# Patient Record
Sex: Female | Born: 1935
Health system: Southern US, Community
[De-identification: ages and names within clinical notes are randomized; demographics above are authoritative.]

## PROBLEM LIST (undated history)

## (undated) DIAGNOSIS — I10 Essential (primary) hypertension: Secondary | ICD-10-CM

## (undated) DIAGNOSIS — F419 Anxiety disorder, unspecified: Secondary | ICD-10-CM

## (undated) DIAGNOSIS — R519 Headache, unspecified: Secondary | ICD-10-CM

## (undated) DIAGNOSIS — F329 Major depressive disorder, single episode, unspecified: Secondary | ICD-10-CM

## (undated) DIAGNOSIS — F32A Depression, unspecified: Secondary | ICD-10-CM

## (undated) DIAGNOSIS — D691 Qualitative platelet defects: Secondary | ICD-10-CM

## (undated) DIAGNOSIS — R51 Headache: Secondary | ICD-10-CM

## (undated) DIAGNOSIS — K219 Gastro-esophageal reflux disease without esophagitis: Secondary | ICD-10-CM

## (undated) DIAGNOSIS — R32 Unspecified urinary incontinence: Secondary | ICD-10-CM

## (undated) DIAGNOSIS — M1990 Unspecified osteoarthritis, unspecified site: Secondary | ICD-10-CM

## (undated) DIAGNOSIS — M810 Age-related osteoporosis without current pathological fracture: Secondary | ICD-10-CM

## (undated) HISTORY — PX: APPENDECTOMY: SHX54

## (undated) HISTORY — DX: Age-related osteoporosis without current pathological fracture: M81.0

## (undated) HISTORY — PX: TONSILLECTOMY AND ADENOIDECTOMY: SHX28

## (undated) HISTORY — PX: SHOULDER SURGERY: SHX246

## (undated) HISTORY — DX: Headache: R51

## (undated) HISTORY — DX: Qualitative platelet defects: D69.1

## (undated) HISTORY — DX: Depression, unspecified: F32.A

## (undated) HISTORY — DX: Gastro-esophageal reflux disease without esophagitis: K21.9

## (undated) HISTORY — DX: Essential (primary) hypertension: I10

## (undated) HISTORY — PX: CERVICAL SPINE SURGERY: SHX589

## (undated) HISTORY — DX: Anxiety disorder, unspecified: F41.9

## (undated) HISTORY — DX: Unspecified urinary incontinence: R32

## (undated) HISTORY — DX: Major depressive disorder, single episode, unspecified: F32.9

## (undated) HISTORY — DX: Unspecified osteoarthritis, unspecified site: M19.90

## (undated) HISTORY — DX: Headache, unspecified: R51.9

## (undated) HISTORY — PX: BUNIONECTOMY: SHX129

---

## 2015-11-22 DIAGNOSIS — M5402 Panniculitis affecting regions of neck and back, cervical region: Secondary | ICD-10-CM | POA: Diagnosis not present

## 2015-11-22 DIAGNOSIS — M47812 Spondylosis without myelopathy or radiculopathy, cervical region: Secondary | ICD-10-CM | POA: Diagnosis not present

## 2015-11-22 DIAGNOSIS — M62830 Muscle spasm of back: Secondary | ICD-10-CM | POA: Diagnosis not present

## 2015-11-26 DIAGNOSIS — F329 Major depressive disorder, single episode, unspecified: Secondary | ICD-10-CM | POA: Diagnosis not present

## 2015-11-26 DIAGNOSIS — I10 Essential (primary) hypertension: Secondary | ICD-10-CM | POA: Diagnosis not present

## 2015-12-13 DIAGNOSIS — M5402 Panniculitis affecting regions of neck and back, cervical region: Secondary | ICD-10-CM | POA: Diagnosis not present

## 2015-12-13 DIAGNOSIS — M62830 Muscle spasm of back: Secondary | ICD-10-CM | POA: Diagnosis not present

## 2015-12-16 DIAGNOSIS — G894 Chronic pain syndrome: Secondary | ICD-10-CM | POA: Diagnosis not present

## 2015-12-16 DIAGNOSIS — M47812 Spondylosis without myelopathy or radiculopathy, cervical region: Secondary | ICD-10-CM | POA: Diagnosis not present

## 2015-12-16 DIAGNOSIS — M436 Torticollis: Secondary | ICD-10-CM | POA: Diagnosis not present

## 2015-12-16 DIAGNOSIS — M47892 Other spondylosis, cervical region: Secondary | ICD-10-CM | POA: Diagnosis not present

## 2015-12-16 DIAGNOSIS — M5402 Panniculitis affecting regions of neck and back, cervical region: Secondary | ICD-10-CM | POA: Diagnosis not present

## 2015-12-16 DIAGNOSIS — G248 Other dystonia: Secondary | ICD-10-CM | POA: Diagnosis not present

## 2015-12-16 DIAGNOSIS — M62830 Muscle spasm of back: Secondary | ICD-10-CM | POA: Diagnosis not present

## 2015-12-20 DIAGNOSIS — I10 Essential (primary) hypertension: Secondary | ICD-10-CM | POA: Diagnosis not present

## 2015-12-20 DIAGNOSIS — M542 Cervicalgia: Secondary | ICD-10-CM | POA: Diagnosis not present

## 2015-12-21 DIAGNOSIS — M62830 Muscle spasm of back: Secondary | ICD-10-CM | POA: Diagnosis not present

## 2015-12-21 DIAGNOSIS — M47812 Spondylosis without myelopathy or radiculopathy, cervical region: Secondary | ICD-10-CM | POA: Diagnosis not present

## 2015-12-27 DIAGNOSIS — S92351G Displaced fracture of fifth metatarsal bone, right foot, subsequent encounter for fracture with delayed healing: Secondary | ICD-10-CM | POA: Diagnosis not present

## 2016-01-10 DIAGNOSIS — M62838 Other muscle spasm: Secondary | ICD-10-CM | POA: Diagnosis not present

## 2016-01-10 DIAGNOSIS — M5481 Occipital neuralgia: Secondary | ICD-10-CM | POA: Diagnosis not present

## 2016-01-10 DIAGNOSIS — G243 Spasmodic torticollis: Secondary | ICD-10-CM | POA: Diagnosis not present

## 2016-01-10 DIAGNOSIS — M47892 Other spondylosis, cervical region: Secondary | ICD-10-CM | POA: Diagnosis not present

## 2016-01-10 DIAGNOSIS — M5402 Panniculitis affecting regions of neck and back, cervical region: Secondary | ICD-10-CM | POA: Diagnosis not present

## 2016-01-10 DIAGNOSIS — M47812 Spondylosis without myelopathy or radiculopathy, cervical region: Secondary | ICD-10-CM | POA: Diagnosis not present

## 2016-04-04 DIAGNOSIS — I10 Essential (primary) hypertension: Secondary | ICD-10-CM | POA: Diagnosis not present

## 2016-04-04 DIAGNOSIS — Z1231 Encounter for screening mammogram for malignant neoplasm of breast: Secondary | ICD-10-CM | POA: Diagnosis not present

## 2016-04-05 DIAGNOSIS — Z1231 Encounter for screening mammogram for malignant neoplasm of breast: Secondary | ICD-10-CM | POA: Diagnosis not present

## 2016-05-29 DIAGNOSIS — L814 Other melanin hyperpigmentation: Secondary | ICD-10-CM | POA: Diagnosis not present

## 2016-05-29 DIAGNOSIS — D1801 Hemangioma of skin and subcutaneous tissue: Secondary | ICD-10-CM | POA: Diagnosis not present

## 2016-06-28 DIAGNOSIS — M542 Cervicalgia: Secondary | ICD-10-CM | POA: Diagnosis not present

## 2016-06-28 DIAGNOSIS — F329 Major depressive disorder, single episode, unspecified: Secondary | ICD-10-CM | POA: Diagnosis not present

## 2016-06-28 DIAGNOSIS — I10 Essential (primary) hypertension: Secondary | ICD-10-CM | POA: Diagnosis not present

## 2016-06-28 LAB — CBC AND DIFFERENTIAL
HCT: 41 (ref 36–46)
Hemoglobin: 13.3 (ref 12.0–16.0)
Platelets: 162 (ref 150–399)
WBC: 7

## 2016-06-28 LAB — HEPATIC FUNCTION PANEL
ALT: 13 (ref 7–35)
AST: 21 (ref 13–35)
Alkaline Phosphatase: 47 (ref 25–125)
Bilirubin, Total: 0.7

## 2016-06-28 LAB — BASIC METABOLIC PANEL WITH GFR
BUN: 16 (ref 4–21)
Creatinine: 0.6 (ref 0.5–1.1)
Glucose: 92
Potassium: 4.7 (ref 3.4–5.3)
Sodium: 142 (ref 137–147)

## 2016-06-28 LAB — TSH: TSH: 1.32 (ref 0.41–5.90)

## 2016-07-26 DIAGNOSIS — I10 Essential (primary) hypertension: Secondary | ICD-10-CM | POA: Diagnosis not present

## 2016-07-26 DIAGNOSIS — J069 Acute upper respiratory infection, unspecified: Secondary | ICD-10-CM | POA: Diagnosis not present

## 2016-07-26 DIAGNOSIS — F329 Major depressive disorder, single episode, unspecified: Secondary | ICD-10-CM | POA: Diagnosis not present

## 2016-08-10 DIAGNOSIS — M47816 Spondylosis without myelopathy or radiculopathy, lumbar region: Secondary | ICD-10-CM | POA: Diagnosis not present

## 2016-08-10 DIAGNOSIS — M47812 Spondylosis without myelopathy or radiculopathy, cervical region: Secondary | ICD-10-CM | POA: Diagnosis not present

## 2016-08-16 DIAGNOSIS — Z23 Encounter for immunization: Secondary | ICD-10-CM | POA: Diagnosis not present

## 2016-09-07 DIAGNOSIS — L821 Other seborrheic keratosis: Secondary | ICD-10-CM | POA: Diagnosis not present

## 2016-10-11 DIAGNOSIS — F329 Major depressive disorder, single episode, unspecified: Secondary | ICD-10-CM | POA: Diagnosis not present

## 2016-10-11 DIAGNOSIS — I10 Essential (primary) hypertension: Secondary | ICD-10-CM | POA: Diagnosis not present

## 2016-10-11 DIAGNOSIS — L309 Dermatitis, unspecified: Secondary | ICD-10-CM | POA: Diagnosis not present

## 2016-10-11 DIAGNOSIS — H6121 Impacted cerumen, right ear: Secondary | ICD-10-CM | POA: Diagnosis not present

## 2016-10-11 DIAGNOSIS — M542 Cervicalgia: Secondary | ICD-10-CM | POA: Diagnosis not present

## 2017-02-08 DIAGNOSIS — F329 Major depressive disorder, single episode, unspecified: Secondary | ICD-10-CM | POA: Diagnosis not present

## 2017-02-08 DIAGNOSIS — I10 Essential (primary) hypertension: Secondary | ICD-10-CM | POA: Diagnosis not present

## 2017-02-08 DIAGNOSIS — M130 Polyarthritis, unspecified: Secondary | ICD-10-CM | POA: Diagnosis not present

## 2017-02-13 DIAGNOSIS — M1712 Unilateral primary osteoarthritis, left knee: Secondary | ICD-10-CM | POA: Diagnosis not present

## 2017-02-13 DIAGNOSIS — M25562 Pain in left knee: Secondary | ICD-10-CM | POA: Diagnosis not present

## 2017-03-07 DIAGNOSIS — H40023 Open angle with borderline findings, high risk, bilateral: Secondary | ICD-10-CM | POA: Diagnosis not present

## 2017-03-15 DIAGNOSIS — M1712 Unilateral primary osteoarthritis, left knee: Secondary | ICD-10-CM | POA: Diagnosis not present

## 2017-05-17 DIAGNOSIS — L570 Actinic keratosis: Secondary | ICD-10-CM | POA: Diagnosis not present

## 2017-05-17 DIAGNOSIS — R51 Headache: Secondary | ICD-10-CM | POA: Diagnosis not present

## 2017-05-17 DIAGNOSIS — F329 Major depressive disorder, single episode, unspecified: Secondary | ICD-10-CM | POA: Diagnosis not present

## 2017-05-17 DIAGNOSIS — I10 Essential (primary) hypertension: Secondary | ICD-10-CM | POA: Diagnosis not present

## 2017-05-17 DIAGNOSIS — Z1231 Encounter for screening mammogram for malignant neoplasm of breast: Secondary | ICD-10-CM | POA: Diagnosis not present

## 2017-05-17 DIAGNOSIS — M810 Age-related osteoporosis without current pathological fracture: Secondary | ICD-10-CM | POA: Diagnosis not present

## 2017-06-07 DIAGNOSIS — M25562 Pain in left knee: Secondary | ICD-10-CM | POA: Diagnosis not present

## 2017-06-07 DIAGNOSIS — M1712 Unilateral primary osteoarthritis, left knee: Secondary | ICD-10-CM | POA: Diagnosis not present

## 2017-06-11 DIAGNOSIS — L570 Actinic keratosis: Secondary | ICD-10-CM | POA: Diagnosis not present

## 2017-06-11 DIAGNOSIS — L821 Other seborrheic keratosis: Secondary | ICD-10-CM | POA: Diagnosis not present

## 2017-06-11 DIAGNOSIS — L814 Other melanin hyperpigmentation: Secondary | ICD-10-CM | POA: Diagnosis not present

## 2017-06-12 DIAGNOSIS — M810 Age-related osteoporosis without current pathological fracture: Secondary | ICD-10-CM | POA: Diagnosis not present

## 2017-06-12 DIAGNOSIS — Z1231 Encounter for screening mammogram for malignant neoplasm of breast: Secondary | ICD-10-CM | POA: Diagnosis not present

## 2017-06-12 DIAGNOSIS — M8589 Other specified disorders of bone density and structure, multiple sites: Secondary | ICD-10-CM | POA: Diagnosis not present

## 2017-06-12 LAB — HM MAMMOGRAPHY

## 2017-06-12 LAB — HM DEXA SCAN

## 2017-09-04 ENCOUNTER — Ambulatory Visit (INDEPENDENT_AMBULATORY_CARE_PROVIDER_SITE_OTHER): Payer: Medicare Other | Admitting: Adult Health

## 2017-09-04 ENCOUNTER — Encounter: Payer: Self-pay | Admitting: Adult Health

## 2017-09-04 VITALS — BP 146/64 | Temp 98.6°F | Ht 62.5 in | Wt 126.0 lb

## 2017-09-04 DIAGNOSIS — R51 Headache: Secondary | ICD-10-CM

## 2017-09-04 DIAGNOSIS — Z0000 Encounter for general adult medical examination without abnormal findings: Secondary | ICD-10-CM

## 2017-09-04 DIAGNOSIS — F32A Depression, unspecified: Secondary | ICD-10-CM | POA: Insufficient documentation

## 2017-09-04 DIAGNOSIS — F329 Major depressive disorder, single episode, unspecified: Secondary | ICD-10-CM | POA: Diagnosis not present

## 2017-09-04 DIAGNOSIS — F419 Anxiety disorder, unspecified: Secondary | ICD-10-CM

## 2017-09-04 DIAGNOSIS — Z23 Encounter for immunization: Secondary | ICD-10-CM | POA: Diagnosis not present

## 2017-09-04 DIAGNOSIS — K2100 Gastro-esophageal reflux disease with esophagitis, without bleeding: Secondary | ICD-10-CM

## 2017-09-04 DIAGNOSIS — R519 Headache, unspecified: Secondary | ICD-10-CM | POA: Insufficient documentation

## 2017-09-04 DIAGNOSIS — I10 Essential (primary) hypertension: Secondary | ICD-10-CM | POA: Diagnosis not present

## 2017-09-04 DIAGNOSIS — K219 Gastro-esophageal reflux disease without esophagitis: Secondary | ICD-10-CM | POA: Insufficient documentation

## 2017-09-04 MED ORDER — CALCIUM CARB-CHOLECALCIFEROL 600-800 MG-UNIT PO TABS: 2.0000 | ORAL_TABLET | Freq: Every day | ORAL | 3 refills | Status: DC

## 2017-09-04 MED ORDER — METOPROLOL SUCCINATE ER 50 MG PO TB24: 50.0000 mg | ORAL_TABLET | Freq: Every day | ORAL | 3 refills | Status: DC

## 2017-09-04 MED ORDER — ESCITALOPRAM OXALATE 20 MG PO TABS: 20.0000 mg | ORAL_TABLET | Freq: Every day | ORAL | 1 refills | Status: DC

## 2017-09-04 MED ORDER — MELOXICAM 15 MG PO TABS: 15.0000 mg | ORAL_TABLET | Freq: Every day | ORAL | 3 refills | Status: DC

## 2017-09-04 MED ORDER — BUTALBITAL-APAP-CAFFEINE 50-325-40 MG PO TABS: 1.0000 | ORAL_TABLET | Freq: Four times a day (QID) | ORAL | 0 refills | Status: DC | PRN

## 2017-09-04 MED ORDER — OMEPRAZOLE 20 MG PO CPDR: 20.0000 mg | DELAYED_RELEASE_CAPSULE | Freq: Every day | ORAL | 3 refills | Status: DC

## 2017-09-04 NOTE — Progress Notes (Signed)
Patient presents to clinic today to establish care. She is a pleasant 83 year old female who  has a past medical history of Anxiety and depression; GERD (gastroesophageal reflux disease); and Hypertension.   She recently moved from [place] to be closer to family.   She is unsure of when her last physical was.    Acute Concerns: Establish Care   Chronic Issues:  Anxiety and Depression - takes Lexapo 10 mg daily. She feels that since moving her anxiety and depression has become worse. She would like to go up on her dose   GERD - Prilosec 20 mg daily - stable   Hypertension - Controlled with Norvasc 5 mg and Toprol 50 mg - BP in the office today 146/64   Urinary Incontinence - has to use a pad at night   Health Maintenance: Dental -- Routine  Vision -- Routine  Immunizations -- UTD  Colonoscopy -- No longer needs Mammogram -- 2017  PAP -- No longer needs  Bone Density -- Unknown  Diet: She eats healthy  Exercise: She is active and walks 4 times a day with her dog.   Is followed by   - Dr. Alvan Dame - orthopedics    Past Medical History:  Diagnosis Date  . Anxiety and depression   . GERD (gastroesophageal reflux disease)   . Hypertension     Past Surgical History:  Procedure Laterality Date  . APPENDECTOMY    . BUNIONECTOMY    . CERVICAL SPINE SURGERY    . SHOULDER SURGERY    . TONSILLECTOMY AND ADENOIDECTOMY      No current outpatient prescriptions on file prior to visit.   No current facility-administered medications on file prior to visit.     Allergies  Allergen Reactions  . Penicillins Rash    Family History  Problem Relation Age of Onset  . Alcohol abuse Father     Social History   Social History  . Marital status: Widowed    Spouse name: N/A  . Number of children: N/A  . Years of education: N/A   Occupational History  . Not on file.   Social History Main Topics  . Smoking status: Former Smoker    Years: 10.00  . Smokeless  tobacco: Never Used  . Alcohol use 6.0 oz/week    10 Glasses of wine per week  . Drug use: Unknown  . Sexual activity: Not on file   Other Topics Concern  . Not on file   Social History Narrative  . No narrative on file    Review of Systems  Constitutional: Negative.   HENT: Negative.   Respiratory: Negative.   Cardiovascular: Negative.   Genitourinary: Negative.   Musculoskeletal: Positive for back pain, joint pain and neck pain. Negative for falls and myalgias.  Neurological: Negative.   Psychiatric/Behavioral: Negative.   All other systems reviewed and are negative.   BP (!) 146/64 (BP Location: Right Arm)   Temp 98.6 F (37 C) (Oral)   Ht 5' 2.5" (1.588 m)   Wt 126 lb (57.2 kg)   BMI 22.68 kg/m   Physical Exam  Constitutional: She is oriented to person, place, and time and well-developed, well-nourished, and in no distress. No distress.  Neck: Normal range of motion. Neck supple. No thyromegaly present.  Cardiovascular: Normal rate, regular rhythm, normal heart sounds and intact distal pulses.  Exam reveals no gallop and no friction rub.   No murmur heard. Pulmonary/Chest: Effort normal and breath  sounds normal. No respiratory distress. She has no wheezes. She has no rales. She exhibits no tenderness.  Musculoskeletal: Normal range of motion. She exhibits no edema or deformity.  Lymphadenopathy:    She has no cervical adenopathy.  Neurological: She is alert and oriented to person, place, and time. Gait normal. GCS score is 15.  Skin: Skin is warm and dry. No rash noted. She is not diaphoretic. No erythema. No pallor.  Nursing note and vitals reviewed.    Assessment/Plan: 1. Encounter for medical examination to establish care - Follow up for yearly exam  - Follow up sooner if needed - Continue to stay active and eat healthy   2. Essential hypertension - No change in medication at this tme  - amLODipine (NORVASC) 5 MG tablet; Take 5 mg by mouth daily. -  metoprolol succinate (TOPROL-XL) 50 MG 24 hr tablet; Take 1 tablet (50 mg total) by mouth daily. Take with or immediately following a meal.  Dispense: 90 tablet; Refill: 3  3. Need for prophylactic vaccination and inoculation against influenza  - Flu vaccine HIGH DOSE PF (Fluzone High dose)  4. Gastroesophageal reflux disease with esophagitis  - omeprazole (PRILOSEC) 20 MG capsule; Take 1 capsule (20 mg total) by mouth daily.  Dispense: 90 capsule; Refill: 3  5. Frequent headaches  - butalbital-acetaminophen-caffeine (FIORICET, ESGIC) 50-325-40 MG tablet; Take 1 tablet by mouth every 6 (six) hours as needed for headache. Take 1 tablet four times daily as needed  Dispense: 14 tablet; Refill: 0  6. Anxiety and depression - Increase Lexapro to 20 mg daily  - escitalopram (LEXAPRO) 20 MG tablet; Take 1 tablet (20 mg total) by mouth daily.  Dispense: 90 tablet; Refill: 1  Dorothyann Peng, NP

## 2017-09-17 DIAGNOSIS — H26493 Other secondary cataract, bilateral: Secondary | ICD-10-CM | POA: Diagnosis not present

## 2017-09-17 DIAGNOSIS — Z961 Presence of intraocular lens: Secondary | ICD-10-CM | POA: Diagnosis not present

## 2017-09-17 DIAGNOSIS — H524 Presbyopia: Secondary | ICD-10-CM | POA: Diagnosis not present

## 2017-09-17 DIAGNOSIS — H5213 Myopia, bilateral: Secondary | ICD-10-CM | POA: Diagnosis not present

## 2017-09-26 ENCOUNTER — Encounter: Payer: Self-pay | Admitting: Family Medicine

## 2017-10-03 ENCOUNTER — Ambulatory Visit: Payer: Medicare Other | Admitting: Adult Health

## 2017-10-04 DIAGNOSIS — G8929 Other chronic pain: Secondary | ICD-10-CM | POA: Diagnosis not present

## 2017-10-04 DIAGNOSIS — M1712 Unilateral primary osteoarthritis, left knee: Secondary | ICD-10-CM | POA: Diagnosis not present

## 2017-10-04 DIAGNOSIS — M25562 Pain in left knee: Secondary | ICD-10-CM | POA: Diagnosis not present

## 2017-10-05 ENCOUNTER — Encounter: Payer: Self-pay | Admitting: Adult Health

## 2017-10-05 ENCOUNTER — Other Ambulatory Visit: Payer: Self-pay | Admitting: Adult Health

## 2017-10-05 ENCOUNTER — Ambulatory Visit (INDEPENDENT_AMBULATORY_CARE_PROVIDER_SITE_OTHER): Payer: Medicare Other | Admitting: Adult Health

## 2017-10-05 VITALS — BP 126/60 | Temp 98.2°F | Wt 126.0 lb

## 2017-10-05 DIAGNOSIS — F329 Major depressive disorder, single episode, unspecified: Secondary | ICD-10-CM | POA: Diagnosis not present

## 2017-10-05 DIAGNOSIS — Z760 Encounter for issue of repeat prescription: Secondary | ICD-10-CM

## 2017-10-05 DIAGNOSIS — R51 Headache: Secondary | ICD-10-CM | POA: Diagnosis not present

## 2017-10-05 DIAGNOSIS — E018 Other iodine-deficiency related thyroid disorders and allied conditions: Secondary | ICD-10-CM

## 2017-10-05 DIAGNOSIS — R7989 Other specified abnormal findings of blood chemistry: Secondary | ICD-10-CM

## 2017-10-05 DIAGNOSIS — F419 Anxiety disorder, unspecified: Secondary | ICD-10-CM | POA: Diagnosis not present

## 2017-10-05 DIAGNOSIS — I10 Essential (primary) hypertension: Secondary | ICD-10-CM | POA: Diagnosis not present

## 2017-10-05 DIAGNOSIS — R519 Headache, unspecified: Secondary | ICD-10-CM

## 2017-10-05 DIAGNOSIS — F32A Depression, unspecified: Secondary | ICD-10-CM

## 2017-10-05 LAB — CBC WITH DIFFERENTIAL/PLATELET
BASOS ABS: 0 10*3/uL (ref 0.0–0.1)
Basophils Relative: 0.2 % (ref 0.0–3.0)
Eosinophils Absolute: 0 10*3/uL (ref 0.0–0.7)
Eosinophils Relative: 0 % (ref 0.0–5.0)
HCT: 40.6 % (ref 36.0–46.0)
Hemoglobin: 13.5 g/dL (ref 12.0–15.0)
LYMPHS ABS: 0.8 10*3/uL (ref 0.7–4.0)
Lymphocytes Relative: 12.1 % (ref 12.0–46.0)
MCHC: 33.2 g/dL (ref 30.0–36.0)
MCV: 99.4 fl (ref 78.0–100.0)
MONO ABS: 0.2 10*3/uL (ref 0.1–1.0)
MONOS PCT: 3.1 % (ref 3.0–12.0)
NEUTROS PCT: 84.6 % — AB (ref 43.0–77.0)
Neutro Abs: 5.5 10*3/uL (ref 1.4–7.7)
Platelets: 143 10*3/uL — ABNORMAL LOW (ref 150.0–400.0)
RBC: 4.08 Mil/uL (ref 3.87–5.11)
RDW: 13.1 % (ref 11.5–15.5)
WBC: 6.5 10*3/uL (ref 4.0–10.5)

## 2017-10-05 LAB — LIPID PANEL
CHOL/HDL RATIO: 2
Cholesterol: 266 mg/dL — ABNORMAL HIGH (ref 0–200)
HDL: 120.4 mg/dL (ref >39.00)
LDL Cholesterol: 134 mg/dL — ABNORMAL HIGH (ref 0–99)
NONHDL: 145.51
Triglycerides: 59 mg/dL (ref 0.0–149.0)
VLDL: 11.8 mg/dL (ref 0.0–40.0)

## 2017-10-05 LAB — BASIC METABOLIC PANEL
BUN: 26 mg/dL — ABNORMAL HIGH (ref 6–23)
CHLORIDE: 103 meq/L (ref 96–112)
CO2: 28 meq/L (ref 19–32)
Calcium: 9.6 mg/dL (ref 8.4–10.5)
Creatinine, Ser: 0.75 mg/dL (ref 0.40–1.20)
GFR: 78.76 mL/min (ref >60.00)
Glucose, Bld: 127 mg/dL — ABNORMAL HIGH (ref 70–99)
POTASSIUM: 4.8 meq/L (ref 3.5–5.1)
SODIUM: 141 meq/L (ref 135–145)

## 2017-10-05 LAB — HEPATIC FUNCTION PANEL
ALK PHOS: 47 U/L (ref 39–117)
ALT: 16 U/L (ref 0–35)
AST: 23 U/L (ref 0–37)
Albumin: 4.5 g/dL (ref 3.5–5.2)
BILIRUBIN DIRECT: 0.1 mg/dL (ref 0.0–0.3)
TOTAL PROTEIN: 6.7 g/dL (ref 6.0–8.3)
Total Bilirubin: 0.5 mg/dL (ref 0.2–1.2)

## 2017-10-05 LAB — TSH: TSH: 0.33 u[IU]/mL — ABNORMAL LOW (ref 0.35–4.50)

## 2017-10-05 MED ORDER — AMLODIPINE BESYLATE 5 MG PO TABS: 5.0000 mg | ORAL_TABLET | Freq: Every day | ORAL | 3 refills | Status: DC

## 2017-10-05 MED ORDER — BUTALBITAL-APAP-CAFFEINE 50-325-40 MG PO TABS: 1.0000 | ORAL_TABLET | Freq: Four times a day (QID) | ORAL | 0 refills | Status: DC | PRN

## 2017-10-05 MED ORDER — MIRTAZAPINE 15 MG PO TABS: 15.0000 mg | ORAL_TABLET | Freq: Every day | ORAL | 1 refills | Status: DC

## 2017-10-05 MED ORDER — ESCITALOPRAM OXALATE 20 MG PO TABS: 20.0000 mg | ORAL_TABLET | Freq: Every day | ORAL | 1 refills | Status: DC

## 2017-10-05 NOTE — Progress Notes (Signed)
Subjective:    Patient ID: Sheila Wolf, female    DOB: 07-26-1934, 83 y.o.   MRN: [id number]  HPI  83 year old female who  has a past medical history of Anxiety and depression, Arthritis, Frequent headaches, GERD (gastroesophageal reflux disease), Hypertension, Thrombocytopathia ([place]), and Urinary incontinence. She presents to the office today for her yearly follow up exam    Regarding anxiety and depression. During her last visit she had just moved from [place] to be closer to her family. The move had caused increase stress which increased her anxiety and depression. The decision to was made to increase Lexapro from 10 mg to 20 mg Today in the office she reports that she has been feeling improved, she has not experienced any side effects from increasing this medication  For control of blood pressure she takes Norvasc 5 mg daily and Toporol 25 mg  All immunizations and health maintenance protocols were reviewed with the patient and needed orders were placed.  Appropriate screening laboratory values were ordered for the patient including screening of hyperlipidemia, renal function and hepatic function.  Medication reconciliation,  past medical history, social history, problem list and allergies were reviewed in detail with the patient  Goals were established with regard to weight loss, exercise, and  diet in compliance with medications  End of life planning was discussed. She has an advanced directive and living will.   She has no acute complaints today      Review of Systems  Constitutional: Negative.   Eyes: Negative.   Respiratory: Negative.   Cardiovascular: Negative.   Gastrointestinal: Negative.   Endocrine: Negative.   Genitourinary: Negative.   Musculoskeletal: Positive for arthralgias and back pain.  Skin: Negative.   Allergic/Immunologic: Negative.   Neurological: Negative.   Hematological: Negative.   All other systems reviewed and are  negative.     Past Medical History:  Diagnosis Date  . Anxiety and depression   . Arthritis   . Frequent headaches   . GERD (gastroesophageal reflux disease)   . Hypertension   . Thrombocytopathia ([place])   . Urinary incontinence     Social History   Socioeconomic History  . Marital status: Widowed    Spouse name: Not on file  . Number of children: Not on file  . Years of education: Not on file  . Highest education level: Not on file  Social Needs  . Financial resource strain: Not on file  . Food insecurity - worry: Not on file  . Food insecurity - inability: Not on file  . Transportation needs - medical: Not on file  . Transportation needs - non-medical: Not on file  Occupational History  . Not on file  Tobacco Use  . Smoking status: Former Smoker    Years: 10.00    Types: Cigarettes  . Smokeless tobacco: Never Used  Substance and Sexual Activity  . Alcohol use: Yes    Alcohol/week: 6.0 oz    Types: 10 Glasses of wine per week  . Drug use: No  . Sexual activity: No  Other Topics Concern  . Not on file  Social History Narrative  . Not on file    Past Surgical History:  Procedure Laterality Date  . APPENDECTOMY    . BUNIONECTOMY    . CERVICAL SPINE SURGERY    . SHOULDER SURGERY    . TONSILLECTOMY AND ADENOIDECTOMY      Family History  Problem Relation Age of Onset  . Arthritis Mother   .  Hearing loss Mother   . Heart disease Mother   . Hypertension Mother   . Alcohol abuse Father   . Early death Father   . Heart disease Father   . Stroke Father   . Birth defects Brother   . Heart disease Brother     Allergies  Allergen Reactions  . Penicillins Rash    Current Outpatient Medications on File Prior to Visit  Medication Sig Dispense Refill  . amLODipine (NORVASC) 5 MG tablet Take 5 mg by mouth daily.    . butalbital-acetaminophen-caffeine (FIORICET, ESGIC) 50-325-40 MG tablet Take 1 tablet by mouth every 6 (six) hours as needed for headache.  Take 1 tablet four times daily as needed 14 tablet 0  . Calcium Carb-Cholecalciferol (CALTRATE 600+D) 600-800 MG-UNIT TABS Take 2 tablets by mouth daily. 60 tablet 3  . cetirizine (ZYRTEC) 10 MG tablet Take 10 mg by mouth daily.    Marland Kitchen escitalopram (LEXAPRO) 20 MG tablet Take 1 tablet (20 mg total) by mouth daily. (Patient taking differently: Take 40 mg daily by mouth. ) 90 tablet 1  . Fluticasone Furoate 50 MCG/ACT AEPB Inhale into the lungs.    . meloxicam (MOBIC) 15 MG tablet Take 1 tablet (15 mg total) by mouth daily. 90 tablet 3  . metoprolol succinate (TOPROL-XL) 50 MG 24 hr tablet Take 1 tablet (50 mg total) by mouth daily. Take with or immediately following a meal. 90 tablet 3  . mirtazapine (REMERON) 15 MG tablet Take 15 mg at bedtime by mouth.    Marland Kitchen omeprazole (PRILOSEC) 20 MG capsule Take 1 capsule (20 mg total) by mouth daily. 90 capsule 3   No current facility-administered medications on file prior to visit.     BP 126/60 (BP Location: Left Arm)   Temp 98.2 F (36.8 C) (Oral)   Wt 126 lb (57.2 kg)   BMI 22.68 kg/m       Objective:   Physical Exam  Constitutional: She is oriented to person, place, and time. She appears well-developed and well-nourished. No distress.  HENT:  Head: Normocephalic and atraumatic.  Right Ear: External ear normal.  Left Ear: External ear normal.  Nose: Nose normal.  Mouth/Throat: Oropharynx is clear and moist. No oropharyngeal exudate.  Eyes: Conjunctivae and EOM are normal. Pupils are equal, round, and reactive to light. Right eye exhibits no discharge. Left eye exhibits no discharge.  Neck: Normal range of motion. Neck supple. No JVD present. No tracheal deviation present. No thyromegaly present.  Cardiovascular: Normal rate, regular rhythm, normal heart sounds and intact distal pulses. Exam reveals no gallop and no friction rub.  No murmur heard. Pulmonary/Chest: Effort normal and breath sounds normal. No stridor. No respiratory distress. She  has no wheezes. She has no rales. She exhibits no tenderness.  Abdominal: Soft. Bowel sounds are normal. She exhibits no distension and no mass. There is no tenderness. There is no rebound and no guarding.  Genitourinary:  Genitourinary Comments: Deferred    Musculoskeletal: Normal range of motion. She exhibits no edema, tenderness or deformity.  Lymphadenopathy:    She has no cervical adenopathy.  Neurological: She is alert and oriented to person, place, and time. She has normal reflexes. She displays normal reflexes. No cranial nerve deficit. She exhibits normal muscle tone. Coordination normal.  Skin: Skin is warm and dry. No rash noted. She is not diaphoretic. No erythema. No pallor.  Psychiatric: She has a normal mood and affect. Her behavior is normal. Judgment and thought content  normal.  Nursing note and vitals reviewed.     Assessment & Plan:  1. Anxiety and depression - She is doing well on 20 mg daily. Follow up in 6 months or sooner if needed - escitalopram (LEXAPRO) 20 MG tablet; Take 1 tablet (20 mg total) daily by mouth.  Dispense: 90 tablet; Refill: 1  2. Essential hypertension - Well controlled. No change in medication  - amLODipine (NORVASC) 5 MG tablet; Take 1 tablet (5 mg total) daily by mouth.  Dispense: 90 tablet; Refill: 3 - CBC with Differential/Platelet - Basic metabolic panel - Hepatic function panel - Lipid panel - TSH  3. Frequent headaches  - butalbital-acetaminophen-caffeine (FIORICET, ESGIC) 50-325-40 MG tablet; Take 1 tablet every 6 (six) hours as needed by mouth for headache. Take 1 tablet four times daily as needed  Dispense: 30 tablet; Refill: 0  4. Medication refill  - Fluticasone Furoate 50 MCG/ACT AEPB; Inhale into the lungs. - escitalopram (LEXAPRO) 20 MG tablet; Take 1 tablet (20 mg total) daily by mouth.  Dispense: 90 tablet; Refill: 1 - amLODipine (NORVASC) 5 MG tablet; Take 1 tablet (5 mg total) daily by mouth.  Dispense: 90 tablet;  Refill: 3 - mirtazapine (REMERON) 15 MG tablet; Take 1 tablet (15 mg total) at bedtime by mouth.  Dispense: 90 tablet; Refill: 1  Dorothyann Peng, NP

## 2017-10-05 NOTE — Patient Instructions (Signed)
It was great seeing you this morning.   I will follow up with you regarding your blood work.   Please let me know if you need anything

## 2017-10-08 ENCOUNTER — Encounter: Payer: Self-pay | Admitting: Adult Health

## 2017-11-08 ENCOUNTER — Other Ambulatory Visit: Payer: Self-pay | Admitting: Adult Health

## 2017-11-08 NOTE — Telephone Encounter (Signed)
Medication listed in chart is Veramyst.  Not Flonase. Ok to send?

## 2017-11-08 NOTE — Telephone Encounter (Signed)
Same medication

## 2017-11-08 NOTE — Telephone Encounter (Signed)
Ok to prescribe flonase

## 2017-11-08 NOTE — Telephone Encounter (Signed)
Sheila Wolf has not prescribed for the pt.  Will send for authorization.

## 2017-11-08 NOTE — Telephone Encounter (Signed)
Copied from [place] [id number]. Topic: Quick Communication - See Telephone Encounter >> Nov 08, 2017  9:45 AM Conception Chancy, NT wrote: CRM for notification. See Telephone encounter for:  11/08/17.  Patient is wanting a prescription for Flonase or the generic version. Please send to Pharmacy on file.

## 2017-11-12 MED ORDER — FLUTICASONE PROPIONATE 50 MCG/ACT NA SUSP: 2.0000 | Freq: Every day | NASAL | 6 refills | Status: DC

## 2017-11-12 NOTE — Telephone Encounter (Signed)
Medication filled to pharmacy as requested.   

## 2017-12-04 ENCOUNTER — Encounter: Payer: Self-pay | Admitting: Adult Health

## 2017-12-04 ENCOUNTER — Ambulatory Visit (INDEPENDENT_AMBULATORY_CARE_PROVIDER_SITE_OTHER): Payer: Medicare Other | Admitting: Adult Health

## 2017-12-04 VITALS — BP 110/50 | HR 78 | Temp 98.3°F | Ht 62.5 in | Wt 123.2 lb

## 2017-12-04 DIAGNOSIS — J302 Other seasonal allergic rhinitis: Secondary | ICD-10-CM | POA: Diagnosis not present

## 2017-12-04 DIAGNOSIS — E018 Other iodine-deficiency related thyroid disorders and allied conditions: Secondary | ICD-10-CM

## 2017-12-04 DIAGNOSIS — Z760 Encounter for issue of repeat prescription: Secondary | ICD-10-CM | POA: Diagnosis not present

## 2017-12-04 LAB — TSH: TSH: 1.3 u[IU]/mL (ref 0.35–4.50)

## 2017-12-04 MED ORDER — BUTALBITAL-APAP-CAFFEINE 50-325-40 MG PO TABS: 1.0000 | ORAL_TABLET | Freq: Four times a day (QID) | ORAL | 0 refills | Status: DC | PRN

## 2017-12-04 MED ORDER — FLUTICASONE PROPIONATE 50 MCG/ACT NA SUSP: 2.0000 | Freq: Every day | NASAL | 6 refills | Status: DC

## 2017-12-04 MED ORDER — OMEPRAZOLE 20 MG PO CPDR
20.0000 mg | DELAYED_RELEASE_CAPSULE | Freq: Every day | ORAL | 3 refills | Status: DC
Start: 2017-12-04 — End: 2020-06-08

## 2017-12-04 NOTE — Progress Notes (Signed)
Subjective:    Patient ID: Sheila Wolf, female    DOB: 10/02/1934, 84 y.o.   MRN: [id number]  HPI  84 year old female who  has a past medical history of Anxiety and depression, Arthritis, Frequent headaches, GERD (gastroesophageal reflux disease), Hypertension, Osteoporosis, Thrombocytopathia ([place]), and Urinary incontinence. She presents today for an acute issue of "stuffy nose and clear rhinorrhea. She has had her symptoms for 2-3 weeks. Recently restarted Flonase and is taking Zyrtec.   She denies any fevers, sinus pain/pressure, or feeling ill.    Review of Systems See HPI   Past Medical History:  Diagnosis Date  . Anxiety and depression   . Arthritis   . Frequent headaches   . GERD (gastroesophageal reflux disease)   . Hypertension   . Osteoporosis   . 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 Urinary incontinence     Social History   Socioeconomic History  . Marital status: Widowed    Spouse name: Not on file  . Number of children: Not on file  . Years of education: Not on file  . Highest education level: Not on file  Social Needs  . Financial resource strain: Not on file  . Food insecurity - worry: Not on file  . Food insecurity - inability: Not on file  . Transportation needs - medical: Not on file  . Transportation needs - non-medical: Not on file  Occupational History  . Not on file  Tobacco Use  . Smoking status: Former Smoker    Years: 10.00    Types: Cigarettes  . Smokeless tobacco: Never Used  Substance and Sexual Activity  . Alcohol use: Yes    Alcohol/week: 6.0 oz    Types: 10 Glasses of wine per week  . Drug use: No  . Sexual activity: No  Other Topics Concern  . Not on file  Social History Narrative  . Not on file    Past Surgical History:  Procedure Laterality Date  . APPENDECTOMY    . BUNIONECTOMY    . CERVICAL SPINE SURGERY    . SHOULDER SURGERY    . TONSILLECTOMY AND ADENOIDECTOMY      Family History  Problem Relation Age of  Onset  . Arthritis Mother   . Hearing loss Mother   . Heart disease Mother   . Hypertension Mother   . Alcohol abuse Father   . Early death Father   . Heart disease Father   . Stroke Father   . Birth defects Brother   . Heart disease Brother     Allergies  Allergen Reactions  . Penicillins Rash    Current Outpatient Medications on File Prior to Visit  Medication Sig Dispense Refill  . amLODipine (NORVASC) 5 MG tablet Take 1 tablet (5 mg total) daily by mouth. 90 tablet 3  . butalbital-acetaminophen-caffeine (FIORICET, ESGIC) 50-325-40 MG tablet Take 1 tablet every 6 (six) hours as needed by mouth for headache. Take 1 tablet four times daily as needed 30 tablet 0  . Calcium Carb-Cholecalciferol (CALTRATE 600+D) 600-800 MG-UNIT TABS Take 2 tablets by mouth daily. 60 tablet 3  . cetirizine (ZYRTEC) 10 MG tablet Take 10 mg by mouth daily.    Marland Kitchen escitalopram (LEXAPRO) 20 MG tablet Take 1 tablet (20 mg total) daily by mouth. 90 tablet 1  . fluticasone (FLONASE) 50 MCG/ACT nasal spray Place 2 sprays into both nostrils daily. 16 g 6  . Fluticasone Furoate 50 MCG/ACT AEPB Inhale into the lungs.    Marland Kitchen  meloxicam (MOBIC) 15 MG tablet Take 1 tablet (15 mg total) by mouth daily. 90 tablet 3  . metoprolol succinate (TOPROL-XL) 50 MG 24 hr tablet Take 1 tablet (50 mg total) by mouth daily. Take with or immediately following a meal. 90 tablet 3  . mirtazapine (REMERON) 15 MG tablet Take 1 tablet (15 mg total) at bedtime by mouth. 90 tablet 1  . omeprazole (PRILOSEC) 20 MG capsule Take 1 capsule (20 mg total) by mouth daily. 90 capsule 3   No current facility-administered medications on file prior to visit.     BP (!) 110/50   Pulse 78   Temp 98.3 F (36.8 C) (Oral)   Ht 5' 2.5" (1.588 m)   Wt 123 lb 3.2 oz (55.9 kg)   BMI 22.17 kg/m       Objective:   Physical Exam  Constitutional: She appears well-developed and well-nourished.  HENT:  Head: Normocephalic and atraumatic.  Right Ear:  Hearing, tympanic membrane, external ear and ear canal normal.  Left Ear: Hearing, tympanic membrane, external ear and ear canal normal.  Nose: No mucosal edema or rhinorrhea. Right sinus exhibits no maxillary sinus tenderness and no frontal sinus tenderness. Left sinus exhibits no maxillary sinus tenderness and no frontal sinus tenderness.  Mouth/Throat: Uvula is midline, oropharynx is clear and moist and mucous membranes are normal. No oropharyngeal exudate.  Cardiovascular: Normal rate, regular rhythm, normal heart sounds and intact distal pulses. Exam reveals no friction rub.  No murmur heard. Pulmonary/Chest: Effort normal and breath sounds normal. No respiratory distress. She has no wheezes. She has no rales. She exhibits no tenderness.  Skin: Skin is warm and dry. No rash noted. She is not diaphoretic. No erythema. No pallor.  Psychiatric: She has a normal mood and affect. Her behavior is normal. Thought content normal.  Vitals reviewed.     Assessment & Plan:   1. Seasonal allergies - Continue with Flonase  - Continue with Zyrtec - Follow up if no improvement   2. Medication refill  - fluticasone (FLONASE) 50 MCG/ACT nasal spray; Place 2 sprays into both nostrils daily.  Dispense: 16 g; Refill: 6 - omeprazole (PRILOSEC) 20 MG capsule; Take 1 capsule (20 mg total) by mouth daily.  Dispense: 90 capsule; Refill: 3 - butalbital-acetaminophen-caffeine (FIORICET, ESGIC) 50-325-40 MG tablet; Take 1 tablet by mouth every 6 (six) hours as needed for headache. Take 1 tablet four times daily as needed  Dispense: 30 tablet; Refill: 0  3. Other iodine-deficiency related thyroid disorders and allied conditions  - TSH  Dorothyann Peng, NP

## 2017-12-27 ENCOUNTER — Encounter: Payer: Self-pay | Admitting: Family Medicine

## 2017-12-27 ENCOUNTER — Ambulatory Visit (INDEPENDENT_AMBULATORY_CARE_PROVIDER_SITE_OTHER): Payer: Medicare Other | Admitting: Family Medicine

## 2017-12-27 VITALS — BP 120/76 | HR 81 | Temp 99.7°F | Ht 62.5 in | Wt 122.1 lb

## 2017-12-27 DIAGNOSIS — J101 Influenza due to other identified influenza virus with other respiratory manifestations: Secondary | ICD-10-CM

## 2017-12-27 DIAGNOSIS — R6889 Other general symptoms and signs: Secondary | ICD-10-CM

## 2017-12-27 LAB — POC INFLUENZA A&B (BINAX/QUICKVUE)
INFLUENZA A, POC: POSITIVE — AB
INFLUENZA B, POC: NEGATIVE

## 2017-12-27 MED ORDER — OSELTAMIVIR PHOSPHATE 75 MG PO CAPS: 75.0000 mg | ORAL_CAPSULE | Freq: Two times a day (BID) | ORAL | 0 refills | Status: DC

## 2017-12-27 NOTE — Patient Instructions (Signed)
I sent tamiflu to the pharmacy.    Influenza, Adult Influenza ("the flu") is an infection in the lungs, nose, and throat (respiratory tract). It is caused by a virus. The flu causes many common cold symptoms, as well as a high fever and body aches. It can make you feel very sick. The flu spreads easily from person to person (is contagious). Getting a flu shot (influenza vaccination) every year is the best way to prevent the flu. Follow these instructions at home:  Take over-the-counter and prescription medicines only as told by your doctor.  Use a cool mist humidifier to add moisture (humidity) to the air in your home. This can make it easier to breathe.  Rest as needed.  Drink enough fluid to keep your pee (urine) clear or pale yellow.  Cover your mouth and nose when you cough or sneeze.  Wash your hands with soap and water often, especially after you cough or sneeze. If you cannot use soap and water, use hand sanitizer.  Stay home from work or school as told by your doctor. Unless you are visiting your doctor, try to avoid leaving home until your fever has been gone for 24 hours without the use of medicine.  Keep all follow-up visits as told by your doctor. This is important. How is this prevented?  Getting a yearly (annual) flu shot is the best way to avoid getting the flu. You may get the flu shot in late summer, fall, or winter. Ask your doctor when you should get your flu shot.  Wash your hands often or use hand sanitizer often.  Avoid contact with people who are sick during cold and flu season.  Eat healthy foods.  Drink plenty of fluids.  Get enough sleep.  Exercise regularly. Contact a doctor if:  You get new symptoms.  You have: ? Chest pain. ? Watery poop (diarrhea). ? A fever.  Your cough gets worse.  You start to have more mucus.  You feel sick to your stomach (nauseous).  You throw up (vomit). Get help right away if:  You start to be short of  breath or have trouble breathing.  Your skin or nails turn a bluish color.  You have very bad pain or stiffness in your neck.  You get a sudden headache.  You get sudden pain in your face or ear.  You cannot stop throwing up. This information is not intended to replace advice given to you by your health care provider. Make sure you discuss any questions you have with your health care provider. Document Released: 08/15/2008 Document Revised: 04/13/2016 Document Reviewed: 08/31/2015 Elsevier Interactive Patient Education  2017 [employer].

## 2017-12-27 NOTE — Addendum Note (Signed)
Addended by: Agnes Lawrence on: 12/27/2017 05:13 PM   Modules accepted: Orders

## 2017-12-27 NOTE — Progress Notes (Signed)
HPI:  Acute visit for respiratory illness: -started: 2 days ago, sudden onset -symptoms:nasal congestion, sore throat, cough, fever to 101, body aches -denies:SOB, NVD, tooth pain, inability to tolerate fluids -has tried: nothing -sick contacts/travel/risks: no reported flu, strep or tick exposure  ROS: See pertinent positives and negatives per HPI.  Past Medical History:  Diagnosis Date  . Anxiety and depression   . Arthritis   . Frequent headaches   . GERD (gastroesophageal reflux disease)   . Hypertension   . Osteoporosis   . 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 Urinary incontinence     Past Surgical History:  Procedure Laterality Date  . APPENDECTOMY    . BUNIONECTOMY    . CERVICAL SPINE SURGERY    . SHOULDER SURGERY    . TONSILLECTOMY AND ADENOIDECTOMY      Family History  Problem Relation Age of Onset  . Arthritis Mother   . Hearing loss Mother   . Heart disease Mother   . Hypertension Mother   . Alcohol abuse Father   . Early death Father   . Heart disease Father   . Stroke Father   . Birth defects Brother   . Heart disease Brother     Social History   Socioeconomic History  . Marital status: Widowed    Spouse name: None  . Number of children: None  . Years of education: None  . Highest education level: None  Social Needs  . Financial resource strain: None  . Food insecurity - worry: None  . Food insecurity - inability: None  . Transportation needs - medical: None  . Transportation needs - non-medical: None  Occupational History  . None  Tobacco Use  . Smoking status: Former Smoker    Years: 10.00    Types: Cigarettes  . Smokeless tobacco: Never Used  Substance and Sexual Activity  . Alcohol use: Yes    Alcohol/week: 6.0 oz    Types: 10 Glasses of wine per week  . Drug use: No  . Sexual activity: No  Other Topics Concern  . None  Social History Narrative  . None     Current Outpatient Medications:  .  amLODipine (NORVASC) 5 MG tablet,  Take 1 tablet (5 mg total) daily by mouth., Disp: 90 tablet, Rfl: 3 .  butalbital-acetaminophen-caffeine (FIORICET, ESGIC) 50-325-40 MG tablet, Take 1 tablet by mouth every 6 (six) hours as needed for headache. Take 1 tablet four times daily as needed, Disp: 30 tablet, Rfl: 0 .  Calcium Carb-Cholecalciferol (CALTRATE 600+D) 600-800 MG-UNIT TABS, Take 2 tablets by mouth daily., Disp: 60 tablet, Rfl: 3 .  cetirizine (ZYRTEC) 10 MG tablet, Take 10 mg by mouth daily., Disp: , Rfl:  .  escitalopram (LEXAPRO) 20 MG tablet, Take 1 tablet (20 mg total) daily by mouth., Disp: 90 tablet, Rfl: 1 .  fluticasone (FLONASE) 50 MCG/ACT nasal spray, Place 2 sprays into both nostrils daily., Disp: 16 g, Rfl: 6 .  Fluticasone Furoate 50 MCG/ACT AEPB, Inhale into the lungs., Disp: , Rfl:  .  meloxicam (MOBIC) 15 MG tablet, Take 1 tablet (15 mg total) by mouth daily., Disp: 90 tablet, Rfl: 3 .  metoprolol succinate (TOPROL-XL) 50 MG 24 hr tablet, Take 1 tablet (50 mg total) by mouth daily. Take with or immediately following a meal., Disp: 90 tablet, Rfl: 3 .  mirtazapine (REMERON) 15 MG tablet, Take 1 tablet (15 mg total) at bedtime by mouth., Disp: 90 tablet, Rfl: 1 .  omeprazole (PRILOSEC) 20  MG capsule, Take 1 capsule (20 mg total) by mouth daily., Disp: 90 capsule, Rfl: 3  EXAM:  Vitals:   12/27/17 1645  BP: 120/76  Pulse: 81  Temp: 99.7 F (37.6 C)    Body mass index is 21.98 kg/m.  GENERAL: vitals reviewed and listed above, alert, oriented, appears well hydrated and in no acute distress  HEENT: atraumatic, conjunttiva clear, no obvious abnormalities on inspection of external nose and ears, normal appearance of ear canals and TMs, clear nasal congestion, mild post oropharyngeal erythema with PND, no tonsillar edema or exudate, no sinus TTP  NECK: no obvious masses on inspection  LUNGS: clear to auscultation bilaterally, no wheezes, rales or rhonchi, good air movement  CV: HRRR, no peripheral  edema  MS: moves all extremities without noticeable abnormality  PSYCH: pleasant and cooperative, no obvious depression or anxiety  ASSESSMENT AND PLAN:  Discussed the following assessment and plan:  Influenza A  We discussed potential/likely etiologies, with influenza being likely or possible vs. viral infection or other. We discussed risks/benefits/indications/best timing for tamiflu, symptomatic care, likely course, transmission, potential complications, signs of developing a serious illness and return and emergency precuations. She and daughter wanted tamiflu rx for pt. Rx sent.    Patient Instructions  I sent tamiflu to the pharmacy.    Influenza, Adult Influenza ("the flu") is an infection in the lungs, nose, and throat (respiratory tract). It is caused by a virus. The flu causes many common cold symptoms, as well as a high fever and body aches. It can make you feel very sick. The flu spreads easily from person to person (is contagious). Getting a flu shot (influenza vaccination) every year is the best way to prevent the flu. Follow these instructions at home:  Take over-the-counter and prescription medicines only as told by your doctor.  Use a cool mist humidifier to add moisture (humidity) to the air in your home. This can make it easier to breathe.  Rest as needed.  Drink enough fluid to keep your pee (urine) clear or pale yellow.  Cover your mouth and nose when you cough or sneeze.  Wash your hands with soap and water often, especially after you cough or sneeze. If you cannot use soap and water, use hand sanitizer.  Stay home from work or school as told by your doctor. Unless you are visiting your doctor, try to avoid leaving home until your fever has been gone for 24 hours without the use of medicine.  Keep all follow-up visits as told by your doctor. This is important. How is this prevented?  Getting a yearly (annual) flu shot is the best way to avoid getting the  flu. You may get the flu shot in late summer, fall, or winter. Ask your doctor when you should get your flu shot.  Wash your hands often or use hand sanitizer often.  Avoid contact with people who are sick during cold and flu season.  Eat healthy foods.  Drink plenty of fluids.  Get enough sleep.  Exercise regularly. Contact a doctor if:  You get new symptoms.  You have: ? Chest pain. ? Watery poop (diarrhea). ? A fever.  Your cough gets worse.  You start to have more mucus.  You feel sick to your stomach (nauseous).  You throw up (vomit). Get help right away if:  You start to be short of breath or have trouble breathing.  Your skin or nails turn a bluish color.  You have very bad  pain or stiffness in your neck.  You get a sudden headache.  You get sudden pain in your face or ear.  You cannot stop throwing up. This information is not intended to replace advice given to you by your health care provider. Make sure you discuss any questions you have with your health care provider. Document Released: 08/15/2008 Document Revised: 04/13/2016 Document Reviewed: 08/31/2015 Elsevier Interactive Patient Education  2017 [place], DO

## 2018-01-07 ENCOUNTER — Other Ambulatory Visit: Payer: Medicare Other

## 2018-01-28 ENCOUNTER — Ambulatory Visit (INDEPENDENT_AMBULATORY_CARE_PROVIDER_SITE_OTHER): Payer: Medicare Other | Admitting: Family Medicine

## 2018-01-28 ENCOUNTER — Encounter: Payer: Self-pay | Admitting: Family Medicine

## 2018-01-28 VITALS — BP 118/60 | HR 76 | Temp 98.1°F | Wt 124.0 lb

## 2018-01-28 DIAGNOSIS — H6121 Impacted cerumen, right ear: Secondary | ICD-10-CM | POA: Diagnosis not present

## 2018-01-28 DIAGNOSIS — J302 Other seasonal allergic rhinitis: Secondary | ICD-10-CM | POA: Diagnosis not present

## 2018-01-28 NOTE — Progress Notes (Signed)
Subjective:    Patient ID: Sheila Wolf, female    DOB: 16-Dec-1933, 84 y.o.   MRN: [id number]  Chief Complaint  Patient presents with  . Follow-up    HPI Patient was seen today for acute concern.  Pt with decreased hearing and feeling like her ear was "stopped up".  Pt denies fever, HA, rhinorrhea, sore throat, cough.  Past Medical History:  Diagnosis Date  . Anxiety and depression   . Arthritis   . Frequent headaches   . GERD (gastroesophageal reflux disease)   . Hypertension   . Osteoporosis   . 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 Urinary incontinence     Allergies  Allergen Reactions  . Penicillins Rash    ROS General: Denies fever, chills, night sweats, changes in weight, changes in appetite HEENT: Denies headaches, ear pain, changes in vision, rhinorrhea, sore throat  +decreased hearing, R ear. CV: Denies CP, palpitations, SOB, orthopnea Pulm: Denies SOB, cough, wheezing GI: Denies abdominal pain, nausea, vomiting, diarrhea, constipation GU: Denies dysuria, hematuria, frequency, vaginal discharge Msk: Denies muscle cramps, joint pains Neuro: Denies weakness, numbness, tingling Skin: Denies rashes, bruising Psych: Denies depression, anxiety, hallucinations     Objective:    Blood pressure 118/60, pulse 76, temperature 98.1 F (36.7 C), temperature source Oral, weight 124 lb (56.2 kg), SpO2 99 %.   Gen. Pleasant, well-nourished, in no distress, normal affect   HEENT: [place]/AT, face symmetric, no scleral icterus, PERRLA, nares patent without drainage.  TM normal on the left.  Right TM with cerumen occluding.  Right TM normal after irrigation.  Hearing improved after irrigation Neck: No JVD, no thyromegaly Lungs: no accessory muscle use, normal work of breathing Cardiovascular: RRR, no peripheral edema Neuro:  A&Ox3, CN II-XII intact, normal gait Skin:  Warm, no lesions/ rash   Wt Readings from Last 3 Encounters:  01/28/18 124 lb (56.2 kg)  12/27/17 122 lb  1.6 oz (55.4 kg)  12/04/17 123 lb 3.2 oz (55.9 kg)    Lab Results  Component Value Date   WBC 6.5 10/05/2017   HGB 13.5 10/05/2017   HCT 40.6 10/05/2017   PLT 143.0 (L) 10/05/2017   GLUCOSE 127 (H) 10/05/2017   CHOL 266 (H) 10/05/2017   TRIG 59.0 10/05/2017   HDL 120.40 10/05/2017   LDLCALC 134 (H) 10/05/2017   ALT 16 10/05/2017   AST 23 10/05/2017   NA 141 10/05/2017   K 4.8 10/05/2017   CL 103 10/05/2017   CREATININE 0.75 10/05/2017   BUN 26 (H) 10/05/2017   CO2 28 10/05/2017   TSH 1.30 12/04/2017    Assessment/Plan:  Impacted cerumen of right ear -Informed consent obtained.  Right ear irrigated.  Patient tolerated procedure well.  Hearing improved after irrigation. -Given handout on cerumen impaction. -Can consider De[place]x eardrops for home use.  Seasonal allergies -Given Rx for Zyrtec.  Patient requesting refills on Fioricet and Lexapro.  Will send message to patient's PCP.  Follow-up PRN  Grier Mitts, MD

## 2018-01-28 NOTE — Patient Instructions (Addendum)
You can use Debrox eardrops which are found over-the-counter at your local pharmacy or drugstore  Earwax Buildup, Adult The ears produce a substance called earwax that helps keep bacteria out of the ear and protects the skin in the ear canal. Occasionally, earwax can build up in the ear and cause discomfort or hearing loss. What increases the risk? This condition is more likely to develop in people who:  Are female.  Are elderly.  Naturally produce more earwax.  Clean their ears often with cotton swabs.  Use earplugs often.  Use in-ear headphones often.  Wear hearing aids.  Have narrow ear canals.  Have earwax that is overly thick or sticky.  Have eczema.  Are dehydrated.  Have excess hair in the ear canal.  What are the signs or symptoms? Symptoms of this condition include:  Reduced or muffled hearing.  A feeling of fullness in the ear or feeling that the ear is plugged.  Fluid coming from the ear.  Ear pain.  Ear itch.  Ringing in the ear.  Coughing.  An obvious piece of earwax that can be seen inside the ear canal.  How is this diagnosed? This condition may be diagnosed based on:  Your symptoms.  Your medical history.  An ear exam. During the exam, your health care provider will look into your ear with an instrument called an otoscope.  You may have tests, including a hearing test. How is this treated? This condition may be treated by:  Using ear drops to soften the earwax.  Having the earwax removed by a health care provider. The health care provider may: ? Flush the ear with water. ? Use an instrument that has a loop on the end (curette). ? Use a suction device.  Surgery to remove the wax buildup. This may be done in severe cases.  Follow these instructions at home:  Take over-the-counter and prescription medicines only as told by your health care provider.  Do not put any objects, including cotton swabs, into your ear. You can clean the  opening of your ear canal with a washcloth or facial tissue.  Follow instructions from your health care provider about cleaning your ears. Do not over-clean your ears.  Drink enough fluid to keep your urine clear or pale yellow. This will help to thin the earwax.  Keep all follow-up visits as told by your health care provider. If earwax builds up in your ears often or if you use hearing aids, consider seeing your health care provider for routine, preventive ear cleanings. Ask your health care provider how often you should schedule your cleanings.  If you have hearing aids, clean them according to instructions from the manufacturer and your health care provider. Contact a health care provider if:  You have ear pain.  You develop a fever.  You have blood, pus, or other fluid coming from your ear.  You have hearing loss.  You have ringing in your ears that does not go away.  Your symptoms do not improve with treatment.  You feel like the room is spinning (vertigo). Summary  Earwax can build up in the ear and cause discomfort or hearing loss.  The most common symptoms of this condition include reduced or muffled hearing and a feeling of fullness in the ear or feeling that the ear is plugged.  This condition may be diagnosed based on your symptoms, your medical history, and an ear exam.  This condition may be treated by using ear drops to  soften the earwax or by having the earwax removed by a health care provider.  Do not put any objects, including cotton swabs, into your ear. You can clean the opening of your ear canal with a washcloth or facial tissue. This information is not intended to replace advice given to you by your health care provider. Make sure you discuss any questions you have with your health care provider. Document Released: 12/14/2004 Document Revised: 01/17/2017 Document Reviewed: 01/17/2017 Elsevier Interactive Patient Education  2018 [place] Drops,  Adult You have been diagnosed with a condition that requires you to put drops of medicine into your ears. Ear drops are a medicine that is placed in the ear. This sheet gives you information about how to use ear drops. Your health care provider may also give you more specific instructions. Supplies needed:  Cotton ball.  Medicine. How to put ear drops into your ear 1. Wash your hands thoroughly with soap and water. 2. Make sure your ears are clean and dry. If there is any ear wax or drainage at the outermost portion of the ear canal, wipe it out gently with a cotton-tipped applicator. 3. Warm up the medicine by holding it in the palm of your hand for a few minutes. 4. Shake the medicine if it is a suspension. 5. Use the dropper to draw up the medicine. 6. Hold the dropper above your ear canal and put the drops in the affected ear as instructed. Do not put the dropper into your ear at any time. It may help to pull the outer flap of the ear up and back while you put the drops in. Doing this will straighten out the ear canal so the medicine can get into the canal easier. 7. To make sure your ear soaks up the medicine, do either of these things: ? Lie down with the affected ear facing up for 10 minutes. This will cause the drops to stay in the ear canal and run down and fill the canal. ? Gently put a cotton ball in your ear canal. Leave enough of the cotton ball out so it can be easily removed. Do not push the cotton ball down into your ear with a cotton-tipped swab or other instrument. You can remove the cotton ball once the medicine has been absorbed. 8. If both ears need the drops, repeat the procedure for the other ear. Your health care provider will let you know if you need to put drops in both ears. Follow these instructions at home:  Use the ear drops for as long as directed by your health care provider, even if you begin to feel better.  Always wash your hands before and after handling the  ear drops.  Keep the ear drops at room temperature.  Keep all follow-up visits as told by your health care provider. This is important. Contact a health care provider if:  Your condition gets worse.  Your pain gets worse.  You notice any unusual drainage from your ear, especially if the drainage has a bad smell.  You have trouble hearing.  You have used the ear drops for the amount of time recommended by your health care provider, but your symptoms have not improved. Get help right away if:  You experience a form of dizziness in which you feel as if the room is spinning and you feel nauseated (vertigo).  The outside of your ear becomes red or swollen.  You develop a severe headache with or  without neck stiffness. Summary  Ear drops are a medicine that is placed in the ear.  Put drops in the affected ear as instructed.  Use the ear drops for as long as directed by your health care provider, even if your symptoms begin to get better.  Keep all follow-up visits as told by your health care provider. This is important. This information is not intended to replace advice given to you by your health care provider. Make sure you discuss any questions you have with your health care provider. Document Released: 10/31/2001 Document Revised: 11/09/2016 Document Reviewed: 11/09/2016 Elsevier Interactive Patient Education  2017 [employer].

## 2018-01-30 ENCOUNTER — Telehealth: Payer: Self-pay | Admitting: Adult Health

## 2018-01-30 DIAGNOSIS — F32A Depression, unspecified: Secondary | ICD-10-CM

## 2018-01-30 DIAGNOSIS — F329 Major depressive disorder, single episode, unspecified: Secondary | ICD-10-CM

## 2018-01-30 DIAGNOSIS — Z760 Encounter for issue of repeat prescription: Secondary | ICD-10-CM

## 2018-01-30 DIAGNOSIS — F419 Anxiety disorder, unspecified: Principal | ICD-10-CM

## 2018-01-30 NOTE — Telephone Encounter (Signed)
Copied from [place] [id number]. Topic: Quick Communication - Rx Refill/Question >> Jan 30, 2018  4:40 PM Cleaster Corin, [place] wrote: Medication: cetirizine (ZYRTEC) 10 MG tablet [[id number]]   butalbital-acetaminophen-caffeine (FIORICET, ESGIC) 50-325-40 MG tablet [[id number]]  escitalopram (LEXAPRO) 20 MG tablet [[id number]   Has the patient contacted their pharmacy?yes   (Agent: If no, request that the patient contact the pharmacy for the refill.)   Preferred Pharmacy (with phone number or street name): [address], [place] - [place], [place] - [address] [place] [address] Phone: [phone] Fax: [phone]     Agent: Please be advised that RX refills may take up to 3 business days. We ask that you follow-up with your pharmacy.

## 2018-01-31 NOTE — Telephone Encounter (Signed)
Ok to refill Lexapro for 90 days and Fioricet for 30 days.   She can try Melatonin 5 mg QHS until she is seen.

## 2018-01-31 NOTE — Telephone Encounter (Signed)
Phone call to pt. to discuss medications she is requesting for refill.  Noted at last office visit 01/28/18, with Dr. Volanda Napoleon, that an Rx for Zyrtec had been given.  The pt. stated she did not receive an Rx for Zyrtec. Also, noted Dr. Volanda Napoleon was going to have [facility] address refills of the Lexapro and Fioricet.  The pt. stated she actually has enough Lexapro at this time, just no refills.  Requested something to help her sleep, in addition to refills on Zyrtec and Fioricet.  Will make [facility] aware.  Has f/u appt. on 02/13/18.

## 2018-02-01 MED ORDER — ESCITALOPRAM OXALATE 20 MG PO TABS: 20.0000 mg | ORAL_TABLET | Freq: Every day | ORAL | 0 refills | Status: DC

## 2018-02-01 MED ORDER — BUTALBITAL-APAP-CAFFEINE 50-325-40 MG PO TABS: 1.0000 | ORAL_TABLET | Freq: Four times a day (QID) | ORAL | 0 refills | Status: DC | PRN

## 2018-02-01 NOTE — Telephone Encounter (Signed)
Spoke to the pt and informed her that medication has been sent to the pharmacy. Advised OTC melatonin 5 mg until seen by [name] at upcoming ov.  Pt agreed.  No further action needed.

## 2018-02-13 ENCOUNTER — Ambulatory Visit (INDEPENDENT_AMBULATORY_CARE_PROVIDER_SITE_OTHER): Payer: Medicare Other | Admitting: Adult Health

## 2018-02-13 ENCOUNTER — Encounter: Payer: Self-pay | Admitting: Adult Health

## 2018-02-13 VITALS — BP 118/54 | Temp 98.6°F | Wt 124.0 lb

## 2018-02-13 DIAGNOSIS — H6121 Impacted cerumen, right ear: Secondary | ICD-10-CM | POA: Diagnosis not present

## 2018-02-13 MED ORDER — CETIRIZINE HCL 10 MG PO TABS: 10.0000 mg | ORAL_TABLET | Freq: Every day | ORAL | 2 refills | Status: DC

## 2018-02-13 NOTE — Progress Notes (Signed)
Subjective:    Patient ID: Sheila Wolf, female    DOB: 03-Nov-1934, 84 y.o.   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  She presents to the office today for follow up of cerumen impaction. She was seen by another provider in the office and a partial impaction was removed.   She was advised to follow up after using ear drops   She reports some decreased hearing.   Review of Systems See HPI   Past Medical History:  Diagnosis Date  . Anxiety and depression   . Arthritis   . Frequent headaches   . GERD (gastroesophageal reflux disease)   . Hypertension   . Osteoporosis   . 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 Urinary incontinence     Social History   Socioeconomic History  . Marital status: Widowed    Spouse name: Not on file  . Number of children: Not on file  . Years of education: Not on file  . Highest education level: Not on file  Occupational History  . Not on file  Social Needs  . Financial resource strain: Not on file  . Food insecurity:    Worry: Not on file    Inability: Not on file  . Transportation needs:    Medical: Not on file    Non-medical: Not on file  Tobacco Use  . Smoking status: Former Smoker    Years: 10.00    Types: Cigarettes  . Smokeless tobacco: Never Used  Substance and Sexual Activity  . Alcohol use: Yes    Alcohol/week: 6.0 oz    Types: 10 Glasses of wine per week  . Drug use: No  . Sexual activity: Never  Lifestyle  . Physical activity:    Days per week: Not on file    Minutes per session: Not on file  . Stress: Not on file  Relationships  . Social connections:    Talks on phone: Not on file    Gets together: Not on file    Attends religious service: Not on file    Active member of club or organization: Not on  file    Attends meetings of clubs or organizations: Not on file    Relationship status: Not on file  . Intimate partner violence:    Fear of current or ex partner: Not on file    Emotionally abused: Not on file    Physically abused: Not on file    Forced sexual activity: Not on file  Other Topics Concern  . Not on file  Social History Narrative  . Not on file    Past Surgical History:  Procedure Laterality Date  . APPENDECTOMY    . BUNIONECTOMY    . CERVICAL SPINE SURGERY    . SHOULDER SURGERY    . TONSILLECTOMY AND ADENOIDECTOMY      Family History  Problem Relation Age of Onset  . Arthritis Mother   . Hearing loss Mother   . Heart disease Mother   . Hypertension Mother   . Alcohol abuse Father   . Early death Father   . Heart disease Father   . Stroke Father   . Birth defects Brother   . Heart disease Brother     Allergies  Allergen Reactions  . Penicillins Rash    Current Outpatient Medications on File  Prior to Visit  Medication Sig Dispense Refill  . amLODipine (NORVASC) 5 MG tablet Take 1 tablet (5 mg total) daily by mouth. 90 tablet 3  . butalbital-acetaminophen-caffeine (FIORICET, ESGIC) 50-325-40 MG tablet Take 1 tablet by mouth every 6 (six) hours as needed for headache. Take 1 tablet four times daily as needed 30 tablet 0  . Calcium Carb-Cholecalciferol (CALTRATE 600+D) 600-800 MG-UNIT TABS Take 2 tablets by mouth daily. 60 tablet 3  . escitalopram (LEXAPRO) 20 MG tablet Take 1 tablet (20 mg total) by mouth daily. 90 tablet 0  . fluticasone (FLONASE) 50 MCG/ACT nasal spray Place 2 sprays into both nostrils daily. 16 g 6  . Fluticasone Furoate 50 MCG/ACT AEPB Inhale into the lungs.    . meloxicam (MOBIC) 15 MG tablet Take 1 tablet (15 mg total) by mouth daily. 90 tablet 3  . metoprolol succinate (TOPROL-XL) 50 MG 24 hr tablet Take 1 tablet (50 mg total) by mouth daily. Take with or immediately following a meal. 90 tablet 3  . mirtazapine (REMERON) 15 MG  tablet Take 1 tablet (15 mg total) at bedtime by mouth. 90 tablet 1  . omeprazole (PRILOSEC) 20 MG capsule Take 1 capsule (20 mg total) by mouth daily. 90 capsule 3   No current facility-administered medications on file prior to visit.     BP (!) 118/54 (BP Location: Left Arm)   Temp 98.6 F (37 C) (Oral)   Wt 124 lb (56.2 kg)   BMI 22.32 kg/m       Objective:   Physical Exam  Constitutional: She appears well-developed and well-nourished. No distress.  HENT:  Cerumen impaction in right ear   Cardiovascular: Normal rate.  Skin: She is not diaphoretic.  Nursing note and vitals reviewed.     Assessment & Plan:  1. Impacted cerumen of right ear - Cerumen impaction was easily removed with irrigation. Patient endorsed improvement in hearing   Dorothyann Peng, NP

## 2018-05-01 DIAGNOSIS — M1712 Unilateral primary osteoarthritis, left knee: Secondary | ICD-10-CM | POA: Diagnosis not present

## 2018-05-01 DIAGNOSIS — M25562 Pain in left knee: Secondary | ICD-10-CM | POA: Diagnosis not present

## 2018-06-07 ENCOUNTER — Ambulatory Visit (INDEPENDENT_AMBULATORY_CARE_PROVIDER_SITE_OTHER): Payer: Medicare Other | Admitting: Adult Health

## 2018-06-07 ENCOUNTER — Encounter: Payer: Self-pay | Admitting: Adult Health

## 2018-06-07 VITALS — BP 140/70 | Temp 98.4°F | Wt 128.0 lb

## 2018-06-07 DIAGNOSIS — Z760 Encounter for issue of repeat prescription: Secondary | ICD-10-CM

## 2018-06-07 DIAGNOSIS — S161XXA Strain of muscle, fascia and tendon at neck level, initial encounter: Secondary | ICD-10-CM

## 2018-06-07 DIAGNOSIS — R300 Dysuria: Secondary | ICD-10-CM | POA: Diagnosis not present

## 2018-06-07 LAB — POC URINALSYSI DIPSTICK (AUTOMATED)
Bilirubin, UA: NEGATIVE
Blood, UA: NEGATIVE
Glucose, UA: NEGATIVE
Ketones, UA: POSITIVE
Nitrite, UA: NEGATIVE
Protein, UA: NEGATIVE
Spec Grav, UA: 1.015
Urobilinogen, UA: 0.2 U/dL
pH, UA: 6

## 2018-06-07 MED ORDER — BUTALBITAL-APAP-CAFFEINE 50-325-40 MG PO TABS: 1.0000 | ORAL_TABLET | Freq: Four times a day (QID) | ORAL | 1 refills | Status: DC | PRN

## 2018-06-07 MED ORDER — METHYLPREDNISOLONE 4 MG PO TBPK: ORAL_TABLET | ORAL | 0 refills | Status: DC

## 2018-06-07 NOTE — Progress Notes (Signed)
Subjective:    Patient ID: Sheila Wolf, female    DOB: September 20, 1934, 84 y.o.   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  She presents to the office today for an acute issue of sore neck x 3 days.  Soreness is located  along the shoulder blade and radiates into the neck.  Pain is worse with head movements.  She denies any falls or trauma.  She reports getting a professional massage earlier today, and this helped with her discomfort.  Unfortunately discomfort has started to return.  She is also been using a heating pad at home and reports improvement in her soreness with this modality  Also reports dysuria, frequency, urgency, and some issues with incontinence over the last month or 2.  She denies any back pain or abdominal pain and has not noticed any fevers.  Review of Systems See HPI   Past Medical History:  Diagnosis Date  . Anxiety and depression   . Arthritis   . Frequent headaches   . GERD (gastroesophageal reflux disease)   . Hypertension   . Osteoporosis   . 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 Urinary incontinence     Social History   Socioeconomic History  . Marital status: Widowed    Spouse name: Not on file  . Number of children: Not on file  . Years of education: Not on file  . Highest education level: Not on file  Occupational History  . Not on file  Social Needs  . Financial resource strain: Not on file  . Food insecurity:    Worry: Not on file    Inability: Not on file  . Transportation needs:    Medical: Not on file    Non-medical: Not on file  Tobacco Use  . Smoking status: Former Smoker    Years: 10.00    Types: Cigarettes  . Smokeless tobacco: Never Used  Substance and Sexual Activity  . Alcohol use: Yes    Alcohol/week: 6.0 oz   Types: 10 Glasses of wine per week  . Drug use: No  . Sexual activity: Never  Lifestyle  . Physical activity:    Days per week: Not on file    Minutes per session: Not on file  . Stress: Not on file  Relationships  . Social connections:    Talks on phone: Not on file    Gets together: Not on file    Attends religious service: Not on file    Active member of club or organization: Not on file    Attends meetings of clubs or organizations: Not on file    Relationship status: Not on file  . Intimate partner violence:    Fear of current or ex partner: Not on file    Emotionally abused: Not on file    Physically abused: Not on file    Forced sexual activity: Not on file  Other Topics Concern  . Not on file  Social History Narrative  . Not on file    Past Surgical History:  Procedure Laterality Date  . APPENDECTOMY    . BUNIONECTOMY    . CERVICAL SPINE SURGERY    . SHOULDER SURGERY    . TONSILLECTOMY AND ADENOIDECTOMY      Family History  Problem Relation Age of Onset  . Arthritis Mother   . Hearing  loss Mother   . Heart disease Mother   . Hypertension Mother   . Alcohol abuse Father   . Early death Father   . Heart disease Father   . Stroke Father   . Birth defects Brother   . Heart disease Brother     Allergies  Allergen Reactions  . Penicillins Rash    Current Outpatient Medications on File Prior to Visit  Medication Sig Dispense Refill  . amLODipine (NORVASC) 5 MG tablet Take 1 tablet (5 mg total) daily by mouth. 90 tablet 3  . Calcium Carb-Cholecalciferol (CALTRATE 600+D) 600-800 MG-UNIT TABS Take 2 tablets by mouth daily. 60 tablet 3  . escitalopram (LEXAPRO) 20 MG tablet Take 1 tablet (20 mg total) by mouth daily. 90 tablet 0  . fluticasone (FLONASE) 50 MCG/ACT nasal spray Place 2 sprays into both nostrils daily. 16 g 6  . Fluticasone Furoate 50 MCG/ACT AEPB Inhale into the lungs.    . metoprolol succinate (TOPROL-XL) 50 MG 24 hr tablet Take 1 tablet (50  mg total) by mouth daily. Take with or immediately following a meal. 90 tablet 3  . mirtazapine (REMERON) 15 MG tablet Take 1 tablet (15 mg total) at bedtime by mouth. 90 tablet 1  . omeprazole (PRILOSEC) 20 MG capsule Take 1 capsule (20 mg total) by mouth daily. 90 capsule 3  . cetirizine (ZYRTEC) 10 MG tablet Take 1 tablet (10 mg total) by mouth daily. 90 tablet 2   No current facility-administered medications on file prior to visit.     BP 140/70   Temp 98.4 F (36.9 C) (Oral)   Wt 128 lb (58.1 kg)   BMI 23.04 kg/m       Objective:   Physical Exam  Constitutional: She is oriented to person, place, and time. She appears well-developed and well-nourished. No distress.  Eyes: Pupils are equal, round, and reactive to light. EOM are normal.  Cardiovascular: Normal rate, regular rhythm, normal heart sounds and intact distal pulses.  Pulmonary/Chest: Effort normal and breath sounds normal.  Abdominal: Soft. Bowel sounds are normal. There is no CVA tenderness.  Musculoskeletal: Normal range of motion. She exhibits tenderness (with palpation to left trapezius). She exhibits no edema or deformity.  Normal range of motion with head  Neurological: She is alert and oriented to person, place, and time.  Skin: Skin is warm and dry. She is not diaphoretic.  Psychiatric: She has a normal mood and affect. Her behavior is normal. Judgment and thought content normal.  Nursing note and vitals reviewed.     Assessment & Plan:  1. Strain of neck muscle, initial encounter - methylPREDNISolone (MEDROL DOSEPAK) 4 MG TBPK tablet; Take as directed  Dispense: 21 tablet; Refill: 0 -Continue with warm compress and advised stretching exercises 2. Dysuria  - POCT Urinalysis Dipstick (Automated)- trace leuks and positive ketones - Urine Culture -Treat if needed once urine culture results 3. Medication refill - butalbital-acetaminophen-caffeine (FIORICET, ESGIC) 50-325-40 MG tablet; Take 1 tablet by mouth  every 6 (six) hours as needed for headache. Take 1 tablet four times daily as needed  Dispense: 30 tablet; Refill: 1   Dorothyann Peng, NP

## 2018-06-08 LAB — URINE CULTURE
MICRO NUMBER:: 90857892
Result:: NO GROWTH
SPECIMEN QUALITY:: ADEQUATE

## 2018-06-12 ENCOUNTER — Telehealth: Payer: Self-pay | Admitting: Family Medicine

## 2018-06-12 NOTE — Telephone Encounter (Signed)
Spoke to the pt and she has completed the methylPREDNISolone (MEDROL DOSEPAK) 4 MG TBPK tablet.  She has also tried Tylenol.  Continues to have pain in her neck.  Please advise.

## 2018-06-12 NOTE — Telephone Encounter (Signed)
I did not refer her and I do not necessarily believe that her neck pain is caused by a spinal issue, it appears more muscular. I would be happy to reevaluate her

## 2018-06-12 NOTE — Telephone Encounter (Signed)
Pt is now scheduled for follow up on 06/13/18.

## 2018-06-12 NOTE — Telephone Encounter (Signed)
Copied from [place] [id number]. Topic: Inquiry >> Jun 12, 2018  2:47 PM Conception Chancy, NT wrote: Reason for CRM: patient states she is still having pain in her neck and would like to know can [facility] send her something in for this. She states she sees the neurologist in 2 weeks and states she cant wait that long with this pain. Please advise.

## 2018-06-12 NOTE — Telephone Encounter (Signed)
Spoke to the pt and informed her that Sheila Wolf would like her to be seen back in the office for evaluation.  She is scheduled for appt on 06/13/18 @ 11 AM.  No further action required.

## 2018-06-12 NOTE — Telephone Encounter (Signed)
Please have her follow up for revaluation

## 2018-06-12 NOTE — Telephone Encounter (Signed)
Copied from [place] [id number]. Topic: General - Other >> Jun 12, 2018 10:04 AM Ivar Drape wrote: Reason for CRM:   Patient wants to know if Dorothyann Peng would call [facility], Dr. Jovita Gamma Ph [phone], Fax: [phone] to see if she can get in earlier than August 6th.  This is when she made an appt with them.

## 2018-06-13 ENCOUNTER — Ambulatory Visit (INDEPENDENT_AMBULATORY_CARE_PROVIDER_SITE_OTHER): Payer: Medicare Other | Admitting: Adult Health

## 2018-06-13 ENCOUNTER — Encounter: Payer: Self-pay | Admitting: Adult Health

## 2018-06-13 VITALS — BP 146/68 | Temp 98.1°F | Wt 128.0 lb

## 2018-06-13 DIAGNOSIS — T148XXA Other injury of unspecified body region, initial encounter: Secondary | ICD-10-CM

## 2018-06-13 MED ORDER — CYCLOBENZAPRINE HCL 5 MG PO TABS: 5.0000 mg | ORAL_TABLET | Freq: Every day | ORAL | 0 refills | Status: DC

## 2018-06-13 MED ORDER — PREDNISONE 10 MG PO TABS: 10.0000 mg | ORAL_TABLET | Freq: Every day | ORAL | 0 refills | Status: DC

## 2018-06-13 NOTE — Progress Notes (Signed)
Subjective:    Patient ID: Sheila Wolf, female    DOB: [birth year], 84 y.o.   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  Resents to the office today for follow-up regarding neck pain.  He was originally seen for this 6 days ago which time she had "a sore neck" for 3 days prior.  Soreness was located along the left shoulder blade and radiated into the trapezoid.  The pain was worse with head movements.  She was prescribed a Medrol Dosepak which she has finished. She reports today that she is about 25% improved but continues to have pain in her left shoulder/neck.   Review of Systems See HPI   Past Medical History:  Diagnosis Date  . Anxiety and depression   . Arthritis   . Frequent headaches   . GERD (gastroesophageal reflux disease)   . Hypertension   . Osteoporosis   . 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 Urinary incontinence     Social History   Socioeconomic History  . Marital status: Widowed    Spouse name: Not on file  . Number of children: Not on file  . Years of education: Not on file  . Highest education level: Not on file  Occupational History  . Not on file  Social Needs  . Financial resource strain: Not on file  . Food insecurity:    Worry: Not on file    Inability: Not on file  . Transportation needs:    Medical: Not on file    Non-medical: Not on file  Tobacco Use  . Smoking status: Former Smoker    Years: 10.00    Types: Cigarettes  . Smokeless tobacco: Never Used  Substance and Sexual Activity  . Alcohol use: Yes    Alcohol/week: 6.0 oz    Types: 10 Glasses of wine per week  . Drug use: No  . Sexual activity: Never  Lifestyle  . Physical activity:    Days per week: Not on file    Minutes per session: Not on file  . Stress: Not on  file  Relationships  . Social connections:    Talks on phone: Not on file    Gets together: Not on file    Attends religious service: Not on file    Active member of club or organization: Not on file    Attends meetings of clubs or organizations: Not on file    Relationship status: Not on file  . Intimate partner violence:    Fear of current or ex partner: Not on file    Emotionally abused: Not on file    Physically abused: Not on file    Forced sexual activity: Not on file  Other Topics Concern  . Not on file  Social History Narrative  . Not on file    Past Surgical History:  Procedure Laterality Date  . APPENDECTOMY    . BUNIONECTOMY    . CERVICAL SPINE SURGERY    . SHOULDER SURGERY    . TONSILLECTOMY AND ADENOIDECTOMY      Family History  Problem Relation Age of Onset  . Arthritis Mother   . Hearing loss Mother   . Heart disease Mother   . Hypertension Mother   . Alcohol abuse Father   . Early death Father   . Heart disease Father   .  Stroke Father   . Birth defects Brother   . Heart disease Brother     Allergies  Allergen Reactions  . Penicillins Rash    Current Outpatient Medications on File Prior to Visit  Medication Sig Dispense Refill  . amLODipine (NORVASC) 5 MG tablet Take 1 tablet (5 mg total) daily by mouth. 90 tablet 3  . butalbital-acetaminophen-caffeine (FIORICET, ESGIC) 50-325-40 MG tablet Take 1 tablet by mouth every 6 (six) hours as needed for headache. Take 1 tablet four times daily as needed 30 tablet 1  . Calcium Carb-Cholecalciferol (CALTRATE 600+D) 600-800 MG-UNIT TABS Take 2 tablets by mouth daily. 60 tablet 3  . cetirizine (ZYRTEC) 10 MG tablet Take 1 tablet (10 mg total) by mouth daily. 90 tablet 2  . escitalopram (LEXAPRO) 20 MG tablet Take 1 tablet (20 mg total) by mouth daily. 90 tablet 0  . fluticasone (FLONASE) 50 MCG/ACT nasal spray Place 2 sprays into both nostrils daily. 16 g 6  . Fluticasone Furoate 50 MCG/ACT AEPB Inhale into  the lungs.    . methylPREDNISolone (MEDROL DOSEPAK) 4 MG TBPK tablet Take as directed 21 tablet 0  . metoprolol succinate (TOPROL-XL) 50 MG 24 hr tablet Take 1 tablet (50 mg total) by mouth daily. Take with or immediately following a meal. 90 tablet 3  . mirtazapine (REMERON) 15 MG tablet Take 1 tablet (15 mg total) at bedtime by mouth. 90 tablet 1  . omeprazole (PRILOSEC) 20 MG capsule Take 1 capsule (20 mg total) by mouth daily. 90 capsule 3   No current facility-administered medications on file prior to visit.     There were no vitals taken for this visit.      Objective:   Physical Exam  Constitutional: She is oriented to person, place, and time. She appears well-developed and well-nourished. No distress.  Cardiovascular: Normal rate, regular rhythm, normal heart sounds and intact distal pulses.  Pulmonary/Chest: Effort normal and breath sounds normal.  Musculoskeletal: Normal range of motion. She exhibits tenderness.  Tenderness with palpation along left trapezius.  No decreased ROM in arm ,shoulder, or head No decreased grip strength noted.  No spinal pain   Neurological: She is alert and oriented to person, place, and time.  Skin: Skin is warm and dry. Capillary refill takes less than 2 seconds. She is not diaphoretic.  Psychiatric: She has a normal mood and affect. Her behavior is normal. Judgment and thought content normal.  Nursing note and vitals reviewed.     Assessment & Plan:  1. Muscle strain - This continues to appear as muscle strain. Doubt bursa inflammation. Will trial low dose muscle relaxer. Reviewed side effects of this medication. Will also trail another round of prednisone  - predniSONE (DELTASONE) 10 MG tablet; Take 1 tablet (10 mg total) by mouth daily with breakfast.  Dispense: 7 tablet; Refill: 0 - cyclobenzaprine (FLEXERIL) 5 MG tablet; Take 1 tablet (5 mg total) by mouth at bedtime.  Dispense: 15 tablet; Refill: 0 - Follow up in 4-5 days if no  improvement  - Consider referral to PT   Dorothyann Peng, NP

## 2018-06-25 DIAGNOSIS — M542 Cervicalgia: Secondary | ICD-10-CM | POA: Diagnosis not present

## 2018-06-25 DIAGNOSIS — M5412 Radiculopathy, cervical region: Secondary | ICD-10-CM | POA: Diagnosis not present

## 2018-06-25 DIAGNOSIS — I10 Essential (primary) hypertension: Secondary | ICD-10-CM | POA: Diagnosis not present

## 2018-06-25 DIAGNOSIS — M4722 Other spondylosis with radiculopathy, cervical region: Secondary | ICD-10-CM | POA: Diagnosis not present

## 2018-06-25 DIAGNOSIS — M532X2 Spinal instabilities, cervical region: Secondary | ICD-10-CM | POA: Diagnosis not present

## 2018-06-25 DIAGNOSIS — M5030 Other cervical disc degeneration, unspecified cervical region: Secondary | ICD-10-CM | POA: Diagnosis not present

## 2018-07-19 DIAGNOSIS — M25562 Pain in left knee: Secondary | ICD-10-CM | POA: Diagnosis not present

## 2018-07-19 DIAGNOSIS — M1712 Unilateral primary osteoarthritis, left knee: Secondary | ICD-10-CM | POA: Diagnosis not present

## 2018-07-26 DIAGNOSIS — D0471 Carcinoma in situ of skin of right lower limb, including hip: Secondary | ICD-10-CM | POA: Diagnosis not present

## 2018-07-26 DIAGNOSIS — D225 Melanocytic nevi of trunk: Secondary | ICD-10-CM | POA: Diagnosis not present

## 2018-07-26 DIAGNOSIS — Z1283 Encounter for screening for malignant neoplasm of skin: Secondary | ICD-10-CM | POA: Diagnosis not present

## 2018-08-13 ENCOUNTER — Emergency Department (HOSPITAL_COMMUNITY): Payer: Medicare Other

## 2018-08-13 ENCOUNTER — Ambulatory Visit: Payer: Medicare Other | Admitting: Adult Health

## 2018-08-13 ENCOUNTER — Encounter (HOSPITAL_COMMUNITY): Payer: Self-pay | Admitting: Emergency Medicine

## 2018-08-13 ENCOUNTER — Ambulatory Visit: Payer: Self-pay

## 2018-08-13 ENCOUNTER — Emergency Department (HOSPITAL_COMMUNITY)
Admission: EM | Admit: 2018-08-13 | Discharge: 2018-08-13 | Disposition: A | Discharge status: Home / Self Care | Payer: Medicare Other | Attending: Emergency Medicine | Admitting: Emergency Medicine

## 2018-08-13 ENCOUNTER — Other Ambulatory Visit: Payer: Self-pay

## 2018-08-13 DIAGNOSIS — Z79899 Other long term (current) drug therapy: Secondary | ICD-10-CM | POA: Insufficient documentation

## 2018-08-13 DIAGNOSIS — R079 Chest pain, unspecified: Secondary | ICD-10-CM | POA: Insufficient documentation

## 2018-08-13 DIAGNOSIS — I10 Essential (primary) hypertension: Secondary | ICD-10-CM | POA: Insufficient documentation

## 2018-08-13 DIAGNOSIS — Z87891 Personal history of nicotine dependence: Secondary | ICD-10-CM | POA: Insufficient documentation

## 2018-08-13 DIAGNOSIS — R0789 Other chest pain: Secondary | ICD-10-CM | POA: Diagnosis not present

## 2018-08-13 LAB — CBC
HEMATOCRIT: 40 % (ref 36.0–46.0)
HEMOGLOBIN: 12.5 g/dL (ref 12.0–15.0)
MCH: 31.7 pg (ref 26.0–34.0)
MCHC: 31.3 g/dL (ref 30.0–36.0)
MCV: 101.5 fL — ABNORMAL HIGH (ref 78.0–100.0)
Platelets: 135 10*3/uL — ABNORMAL LOW (ref 150–400)
RBC: 3.94 MIL/uL (ref 3.87–5.11)
RDW: 13.1 % (ref 11.5–15.5)
WBC: 7.4 10*3/uL (ref 4.0–10.5)

## 2018-08-13 LAB — BASIC METABOLIC PANEL
ANION GAP: 10 (ref 5–15)
BUN: 21 mg/dL (ref 8–23)
CHLORIDE: 104 mmol/L (ref 98–111)
CO2: 25 mmol/L (ref 22–32)
Calcium: 9.3 mg/dL (ref 8.9–10.3)
Creatinine, Ser: 0.87 mg/dL (ref 0.44–1.00)
GFR calc non Af Amer: >60 mL/min (ref >60)
Glucose, Bld: 169 mg/dL — ABNORMAL HIGH (ref 70–99)
POTASSIUM: 4.3 mmol/L (ref 3.5–5.1)
Sodium: 139 mmol/L (ref 135–145)

## 2018-08-13 LAB — I-STAT TROPONIN, ED
Troponin i, poc: 0 ng/mL (ref 0.00–0.08)
Troponin i, poc: 0.01 ng/mL (ref 0.00–0.08)

## 2018-08-13 NOTE — Telephone Encounter (Signed)
Incoming call from patient who was driving at the time of call. Patient states that The pain started last night.  Pain is located on the left side, under her breast radiates to her left shoulder.  Patient states it started last night.  Got up to go to the bathroom and she felt it then. Patient states there is a family history of hypertension.  States it does not worsen with exertion. Rate the pain mild to moderate. States she was nauseated last night.  Per protocol  Recommended that she go to ED.  Reviewed, care advise with her. She voiced understanding.  Related to patient that I would cancel her appointment for this after noon. Patient states that she would contact her daughter ,then go to [facility].    Reason for Disposition . [1] Intermittent  chest pain or "angina" AND [2] increasing in severity or frequency  (Exception: pains lasting a few seconds)  Answer Assessment - Initial Assessment Questions 1. LOCATION: "Where does it hurt?"       Left breast 2. RADIATION: "Does the pain go anywhere else?" (e.g., into neck, jaw, arms, back)     Left shoulder 3. ONSET: "When did the chest pain begin?" (Minutes, hours or days)     Last night  Woke up to go  To bathroom 4. PATTERN "Does the pain come and go, or has it been constant since it started?"  "Does it get worse with exertion?" no     kinda comes and goes , bothering me now 5. DURATION: "How long does it last" (e.g., seconds, minutes, hours)   Last night it kept hurting.  6. SEVERITY: "How bad is the pain?"  (e.g., Scale 1-10; mild, moderate, or severe)    - MILD (1-3): doesn't interfere with normal activities     - MODERATE (4-7): interferes with normal activities or awakens from sleep    - SEVERE (8-10): excruciating pain, unable to do any normal activities       Moderate to mild 7. CARDIAC RISK FACTORS: "Do you have any history of heart problems or risk factors for heart disease?" (e.g., prior heart attack, angina; high blood pressure, diabetes,  being overweight, high cholesterol, smoking, or strong family history of heart disease)     High blood pressure, family history 8. PULMONARY RISK FACTORS: "Do you have any history of lung disease?"  (e.g., blood clots in lung, asthma, emphysema, birth control pills)     no 9. CAUSE: "What do you think is causing the chest pain?"   " I dont know, that's why I called yall" 10. OTHER SYMPTOMS: "Do you have any other symptoms?" (e.g., dizziness, nausea, vomiting, sweating, fever, difficulty breathing, cough)       Nauseated last night 11. PREGNANCY: "Is there any chance you are pregnant?" "When was your last menstrual period?"       na  Protocols used: CHEST PAIN-A-AH

## 2018-08-13 NOTE — ED Notes (Signed)
Patient Alert and oriented to baseline. Stable and ambulatory to baseline. Patient verbalized understanding of the discharge instructions.  Patient belongings were taken by the patient.   

## 2018-08-13 NOTE — ED Provider Notes (Signed)
[facility] EMERGENCY DEPARTMENT Provider Note   CSN: [id number] Arrival date & time: 08/13/18  1625     History   Chief Complaint Chief Complaint  Patient presents with  . Chest Pain    HPI Lexey Fletes Kuna is a 84 y.o. female.  Patient presents for evaluation of chest discomfort in left chest that she first noticed last night when she got up to the bathroom. She does not feel it is what woke her. Discomfort is described as "heaviness". She felt some soreness in her left shoulder. Chest heaviness was still present this morning, which she states improved when she stood up from bed. It has been intermittent throughout today, never associated with SOB, or vomiting. No history of heart disease or previous cardiology evaluation. She has a history of HTN. She does not have any pain currently.  The history is provided by the patient and a relative. No language interpreter was used.  Chest Pain   Pertinent negatives include no fever, no nausea, no shortness of breath and no vomiting.    Past Medical History:  Diagnosis Date  . Anxiety and depression   . Arthritis   . Frequent headaches   . GERD (gastroesophageal reflux disease)   . Hypertension   . Osteoporosis   . Thrombocytopathia ([place])   . Urinary incontinence     Patient Active Problem List   Diagnosis Date Noted  . Hypertension   . GERD (gastroesophageal reflux disease)   . Frequent headaches   . Anxiety and depression     Past Surgical History:  Procedure Laterality Date  . APPENDECTOMY    . BUNIONECTOMY    . CERVICAL SPINE SURGERY    . SHOULDER SURGERY    . TONSILLECTOMY AND ADENOIDECTOMY       OB History   None      Home Medications    Prior to Admission medications   Medication Sig Start Date End Date Taking? Authorizing Provider  amLODipine (NORVASC) 5 MG tablet Take 1 tablet (5 mg total) daily by mouth. 10/05/17   Nafziger, [name], NP  butalbital-acetaminophen-caffeine  (FIORICET, ESGIC) 50-325-40 MG tablet Take 1 tablet by mouth every 6 (six) hours as needed for headache. Take 1 tablet four times daily as needed 06/07/18   Dorothyann Peng, NP  Calcium Carb-Cholecalciferol (CALTRATE 600+D) 600-800 MG-UNIT TABS Take 2 tablets by mouth daily. 09/04/17   Nafziger, [name], NP  cetirizine (ZYRTEC) 10 MG tablet Take 1 tablet (10 mg total) by mouth daily. 02/13/18 05/14/18  Nafziger, [name], NP  cyclobenzaprine (FLEXERIL) 5 MG tablet Take 1 tablet (5 mg total) by mouth at bedtime. 06/13/18   Nafziger, [name], NP  escitalopram (LEXAPRO) 20 MG tablet Take 1 tablet (20 mg total) by mouth daily. 02/01/18   Nafziger, [name], NP  fluticasone (FLONASE) 50 MCG/ACT nasal spray Place 2 sprays into both nostrils daily. 12/04/17   Nafziger, [name], NP  Fluticasone Furoate 50 MCG/ACT AEPB Inhale into the lungs.    [provider]  meloxicam (MOBIC) 15 MG tablet Take 15 mg by mouth daily.    [provider]  metoprolol succinate (TOPROL-XL) 50 MG 24 hr tablet Take 1 tablet (50 mg total) by mouth daily. Take with or immediately following a meal. 09/04/17   Nafziger, [name], NP  mirtazapine (REMERON) 15 MG tablet Take 1 tablet (15 mg total) at bedtime by mouth. 10/05/17   Nafziger, [name], NP  omeprazole (PRILOSEC) 20 MG capsule Take 1 capsule (20 mg total) by mouth  daily. 12/04/17   Dorothyann Peng, NP  predniSONE (DELTASONE) 10 MG tablet Take 1 tablet (10 mg total) by mouth daily with breakfast. 06/13/18   Dorothyann Peng, NP    Family History Family History  Problem Relation Age of Onset  . Arthritis Mother   . Hearing loss Mother   . Heart disease Mother   . Hypertension Mother   . Alcohol abuse Father   . Early death Father   . Heart disease Father   . Stroke Father   . Birth defects Brother   . Heart disease Brother     Social History Social History   Tobacco Use  . Smoking status: Former Smoker    Years: 10.00    Types: Cigarettes  . Smokeless tobacco: Never Used    Substance Use Topics  . Alcohol use: Yes    Alcohol/week: 10.0 standard drinks    Types: 10 Glasses of wine per week  . Drug use: No     Allergies   Penicillins   Review of Systems Review of Systems  Constitutional: Negative for chills and fever.  HENT: Negative.   Respiratory: Negative.  Negative for shortness of breath.   Cardiovascular: Positive for chest pain. Negative for leg swelling.  Gastrointestinal: Negative.  Negative for nausea and vomiting.  Musculoskeletal: Negative.        Left shoulder discomfort.  Skin: Negative.   Neurological: Negative.      Physical Exam Updated Vital Signs BP (!) 182/69   Pulse (!) 55   Temp 97.7 F (36.5 C) (Oral)   Resp 18   SpO2 99%   Physical Exam  Constitutional: She is oriented to person, place, and time. She appears well-developed and well-nourished.  HENT:  Head: Normocephalic.  Neck: Normal range of motion. Neck supple.  No carotid bruit.  Cardiovascular: Normal rate and regular rhythm.  No murmur heard. Pulmonary/Chest: Effort normal and breath sounds normal. She has no wheezes. She has no rhonchi. She has no rales. She exhibits no tenderness.  Abdominal: Soft. Bowel sounds are normal. There is no tenderness. There is no rebound and no guarding.  Musculoskeletal: Normal range of motion.       Right lower leg: Normal. She exhibits no edema.       Left lower leg: Normal. She exhibits no edema.  Neurological: She is alert and oriented to person, place, and time.  Skin: Skin is warm and dry. No rash noted.  Psychiatric: She has a normal mood and affect.     ED Treatments / Results  Labs (all labs ordered are listed, but only abnormal results are displayed) Labs Reviewed  BASIC METABOLIC PANEL - Abnormal; Notable for the following components:      Result Value   Glucose, Bld 169 (*)    All other components within normal limits  CBC - Abnormal; Notable for the following components:   MCV 101.5 (*)    Platelets  135 (*)    All other components within normal limits  I-STAT TROPONIN, ED  I-STAT TROPONIN, ED   Results for orders placed or performed during the hospital encounter of 09/32/35  Basic metabolic panel  Result Value Ref Range   Sodium 139 135 - 145 mmol/L   Potassium 4.3 3.5 - 5.1 mmol/L   Chloride 104 98 - 111 mmol/L   CO2 25 22 - 32 mmol/L   Glucose, Bld 169 (H) 70 - 99 mg/dL   BUN 21 8 - 23 mg/dL   Creatinine, Ser  0.87 0.44 - 1.00 mg/dL   Calcium 9.3 8.9 - 10.3 mg/dL   GFR calc non Af Amer >60 >60 mL/min   GFR calc Af Amer >60 >60 mL/min   Anion gap 10 5 - 15  CBC  Result Value Ref Range   WBC 7.4 4.0 - 10.5 K/uL   RBC 3.94 3.87 - 5.11 MIL/uL   Hemoglobin 12.5 12.0 - 15.0 g/dL   HCT 40.0 36.0 - 46.0 %   MCV 101.5 (H) 78.0 - 100.0 fL   MCH 31.7 26.0 - 34.0 pg   MCHC 31.3 30.0 - 36.0 g/dL   RDW 13.1 11.5 - 15.5 %   Platelets 135 (L) 150 - 400 K/uL  I-stat troponin, ED  Result Value Ref Range   Troponin i, poc 0.00 0.00 - 0.08 ng/mL   Comment 3          I-Stat Troponin, ED (not at [facility])  Result Value Ref Range   Troponin i, poc 0.01 0.00 - 0.08 ng/mL   Comment 3             EKG EKG Interpretation  Date/Time:  Tuesday August 13 2018 16:30:46 EDT Ventricular Rate:  65 PR Interval:  156 QRS Duration: 68 QT Interval:  434 QTC Calculation: 451 R Axis:   -7 Text Interpretation:  Normal sinus rhythm Normal ECG No significant change since last tracing Confirmed by Addison Lank [phone]) on 08/13/2018 11:04:23 PM   Radiology Dg Chest 2 View  Result Date: 08/13/2018 CLINICAL DATA:  84 year old female with history of chest heaviness since yesterday evening. EXAM: CHEST - 2 VIEW COMPARISON:  No priors. FINDINGS: Lung volumes are normal. No consolidative airspace disease. No pleural effusions. No pneumothorax. No pulmonary nodule or mass noted. Pulmonary vasculature and the cardiomediastinal silhouette are within normal limits. Atherosclerosis in the thoracic aorta. Status  post left shoulder arthroplasty. IMPRESSION: 1.  No radiographic evidence of acute cardiopulmonary disease. 2. Aortic atherosclerosis. Electronically Signed   By: Vinnie Langton M.D.   On: 08/13/2018 18:42    Procedures Procedures (including critical care time)  Medications Ordered in ED Medications - No data to display   Initial Impression / Assessment and Plan / ED Course  I have reviewed the triage vital signs and the nursing notes.  Pertinent labs & imaging results that were available during my care of the patient were reviewed by me and considered in my medical decision making (see chart for details).     Patient presents with chest discomfort started last night, on and off through today. No SOB, nausea. No history of heart disease. No pain currently.  Labs are unremarkable, including troponin x 2. EKG show no ischemia.  CXR clear of infiltrates or edema. The patient is asymptomatic. She is seen and evaluated by Dr. Leonette Monarch and is felt appropriate for discharge home. Will refer to cardiology for further outpatient evaluation.  Final Clinical Impressions(s) / ED Diagnoses   Final diagnoses:  None   1. Nonspecific chest pain  ED Discharge Orders    None       Charlann Lange, Hershal Coria 08/13/18 [place], [place], MD 08/14/18 [id number]

## 2018-08-13 NOTE — ED Triage Notes (Signed)
Pt c/o chest heaviness that started last night. Heaviness radiated to her left shoulder and back. Reports heaviness has been intermittent during the day.

## 2018-08-14 ENCOUNTER — Other Ambulatory Visit: Payer: Self-pay | Admitting: *Deleted

## 2018-08-14 DIAGNOSIS — I10 Essential (primary) hypertension: Secondary | ICD-10-CM

## 2018-08-14 DIAGNOSIS — Z760 Encounter for issue of repeat prescription: Secondary | ICD-10-CM

## 2018-08-14 MED ORDER — AMLODIPINE BESYLATE 5 MG PO TABS: 5.0000 mg | ORAL_TABLET | Freq: Every day | ORAL | 1 refills | Status: DC

## 2018-08-16 ENCOUNTER — Other Ambulatory Visit: Payer: Self-pay | Admitting: *Deleted

## 2018-08-16 MED ORDER — CETIRIZINE HCL 10 MG PO TABS: 10.0000 mg | ORAL_TABLET | Freq: Every day | ORAL | 2 refills | Status: DC

## 2018-08-22 ENCOUNTER — Encounter: Payer: Self-pay | Admitting: Adult Health

## 2018-08-22 ENCOUNTER — Ambulatory Visit (INDEPENDENT_AMBULATORY_CARE_PROVIDER_SITE_OTHER): Payer: Medicare Other | Admitting: Adult Health

## 2018-08-22 VITALS — BP 128/68 | HR 62 | Temp 98.8°F | Wt 129.4 lb

## 2018-08-22 DIAGNOSIS — K2100 Gastro-esophageal reflux disease with esophagitis, without bleeding: Secondary | ICD-10-CM

## 2018-08-22 DIAGNOSIS — Z760 Encounter for issue of repeat prescription: Secondary | ICD-10-CM | POA: Diagnosis not present

## 2018-08-22 MED ORDER — BUTALBITAL-APAP-CAFFEINE 50-325-40 MG PO TABS: 1.0000 | ORAL_TABLET | Freq: Four times a day (QID) | ORAL | 1 refills | Status: DC | PRN

## 2018-08-22 NOTE — Progress Notes (Signed)
Subjective:    Patient ID: Sheila Wolf, female    DOB: 13-Aug-1934, 84 y.o.   MRN: [id number]  HPI 84 year old female who  has a past medical history of Anxiety and depression, Arthritis, Frequent headaches, GERD (gastroesophageal reflux disease), Hypertension, Osteoporosis, Thrombocytopathia ([place]), and Urinary incontinence. She presents to the office today for follow-up after ER visit.  She was evaluated on 08/13/2018 for chest discomfort and left chest that she felt the night before.  Discomfort was described as "heaviness".  She felt some soreness in her left shoulder and the heaviness was still present in the morning but had improved.  Chest pain was not associated with shortness of breath or vomiting.  EKG was without acute ischemic changes or evidence of pericarditis.  Her troponin was negative x2, chest x-ray without evidence of pneumonia pneumothorax.  Was thought that her chest pain was more consistent with likely GI source.  She reports that since being discharged she has not had any chest pain but has had some abdominal pain that is consistent with GERD. She is taking her Prilosec as directed. She denies SOB, nausea, vomiting, diarrhea.     Review of Systems See HPI   Past Medical History:  Diagnosis Date  . Anxiety and depression   . Arthritis   . Frequent headaches   . GERD (gastroesophageal reflux disease)   . Hypertension   . Osteoporosis   . Thrombocytopathia ([place])   . Urinary incontinence     Social History   Socioeconomic History  . Marital status: Widowed    Spouse name: Not on file  . Number of children: Not on file  . Years of education: Not on file  . Highest education level: Not on file  Occupational History  . Not on file  Social Needs  . Financial resource strain: Not on file  . Food insecurity:    Worry: Not on file    Inability: Not on file  . Transportation needs:    Medical: Not on file    Non-medical: Not on file  Tobacco Use    . Smoking status: Former Smoker    Years: 10.00    Types: Cigarettes  . Smokeless tobacco: Never Used  Substance and Sexual Activity  . Alcohol use: Yes    Alcohol/week: 10.0 standard drinks    Types: 10 Glasses of wine per week  . Drug use: No  . Sexual activity: Never  Lifestyle  . Physical activity:    Days per week: Not on file    Minutes per session: Not on file  . Stress: Not on file  Relationships  . Social connections:    Talks on phone: Not on file    Gets together: Not on file    Attends religious service: Not on file    Active member of club or organization: Not on file    Attends meetings of clubs or organizations: Not on file    Relationship status: Not on file  . Intimate partner violence:    Fear of current or ex partner: Not on file    Emotionally abused: Not on file    Physically abused: Not on file    Forced sexual activity: Not on file  Other Topics Concern  . Not on file  Social History Narrative  . Not on file    Past Surgical History:  Procedure Laterality Date  . APPENDECTOMY    . BUNIONECTOMY    . CERVICAL SPINE SURGERY    .  SHOULDER SURGERY    . TONSILLECTOMY AND ADENOIDECTOMY      Family History  Problem Relation Age of Onset  . Arthritis Mother   . Hearing loss Mother   . Heart disease Mother   . Hypertension Mother   . Alcohol abuse Father   . Early death Father   . Heart disease Father   . Stroke Father   . Birth defects Brother   . Heart disease Brother     Allergies  Allergen Reactions  . Penicillins Rash    Current Outpatient Medications on File Prior to Visit  Medication Sig Dispense Refill  . amLODipine (NORVASC) 5 MG tablet Take 1 tablet (5 mg total) by mouth daily. 90 tablet 1  . butalbital-acetaminophen-caffeine (FIORICET, ESGIC) 50-325-40 MG tablet Take 1 tablet by mouth every 6 (six) hours as needed for headache. Take 1 tablet four times daily as needed 30 tablet 1  . Calcium Carb-Cholecalciferol (CALTRATE  600+D) 600-800 MG-UNIT TABS Take 2 tablets by mouth daily. 60 tablet 3  . cetirizine (ZYRTEC) 10 MG tablet Take 1 tablet (10 mg total) by mouth daily. 90 tablet 2  . cyclobenzaprine (FLEXERIL) 5 MG tablet Take 1 tablet (5 mg total) by mouth at bedtime. 15 tablet 0  . escitalopram (LEXAPRO) 20 MG tablet Take 1 tablet (20 mg total) by mouth daily. 90 tablet 0  . fluticasone (FLONASE) 50 MCG/ACT nasal spray Place 2 sprays into both nostrils daily. 16 g 6  . Fluticasone Furoate 50 MCG/ACT AEPB Inhale into the lungs.    . meloxicam (MOBIC) 15 MG tablet Take 15 mg by mouth daily.    . metoprolol succinate (TOPROL-XL) 50 MG 24 hr tablet Take 1 tablet (50 mg total) by mouth daily. Take with or immediately following a meal. 90 tablet 3  . mirtazapine (REMERON) 15 MG tablet Take 1 tablet (15 mg total) at bedtime by mouth. 90 tablet 1  . omeprazole (PRILOSEC) 20 MG capsule Take 1 capsule (20 mg total) by mouth daily. 90 capsule 3   No current facility-administered medications on file prior to visit.     BP 128/68 (BP Location: Right Arm, Patient Position: Sitting, Cuff Size: Normal)   Pulse 62   Temp 98.8 F (37.1 C) (Oral)   Wt 129 lb 6.4 oz (58.7 kg)   SpO2 97%   BMI 23.29 kg/m       Objective:   Physical Exam  Constitutional: She is oriented to person, place, and time. She appears well-developed and well-nourished. No distress.  Cardiovascular: Normal rate, regular rhythm, normal heart sounds and intact distal pulses.  Pulmonary/Chest: Effort normal and breath sounds normal.  Abdominal: Soft. Bowel sounds are normal. She exhibits no distension and no mass. There is tenderness in the epigastric area. There is no rebound and no guarding. No hernia.  Neurological: She is alert and oriented to person, place, and time.  Skin: Skin is warm and dry. She is not diaphoretic.  Psychiatric: She has a normal mood and affect. Her behavior is normal. Judgment and thought content normal.  Nursing note and  vitals reviewed.     Assessment & Plan:  1. Gastroesophageal reflux disease with esophagitis - Will have her take Prilosec 40 mg x 1 week and then return to taking 20 mg daily.  - Follow up if pain does not resolve   2. Medication refill - butalbital-acetaminophen-caffeine (FIORICET, ESGIC) 50-325-40 MG tablet; Take 1 tablet by mouth every 6 (six) hours as needed for headache. Take  1 tablet four times daily as needed  Dispense: 30 tablet; Refill: 1  Dorothyann Peng, NP

## 2018-08-27 DIAGNOSIS — Z08 Encounter for follow-up examination after completed treatment for malignant neoplasm: Secondary | ICD-10-CM | POA: Diagnosis not present

## 2018-08-27 DIAGNOSIS — Z85828 Personal history of other malignant neoplasm of skin: Secondary | ICD-10-CM | POA: Diagnosis not present

## 2018-08-28 ENCOUNTER — Other Ambulatory Visit: Payer: Self-pay | Admitting: Adult Health

## 2018-08-28 DIAGNOSIS — I10 Essential (primary) hypertension: Secondary | ICD-10-CM

## 2018-08-28 MED ORDER — MELOXICAM 15 MG PO TABS: 15.0000 mg | ORAL_TABLET | Freq: Every day | ORAL | 1 refills | Status: DC

## 2018-08-28 MED ORDER — METOPROLOL SUCCINATE ER 50 MG PO TB24: 50.0000 mg | ORAL_TABLET | Freq: Every day | ORAL | 3 refills | Status: DC

## 2018-08-28 NOTE — Telephone Encounter (Signed)
Copied from [place] [phone]. Topic: Quick Communication - Rx Refill/Question >> Aug 28, 2018 10:02 AM Marin Olp L wrote: Medication: metoprolol succinate (TOPROL-XL) 50 MG 24 hr tablet, meloxicam (MOBIC) 15 MG tablet (out of town tomorrow)  Has the patient contacted their pharmacy? Yes.   (Agent: If no, request that the patient contact the pharmacy for the refill.) (Agent: If yes, when and what did the pharmacy advise?)  Preferred Pharmacy (with phone number or street name): [address], [place] - [place], [place] - [address] [place] [address] Phone: [phone] Fax: [phone]  Agent: Please be advised that RX refills may take up to 3 business days. We ask that you follow-up with your pharmacy.

## 2018-09-10 ENCOUNTER — Other Ambulatory Visit: Payer: Self-pay | Admitting: Family Medicine

## 2018-09-10 DIAGNOSIS — F419 Anxiety disorder, unspecified: Principal | ICD-10-CM

## 2018-09-10 DIAGNOSIS — F32A Depression, unspecified: Secondary | ICD-10-CM

## 2018-09-10 DIAGNOSIS — F329 Major depressive disorder, single episode, unspecified: Secondary | ICD-10-CM

## 2018-09-10 DIAGNOSIS — Z760 Encounter for issue of repeat prescription: Secondary | ICD-10-CM

## 2018-09-10 NOTE — Telephone Encounter (Signed)
Ok to send in for 90 days. She has an upcoming CPE

## 2018-09-10 NOTE — Telephone Encounter (Signed)
I do not see that the pt has had a physical since establishing with you. Please advise.

## 2018-09-11 MED ORDER — ESCITALOPRAM OXALATE 20 MG PO TABS: 20.0000 mg | ORAL_TABLET | Freq: Every day | ORAL | 0 refills | Status: DC

## 2018-09-11 NOTE — Telephone Encounter (Signed)
Sent to the pharmacy by e-scribe. 

## 2018-09-19 DIAGNOSIS — H26493 Other secondary cataract, bilateral: Secondary | ICD-10-CM | POA: Diagnosis not present

## 2018-09-19 DIAGNOSIS — H5213 Myopia, bilateral: Secondary | ICD-10-CM | POA: Diagnosis not present

## 2018-09-25 ENCOUNTER — Encounter: Payer: Medicare Other | Admitting: Adult Health

## 2018-09-25 NOTE — Progress Notes (Deleted)
   Subjective:    Patient ID: Sheila Wolf, female    DOB: [birth year], 84 y.o.   MRN: [id number]  HPI  Patient presents for yearly preventative medicine examination. She is a pleasant 84 year old female who  has a past medical history of Anxiety and depression, Arthritis, Frequent headaches, GERD (gastroesophageal reflux disease), Hypertension, Osteoporosis, Thrombocytopathia ([place]), and Urinary incontinence.    All immunizations and health maintenance protocols were reviewed with the patient and needed orders were placed.  Appropriate screening laboratory values were ordered for the patient including screening of hyperlipidemia, renal function and hepatic function. If indicated by BPH, a PSA was ordered.  Medication reconciliation,  past medical history, social history, problem list and allergies were reviewed in detail with the patient  Goals were established with regard to weight loss, exercise, and  diet in compliance with medications  End of life planning was discussed.    Review of Systems     Objective:   Physical Exam        Assessment & Plan:

## 2018-10-01 ENCOUNTER — Other Ambulatory Visit: Payer: Self-pay | Admitting: Family Medicine

## 2018-10-01 DIAGNOSIS — Z760 Encounter for issue of repeat prescription: Secondary | ICD-10-CM

## 2018-10-01 MED ORDER — BUTALBITAL-APAP-CAFFEINE 50-325-40 MG PO TABS: 1.0000 | ORAL_TABLET | Freq: Four times a day (QID) | ORAL | 1 refills | Status: DC | PRN

## 2018-10-08 DIAGNOSIS — H26491 Other secondary cataract, right eye: Secondary | ICD-10-CM | POA: Diagnosis not present

## 2018-10-16 ENCOUNTER — Telehealth: Payer: Self-pay | Admitting: Adult Health

## 2018-10-16 ENCOUNTER — Encounter: Payer: Self-pay | Admitting: Adult Health

## 2018-10-16 ENCOUNTER — Ambulatory Visit (INDEPENDENT_AMBULATORY_CARE_PROVIDER_SITE_OTHER): Payer: Medicare Other | Admitting: Adult Health

## 2018-10-16 VITALS — BP 148/60 | Temp 98.1°F | Ht 62.5 in | Wt 129.0 lb

## 2018-10-16 DIAGNOSIS — I10 Essential (primary) hypertension: Secondary | ICD-10-CM | POA: Diagnosis not present

## 2018-10-16 DIAGNOSIS — E782 Mixed hyperlipidemia: Secondary | ICD-10-CM

## 2018-10-16 DIAGNOSIS — F419 Anxiety disorder, unspecified: Secondary | ICD-10-CM | POA: Diagnosis not present

## 2018-10-16 DIAGNOSIS — F329 Major depressive disorder, single episode, unspecified: Secondary | ICD-10-CM | POA: Diagnosis not present

## 2018-10-16 DIAGNOSIS — Z760 Encounter for issue of repeat prescription: Secondary | ICD-10-CM | POA: Diagnosis not present

## 2018-10-16 DIAGNOSIS — F32A Depression, unspecified: Secondary | ICD-10-CM

## 2018-10-16 DIAGNOSIS — E559 Vitamin D deficiency, unspecified: Secondary | ICD-10-CM

## 2018-10-16 DIAGNOSIS — K2100 Gastro-esophageal reflux disease with esophagitis, without bleeding: Secondary | ICD-10-CM

## 2018-10-16 LAB — BASIC METABOLIC PANEL
BUN: 24 mg/dL — ABNORMAL HIGH (ref 6–23)
CALCIUM: 9.3 mg/dL (ref 8.4–10.5)
CHLORIDE: 104 meq/L (ref 96–112)
CO2: 31 meq/L (ref 19–32)
Creatinine, Ser: 0.69 mg/dL (ref 0.40–1.20)
GFR: 86.49 mL/min (ref >60.00)
Glucose, Bld: 97 mg/dL (ref 70–99)
Potassium: 5 mEq/L (ref 3.5–5.1)
SODIUM: 141 meq/L (ref 135–145)

## 2018-10-16 LAB — CBC WITH DIFFERENTIAL/PLATELET
BASOS ABS: 0 10*3/uL (ref 0.0–0.1)
Basophils Relative: 0.9 % (ref 0.0–3.0)
Eosinophils Absolute: 0.1 10*3/uL (ref 0.0–0.7)
Eosinophils Relative: 2.6 % (ref 0.0–5.0)
HCT: 39.7 % (ref 36.0–46.0)
Hemoglobin: 13.2 g/dL (ref 12.0–15.0)
Lymphocytes Relative: 34.2 % (ref 12.0–46.0)
Lymphs Abs: 1.7 10*3/uL (ref 0.7–4.0)
MCHC: 33.3 g/dL (ref 30.0–36.0)
MCV: 98 fl (ref 78.0–100.0)
Monocytes Absolute: 0.6 10*3/uL (ref 0.1–1.0)
Monocytes Relative: 12.4 % — ABNORMAL HIGH (ref 3.0–12.0)
NEUTROS ABS: 2.4 10*3/uL (ref 1.4–7.7)
Neutrophils Relative %: 49.9 % (ref 43.0–77.0)
PLATELETS: 137 10*3/uL — AB (ref 150.0–400.0)
RBC: 4.05 Mil/uL (ref 3.87–5.11)
RDW: 14.3 % (ref 11.5–15.5)
WBC: 4.9 10*3/uL (ref 4.0–10.5)

## 2018-10-16 LAB — VITAMIN D 25 HYDROXY (VIT D DEFICIENCY, FRACTURES): VITD: 36 ng/mL (ref 30.00–100.00)

## 2018-10-16 LAB — HEPATIC FUNCTION PANEL
ALBUMIN: 4.3 g/dL (ref 3.5–5.2)
ALK PHOS: 51 U/L (ref 39–117)
ALT: 10 U/L (ref 0–35)
AST: 18 U/L (ref 0–37)
Bilirubin, Direct: 0.1 mg/dL (ref 0.0–0.3)
Total Bilirubin: 0.6 mg/dL (ref 0.2–1.2)
Total Protein: 6.6 g/dL (ref 6.0–8.3)

## 2018-10-16 LAB — LIPID PANEL
CHOL/HDL RATIO: 2
Cholesterol: 242 mg/dL — ABNORMAL HIGH (ref 0–200)
HDL: 103.6 mg/dL (ref >39.00)
LDL Cholesterol: 123 mg/dL — ABNORMAL HIGH (ref 0–99)
NONHDL: 138.12
TRIGLYCERIDES: 78 mg/dL (ref 0.0–149.0)
VLDL: 15.6 mg/dL (ref 0.0–40.0)

## 2018-10-16 LAB — TSH: TSH: 1.01 u[IU]/mL (ref 0.35–4.50)

## 2018-10-16 MED ORDER — MIRTAZAPINE 15 MG PO TABS: 15.0000 mg | ORAL_TABLET | Freq: Every day | ORAL | 1 refills | Status: DC

## 2018-10-16 NOTE — Telephone Encounter (Signed)
Pt notified that she should have refills of all on file.

## 2018-10-16 NOTE — Telephone Encounter (Signed)
Please advise 

## 2018-10-16 NOTE — Progress Notes (Signed)
Subjective:    Patient ID: Sheila Wolf, female    DOB: 05/14/1934, 84 y.o.   MRN: [id number]  HPI Patient presents for yearly preventative medicine examination. 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   Anxiety/Depression - Takes Lexapro 20mg  and Remeron 15 mg. - stable   GERD - takes Prilosec 20 mg daily - stable   Essential Hypertension - Controlled with Norvasc 5 mg and Toptol 50 mg. Has not taken her medication today  BP Readings from Last 3 Encounters:  10/16/18 (!) 148/60  08/22/18 128/68  08/13/18 (!) 172/76   Hyperlipidemia - Not currently prescribed medication  Lab Results  Component Value Date   CHOL 266 (H) 10/05/2017   HDL 120.40 10/05/2017   LDLCALC 134 (H) 10/05/2017   TRIG 59.0 10/05/2017   CHOLHDL 2 10/05/2017   Vitamin D deficiency - Takes OTC supplement   All immunizations and health maintenance protocols were reviewed with the patient and needed orders were placed. She believes that she is UTD on vaccinations. She will check with her pharmacy   Appropriate screening laboratory values were ordered for the patient including screening of hyperlipidemia, renal function and hepatic function.  Medication reconciliation,  past medical history, social history, problem list and allergies were reviewed in detail with the patient  Goals were established with regard to weight loss, exercise, and  diet in compliance with medications. She continues to stay active and walks her dog about 4 times a day. She eats a heart healthy diet   End of life planning was discussed. She has an advanced directive and living will  Review of Systems  Constitutional: Negative.   HENT: Negative.   Eyes: Negative.   Respiratory: Negative.   Cardiovascular: Negative.     Gastrointestinal: Negative.   Endocrine: Negative.   Genitourinary: Negative.   Musculoskeletal: Positive for arthralgias (left knee).  Skin: Negative.   Allergic/Immunologic: Negative.   Neurological: Negative.   Hematological: Negative.   Psychiatric/Behavioral: Negative.    Past Medical History:  Diagnosis Date  . Anxiety and depression   . Arthritis   . Frequent headaches   . GERD (gastroesophageal reflux disease)   . Hypertension   . Osteoporosis   . 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 Urinary incontinence     Social History   Socioeconomic History  . Marital status: Widowed    Spouse name: Not on file  . Number of children: Not on file  . Years of education: Not on file  . Highest education level: Not on file  Occupational History  . Not on file  Social Needs  . Financial resource strain: Not on file  . Food insecurity:    Worry: Not on file    Inability: Not on file  . Transportation needs:    Medical: Not on file    Non-medical: Not on file  Tobacco Use  . Smoking status: Former Smoker    Years: 10.00    Types: Cigarettes  . Smokeless tobacco: Never Used  Substance and Sexual Activity  . Alcohol use: Yes    Alcohol/week: 10.0 standard drinks    Types: 10 Glasses of wine per week  . Drug use: No  . Sexual activity: Never  Lifestyle  . Physical activity:    Days per week: Not on file    Minutes per session: Not  on file  . Stress: Not on file  Relationships  . Social connections:    Talks on phone: Not on file    Gets together: Not on file    Attends religious service: Not on file    Active member of club or organization: Not on file    Attends meetings of clubs or organizations: Not on file    Relationship status: Not on file  . Intimate partner violence:    Fear of current or ex partner: Not on file    Emotionally abused: Not on file    Physically abused: Not on file    Forced sexual activity: Not on file  Other Topics Concern  . Not on file   Social History Narrative  . Not on file    Past Surgical History:  Procedure Laterality Date  . APPENDECTOMY    . BUNIONECTOMY    . CERVICAL SPINE SURGERY    . SHOULDER SURGERY    . TONSILLECTOMY AND ADENOIDECTOMY      Family History  Problem Relation Age of Onset  . Arthritis Mother   . Hearing loss Mother   . Heart disease Mother   . Hypertension Mother   . Alcohol abuse Father   . Early death Father   . Heart disease Father   . Stroke Father   . Birth defects Brother   . Heart disease Brother     Allergies  Allergen Reactions  . Penicillins Rash    Current Outpatient Medications on File Prior to Visit  Medication Sig Dispense Refill  . amLODipine (NORVASC) 5 MG tablet Take 1 tablet (5 mg total) by mouth daily. 90 tablet 1  . butalbital-acetaminophen-caffeine (FIORICET, ESGIC) 50-325-40 MG tablet Take 1 tablet by mouth every 6 (six) hours as needed for headache. Take 1 tablet four times daily as needed 30 tablet 1  . Calcium Carb-Cholecalciferol (CALTRATE 600+D) 600-800 MG-UNIT TABS Take 2 tablets by mouth daily. 60 tablet 3  . cetirizine (ZYRTEC) 10 MG tablet Take 1 tablet (10 mg total) by mouth daily. 90 tablet 2  . cyclobenzaprine (FLEXERIL) 5 MG tablet Take 1 tablet (5 mg total) by mouth at bedtime. 15 tablet 0  . escitalopram (LEXAPRO) 20 MG tablet Take 1 tablet (20 mg total) by mouth daily. 90 tablet 0  . fluticasone (FLONASE) 50 MCG/ACT nasal spray Place 2 sprays into both nostrils daily. 16 g 6  . Fluticasone Furoate 50 MCG/ACT AEPB Inhale into the lungs.    . meloxicam (MOBIC) 15 MG tablet Take 1 tablet (15 mg total) by mouth daily. 90 tablet 1  . metoprolol succinate (TOPROL-XL) 50 MG 24 hr tablet Take 1 tablet (50 mg total) by mouth daily. Take with or immediately following a meal. 90 tablet 3  . omeprazole (PRILOSEC) 20 MG capsule Take 1 capsule (20 mg total) by mouth daily. 90 capsule 3   No current facility-administered medications on file prior to  visit.     BP (!) 148/60   Temp 98.1 F (36.7 C)   Ht 5' 2.5" (1.588 m)   Wt 129 lb (58.5 kg)   BMI 23.22 kg/m       Objective:   Physical Exam  Constitutional: She is oriented to person, place, and time. She appears well-developed and well-nourished. No distress.  HENT:  Head: Normocephalic and atraumatic.  Right Ear: External ear normal.  Left Ear: External ear normal.  Nose: Nose normal.  Mouth/Throat: Oropharynx is clear and moist. No oropharyngeal exudate.  Eyes: Pupils are equal, round, and reactive to light. Conjunctivae and EOM are normal. Right eye exhibits no discharge. Left eye exhibits no discharge. No scleral icterus.  Neck: Normal range of motion. Neck supple. No JVD present. No tracheal deviation present. No thyromegaly present.  Cardiovascular: Normal rate, regular rhythm, normal heart sounds and intact distal pulses. Exam reveals no gallop and no friction rub.  No murmur heard. Pulmonary/Chest: Effort normal and breath sounds normal. No stridor. No respiratory distress. She has no wheezes. She has no rales. She exhibits no tenderness.  Abdominal: Soft. Bowel sounds are normal. She exhibits no distension and no mass. There is no tenderness. There is no rebound and no guarding. No hernia.  Musculoskeletal: Normal range of motion. She exhibits no edema, tenderness or deformity.  Lymphadenopathy:    She has no cervical adenopathy.  Neurological: She is alert and oriented to person, place, and time. She displays normal reflexes. No cranial nerve deficit or sensory deficit. She exhibits normal muscle tone. Coordination normal.  Skin: Skin is warm and dry. Capillary refill takes less than 2 seconds. No rash noted. She is not diaphoretic. No erythema. No pallor.  Psychiatric: She has a normal mood and affect. Her behavior is normal. Judgment and thought content normal.  Nursing note and vitals reviewed.     Assessment & Plan:  1. Anxiety and depression - Well  controlled. No change in medications  - mirtazapine (REMERON) 15 MG tablet; Take 1 tablet (15 mg total) by mouth at bedtime.  Dispense: 90 tablet; Refill: 1  2. Essential hypertension - At goal.  No change in medications  - Basic metabolic panel - CBC with Differential/Platelet - Hepatic function panel - TSH - Lipid panel - Vitamin D, 25-hydroxy  3. Gastroesophageal reflux disease with esophagitis - continue with prilosec  - Basic metabolic panel - CBC with Differential/Platelet - Hepatic function panel - TSH - Lipid panel  4. Mixed hyperlipidemia - Consider statin  - Basic metabolic panel - CBC with Differential/Platelet - Hepatic function panel - TSH - Lipid panel - Vitamin D, 25-hydroxy  5. Medication refill  - mirtazapine (REMERON) 15 MG tablet; Take 1 tablet (15 mg total) by mouth at bedtime.  Dispense: 90 tablet; Refill: 1  6. Vitamin D deficiency  - Vitamin D, 25-hydroxy  Dorothyann Peng, NP

## 2018-10-16 NOTE — Telephone Encounter (Signed)
Copied from [place] [id number]. Topic: Quick Communication - Rx Refill/Question >> Oct 16, 2018 12:45 PM Scherrie Gerlach wrote: Medication: escitalopram (LEXAPRO) 20 MG tablet cetirizine (ZYRTEC) 10 MG tablet Pt just seen and states these are the 2 meds she forgot to tell [name] she needed refill on. [address], [place] - [place], [place] - [address] [phone] (Phone) [phone] (Fax)

## 2018-10-16 NOTE — Telephone Encounter (Signed)
Patient called back to inform Misty she made a mistake and she only needs  butalbital-acetaminophen-caffeine (FIORICET, ESGIC) 50-325-40 MG tablet and amLODipine (NORVASC) 5 MG tablet refilled   [facility], [place] - [place], [place] - [address]  [address] [place] [address]  Phone: [phone] Fax: [phone]  Not a 24 hour pharmacy; exact hours not known.

## 2018-10-16 NOTE — Telephone Encounter (Signed)
Patient called back and insisted she speak with misty. She is confused to as why she was prescribed mirtazapine (REMERON) 15 MG tablet. She said she just does not recall talking about that and changing her meds.

## 2018-10-16 NOTE — Telephone Encounter (Signed)
I spoke to the Sheila Wolf and informed her of Cory's directions and that the medication was for depression.[name] said if she has not been taking it then she does not need to restart.  If she has been taking it then she should continue.  She was standing in the pharmacy and they were able to pull up her refill history.  She has been getting refills so she will continue the medication.  Nothing further needed at this time.

## 2018-10-23 ENCOUNTER — Telehealth: Payer: Self-pay | Admitting: Adult Health

## 2018-10-23 NOTE — Telephone Encounter (Signed)
Copied from [place] [id number]. Topic: Quick Communication - See Telephone Encounter >> Oct 23, 2018  4:54 PM Ivar Drape wrote: CRM for notification. See Telephone encounter for: 10/23/18. Patient stated that she has had the Prevenar 13, but she needs Pneumonia 23.

## 2018-10-24 NOTE — Telephone Encounter (Signed)
Patient states she will get that documentation.

## 2018-10-24 NOTE — Telephone Encounter (Signed)
Do I need to do anything ?

## 2018-10-24 NOTE — Telephone Encounter (Signed)
Left message for pt to return phone call. Pt does not have the Prevnar 13 in our system. Pt will need documentation stating that she received this vaccine.

## 2018-10-25 NOTE — Telephone Encounter (Signed)
Sheila Wolf calling from [facility] [place], [place].  Prevnar 13-07/29/14 Pneumonia Vac 5/13 [phone]

## 2018-10-29 DIAGNOSIS — H26492 Other secondary cataract, left eye: Secondary | ICD-10-CM | POA: Diagnosis not present

## 2018-10-29 NOTE — Telephone Encounter (Signed)
Noted  

## 2018-11-08 NOTE — Telephone Encounter (Signed)
Informed patient her chart has been updated regarding the information mentioned below and advise she can go to her local pharmacy to receive her Pneumonia 23.

## 2018-12-13 ENCOUNTER — Encounter: Payer: Self-pay | Admitting: Adult Health

## 2018-12-13 ENCOUNTER — Ambulatory Visit (INDEPENDENT_AMBULATORY_CARE_PROVIDER_SITE_OTHER): Payer: Medicare Other | Admitting: Adult Health

## 2018-12-13 VITALS — BP 110/60 | HR 66 | Temp 98.3°F | Ht 62.5 in | Wt 130.0 lb

## 2018-12-13 DIAGNOSIS — H6121 Impacted cerumen, right ear: Secondary | ICD-10-CM | POA: Diagnosis not present

## 2018-12-13 DIAGNOSIS — M25531 Pain in right wrist: Secondary | ICD-10-CM

## 2018-12-13 DIAGNOSIS — Z760 Encounter for issue of repeat prescription: Secondary | ICD-10-CM

## 2018-12-13 MED ORDER — BUTALBITAL-APAP-CAFFEINE 50-325-40 MG PO TABS: 1.0000 | ORAL_TABLET | Freq: Four times a day (QID) | ORAL | 1 refills | Status: DC | PRN

## 2018-12-13 NOTE — Progress Notes (Signed)
Subjective:    Patient ID: Sheila Wolf, female    DOB: [birth year], 85 y.o.   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   She presents to the office today for a few acute issues.   1. Decreased hearing in her right ear. Noticed yesterday that she had decreased hearing and a feeling of ear fullness. She denies pain or drainage from the ear. She often has to get her ears cleaned due to wax build up   2. Right wrist pain x 1 month. She reports that she had a mechanical fall about a month ago and tried to brace her fall. When she fell she hit her hand with the palm down on the ground. Since that time she has had a feeling of " soreness" in her right wrist, that has been improving. She denies decreased ROM, decreased grip strength, bruising, or swelling. She has not been using any OTC medications for pain relief    Review of Systems See HPI   Past Medical History:  Diagnosis Date  . Anxiety and depression   . Arthritis   . Frequent headaches   . GERD (gastroesophageal reflux disease)   . Hypertension   . Osteoporosis   . 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 Urinary incontinence     Social History   Socioeconomic History  . Marital status: Widowed    Spouse name: Not on file  . Number of children: Not on file  . Years of education: Not on file  . Highest education level: Not on file  Occupational History  . Not on file  Social Needs  . Financial resource strain: Not on file  . Food insecurity:    Worry: Not on file    Inability: Not on file  . Transportation needs:    Medical: Not on file    Non-medical: Not on file  Tobacco Use  . Smoking status: Former Smoker    Years: 10.00    Types: Cigarettes  . Smokeless tobacco: Never Used  Substance and Sexual  Activity  . Alcohol use: Yes    Alcohol/week: 10.0 standard drinks    Types: 10 Glasses of wine per week  . Drug use: No  . Sexual activity: Never  Lifestyle  . Physical activity:    Days per week: Not on file    Minutes per session: Not on file  . Stress: Not on file  Relationships  . Social connections:    Talks on phone: Not on file    Gets together: Not on file    Attends religious service: Not on file    Active member of club or organization: Not on file    Attends meetings of clubs or organizations: Not on file    Relationship status: Not on file  . Intimate partner violence:    Fear of current or ex partner: Not on file    Emotionally abused: Not on file    Physically abused: Not on file    Forced sexual activity: Not on file  Other Topics Concern  . Not on file  Social History Narrative  . Not on file    Past Surgical History:  Procedure Laterality Date  . APPENDECTOMY    . BUNIONECTOMY    . CERVICAL SPINE SURGERY    . SHOULDER SURGERY    .  TONSILLECTOMY AND ADENOIDECTOMY      Family History  Problem Relation Age of Onset  . Arthritis Mother   . Hearing loss Mother   . Heart disease Mother   . Hypertension Mother   . Alcohol abuse Father   . Early death Father   . Heart disease Father   . Stroke Father   . Birth defects Brother   . Heart disease Brother     Allergies  Allergen Reactions  . Penicillins Rash    Current Outpatient Medications on File Prior to Visit  Medication Sig Dispense Refill  . amLODipine (NORVASC) 5 MG tablet Take 1 tablet (5 mg total) by mouth daily. 90 tablet 1  . butalbital-acetaminophen-caffeine (FIORICET, ESGIC) 50-325-40 MG tablet Take 1 tablet by mouth every 6 (six) hours as needed for headache. Take 1 tablet four times daily as needed 30 tablet 1  . escitalopram (LEXAPRO) 20 MG tablet Take 1 tablet (20 mg total) by mouth daily. 90 tablet 0  . fluticasone (FLONASE) 50 MCG/ACT nasal spray Place 2 sprays into both  nostrils daily. 16 g 6  . meloxicam (MOBIC) 15 MG tablet Take 1 tablet (15 mg total) by mouth daily. 90 tablet 1  . metoprolol succinate (TOPROL-XL) 50 MG 24 hr tablet Take 1 tablet (50 mg total) by mouth daily. Take with or immediately following a meal. 90 tablet 3  . NON FORMULARY Juice plus supplement    . omeprazole (PRILOSEC) 20 MG capsule Take 1 capsule (20 mg total) by mouth daily. 90 capsule 3  . Saccharomyces boulardii (PROBIOTIC) 250 MG CAPS Take 250 mg by mouth.    . cetirizine (ZYRTEC) 10 MG tablet Take 1 tablet (10 mg total) by mouth daily. 90 tablet 2   No current facility-administered medications on file prior to visit.     BP 110/60 (BP Location: Left Arm, Patient Position: Sitting, Cuff Size: Normal)   Pulse 66   Temp 98.3 F (36.8 C) (Oral)   Ht 5' 2.5" (1.588 m)   Wt 130 lb (59 kg)   BMI 23.40 kg/m       Objective:   Physical Exam Vitals signs and nursing note reviewed.  Constitutional:      Appearance: Normal appearance.  HENT:     Right Ear: External ear normal. There is impacted cerumen.     Left Ear: Tympanic membrane, ear canal and external ear normal.  Cardiovascular:     Rate and Rhythm: Normal rate and regular rhythm.     Pulses: Normal pulses.     Heart sounds: Normal heart sounds.  Pulmonary:     Effort: Pulmonary effort is normal.     Breath sounds: Normal breath sounds.  Musculoskeletal: Normal range of motion.        General: No swelling, tenderness, deformity or signs of injury.     Right wrist: Normal. She exhibits normal range of motion, no tenderness, no bony tenderness, no swelling, no effusion, no crepitus and no deformity.  Skin:    Capillary Refill: Capillary refill takes less than 2 seconds.  Neurological:     Mental Status: She is alert.       Assessment & Plan:  1. Impacted cerumen of right ear - Consent obtained. Cerumen impaction was easily removed with curette. Patient tolerated procedure well and reported improvement in  hearing. No signs of infection or trauma noted.   2. Medication refill  - butalbital-acetaminophen-caffeine (FIORICET, ESGIC) 50-325-40 MG tablet; Take 1 tablet by mouth every 6 (  six) hours as needed for headache. Take 1 tablet four times daily as needed  Dispense: 30 tablet; Refill: 1  3. Right wrist pain - Appears to be improving. Likely sprain from fall. Advised motrin and ice. There is no concern for fracture or dislocation.   Dorothyann Peng, NP

## 2018-12-19 ENCOUNTER — Ambulatory Visit: Payer: Medicare Other | Admitting: Adult Health

## 2018-12-31 DIAGNOSIS — Z08 Encounter for follow-up examination after completed treatment for malignant neoplasm: Secondary | ICD-10-CM | POA: Diagnosis not present

## 2018-12-31 DIAGNOSIS — Z85828 Personal history of other malignant neoplasm of skin: Secondary | ICD-10-CM | POA: Diagnosis not present

## 2018-12-31 DIAGNOSIS — L508 Other urticaria: Secondary | ICD-10-CM | POA: Diagnosis not present

## 2019-01-02 ENCOUNTER — Other Ambulatory Visit: Payer: Self-pay | Admitting: Family Medicine

## 2019-01-02 DIAGNOSIS — Z760 Encounter for issue of repeat prescription: Secondary | ICD-10-CM

## 2019-01-02 MED ORDER — BUTALBITAL-APAP-CAFFEINE 50-325-40 MG PO TABS: 1.0000 | ORAL_TABLET | Freq: Four times a day (QID) | ORAL | 1 refills | Status: DC | PRN

## 2019-01-08 ENCOUNTER — Ambulatory Visit (INDEPENDENT_AMBULATORY_CARE_PROVIDER_SITE_OTHER): Payer: Medicare Other | Admitting: Adult Health

## 2019-01-08 ENCOUNTER — Encounter: Payer: Self-pay | Admitting: Adult Health

## 2019-01-08 VITALS — BP 160/80 | Temp 98.0°F | Wt 132.0 lb

## 2019-01-08 DIAGNOSIS — L299 Pruritus, unspecified: Secondary | ICD-10-CM | POA: Diagnosis not present

## 2019-01-08 NOTE — Addendum Note (Signed)
Addended by: Apolinar Junes on: 01/08/2019 04:29 PM   Modules accepted: Level of Service

## 2019-01-08 NOTE — Progress Notes (Signed)
Subjective:    Patient ID: Sheila Wolf, female    DOB: Mar 31, 1934, 85 y.o.   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  She presents to the office today for an acute issue of bilateral palm itching, R>L.  Itching started approximately 1 week ago, she was seen by a dermatologist who prescribed her cortisone cream, she has been applying this for the last week and notices some improvement when she first puts it on her hands.  Denies any changes in detergents, soaps, or lotions.  She has not noticed any type of rash or redness.  The only change in her medication was at approximately the same time she started using Prevagen to help with memory.   She has not had any peeling of her hands   Review of Systems See HPI   Past Medical History:  Diagnosis Date  . Anxiety and depression   . Arthritis   . Frequent headaches   . GERD (gastroesophageal reflux disease)   . Hypertension   . Osteoporosis   . 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 Urinary incontinence     Social History   Socioeconomic History  . Marital status: Widowed    Spouse name: Not on file  . Number of children: Not on file  . Years of education: Not on file  . Highest education level: Not on file  Occupational History  . Not on file  Social Needs  . Financial resource strain: Not on file  . Food insecurity:    Worry: Not on file    Inability: Not on file  . Transportation needs:    Medical: Not on file    Non-medical: Not on file  Tobacco Use  . Smoking status: Former Smoker    Years: 10.00    Types: Cigarettes  . Smokeless tobacco: Never Used  Substance and Sexual Activity  . Alcohol use: Yes    Alcohol/week: 10.0 standard drinks    Types: 10 Glasses of wine per week  . Drug use: No  . Sexual  activity: Never  Lifestyle  . Physical activity:    Days per week: Not on file    Minutes per session: Not on file  . Stress: Not on file  Relationships  . Social connections:    Talks on phone: Not on file    Gets together: Not on file    Attends religious service: Not on file    Active member of club or organization: Not on file    Attends meetings of clubs or organizations: Not on file    Relationship status: Not on file  . Intimate partner violence:    Fear of current or ex partner: Not on file    Emotionally abused: Not on file    Physically abused: Not on file    Forced sexual activity: Not on file  Other Topics Concern  . Not on file  Social History Narrative  . Not on file    Past Surgical History:  Procedure Laterality Date  . APPENDECTOMY    . BUNIONECTOMY    . CERVICAL SPINE SURGERY    . SHOULDER SURGERY    . TONSILLECTOMY AND ADENOIDECTOMY      Family History  Problem Relation Age of Onset  . Arthritis Mother   . Hearing loss Mother   . Heart disease Mother   .  Hypertension Mother   . Alcohol abuse Father   . Early death Father   . Heart disease Father   . Stroke Father   . Birth defects Brother   . Heart disease Brother     Allergies  Allergen Reactions  . Penicillins Rash    Current Outpatient Medications on File Prior to Visit  Medication Sig Dispense Refill  . amLODipine (NORVASC) 5 MG tablet Take 1 tablet (5 mg total) by mouth daily. 90 tablet 1  . betamethasone dipropionate (DIPROLENE) 0.05 % cream APPLY TO AFFECTED AREA UP TO 2 TIMES DAILY AS NEEDED. DO NOT APPLY TO FACE, GROIN, AND UNDERARM    . butalbital-acetaminophen-caffeine (FIORICET, ESGIC) 50-325-40 MG tablet Take 1 tablet by mouth every 6 (six) hours as needed for headache. Take 1 tablet four times daily as needed 30 tablet 1  . escitalopram (LEXAPRO) 20 MG tablet Take 1 tablet (20 mg total) by mouth daily. 90 tablet 0  . fluticasone (FLONASE) 50 MCG/ACT nasal spray Place 2 sprays  into both nostrils daily. 16 g 6  . meloxicam (MOBIC) 15 MG tablet Take 1 tablet (15 mg total) by mouth daily. 90 tablet 1  . metoprolol succinate (TOPROL-XL) 50 MG 24 hr tablet Take 1 tablet (50 mg total) by mouth daily. Take with or immediately following a meal. 90 tablet 3  . NON FORMULARY Juice plus supplement    . omeprazole (PRILOSEC) 20 MG capsule Take 1 capsule (20 mg total) by mouth daily. 90 capsule 3  . Saccharomyces boulardii (PROBIOTIC) 250 MG CAPS Take 250 mg by mouth.    . cetirizine (ZYRTEC) 10 MG tablet Take 1 tablet (10 mg total) by mouth daily. 90 tablet 2   No current facility-administered medications on file prior to visit.     BP (!) 160/80   Temp 98 F (36.7 C)   Wt 132 lb (59.9 kg)   BMI 23.76 kg/m       Objective:   Physical Exam Vitals signs and nursing note reviewed.  Constitutional:      Appearance: Normal appearance.  Skin:    General: Skin is warm and dry.     Capillary Refill: Capillary refill takes less than 2 seconds.     Coloration: Skin is not pale.     Findings: No erythema, lesion or rash.  Neurological:     General: No focal deficit present.     Mental Status: She is alert and oriented to person, place, and time.  Psychiatric:        Mood and Affect: Mood normal.        Behavior: Behavior normal.        Thought Content: Thought content normal.        Judgment: Judgment normal.       Assessment & Plan:  1. Pruritus -Signs or symptoms of rash or contact dermatitis.  I will have her stop Prevagen could possibly be having an allergic reaction from this?  Continue to place cortisone cream on hands twice a day and can also get Benadryl cream to help with itching at the pharmacy.  Advised follow-up if no improvement  Dorothyann Peng, NP

## 2019-01-20 DIAGNOSIS — N760 Acute vaginitis: Secondary | ICD-10-CM | POA: Diagnosis not present

## 2019-01-22 ENCOUNTER — Other Ambulatory Visit: Payer: Self-pay | Admitting: Family Medicine

## 2019-01-22 DIAGNOSIS — Z760 Encounter for issue of repeat prescription: Secondary | ICD-10-CM

## 2019-01-22 DIAGNOSIS — I10 Essential (primary) hypertension: Secondary | ICD-10-CM

## 2019-01-22 MED ORDER — AMLODIPINE BESYLATE 5 MG PO TABS: 5.0000 mg | ORAL_TABLET | Freq: Every day | ORAL | 1 refills | Status: DC

## 2019-01-22 MED ORDER — BUTALBITAL-APAP-CAFFEINE 50-325-40 MG PO TABS: 1.0000 | ORAL_TABLET | Freq: Four times a day (QID) | ORAL | 1 refills | Status: DC | PRN

## 2019-02-24 ENCOUNTER — Telehealth: Payer: Self-pay | Admitting: Adult Health

## 2019-02-24 ENCOUNTER — Ambulatory Visit: Payer: Self-pay

## 2019-02-24 DIAGNOSIS — Z760 Encounter for issue of repeat prescription: Secondary | ICD-10-CM

## 2019-02-24 NOTE — Telephone Encounter (Signed)
The patient called in today to make an appointment but decided that she was feeling a little better after taking tylenol and putting heat on her ear. She stated that she will call back tomorrow if her ear is hurting again.   She also needs a refill   butalbital-acetaminophen-caffeine (FIORICET, ESGIC) 50-325-40 MG tablet   Sent to:  [facility] [place], [place] - [address] [phone] (Phone) [phone] (Fax)

## 2019-02-24 NOTE — Telephone Encounter (Signed)
Pt c/o left ear ache. Pt stated t started yesterday. No fever or drainage or headache. No other symptoms.  Pt stated she has taken Acetaminophen for the discomfort. She stated the pain is mild but did not want to wait in seeking care. Care advice given and routing note to the office for virtual appt. Email is on file.  Reason for Disposition . Earache  (Exceptions: brief ear pain of < 60 minutes duration, earache occurring during air travel  Answer Assessment - Initial Assessment Questions 1. LOCATION: "Which ear is involved?"     Left ear 2. ONSET: "When did the ear start hurting"      yesterday 3. SEVERITY: "How bad is the pain?"  (Scale 1-10; mild, moderate or severe)   - MILD (1-3): doesn't interfere with normal activities    - MODERATE (4-7): interferes with normal activities or awakens from sleep    - SEVERE (8-10): excruciating pain, unable to do any normal activities      mild 4. URI SYMPTOMS: " Do you have a runny nose or cough?"     cough 5. FEVER: "Do you have a fever?" If so, ask: "What is your temperature, how was it measured, and when did it start?"     no 6. CAUSE: "Have you been swimming recently?", "How often do you use Q-TIPS?", "Have you had any recent air travel or scuba diving?"     No-occasionally-no 7. OTHER SYMPTOMS: "Do you have any other symptoms?" (e.g., headache, stiff neck, dizziness, vomiting, runny nose, decreased hearing)     no 8. PREGNANCY: "Is there any chance you are pregnant?" "When was your last menstrual period?"     n/a  Protocols used: EARACHE-A-AH

## 2019-02-24 NOTE — Telephone Encounter (Signed)
[name], called the pharmacy.  You sent in #30 with 1 additional refill on 01/22/2019.  She picked up one refill on 3/4 and the other on 3/25.  Please advise.

## 2019-02-24 NOTE — Telephone Encounter (Signed)
LM for patient to return call.

## 2019-02-25 ENCOUNTER — Other Ambulatory Visit: Payer: Self-pay | Admitting: Adult Health

## 2019-02-25 DIAGNOSIS — Z760 Encounter for issue of repeat prescription: Secondary | ICD-10-CM

## 2019-02-25 MED ORDER — BUTALBITAL-APAP-CAFFEINE 50-325-40 MG PO TABS: 1.0000 | ORAL_TABLET | Freq: Four times a day (QID) | ORAL | 0 refills | Status: DC | PRN

## 2019-02-25 NOTE — Telephone Encounter (Signed)
[name], computer will not allow me to send electronically.  Please send

## 2019-02-25 NOTE — Telephone Encounter (Signed)
Ok to refill for 30 days  If she is having that many headaches then lets do a phone call or evisit

## 2019-02-25 NOTE — Telephone Encounter (Signed)
Spoke to the pt.  She states she has been using everyday.  Advised that she should not be doing that and that Fioricet is a PRN medication.  She admits she has been taking too often and will cut back.  Would like Cory to refill. Please advise.

## 2019-02-25 NOTE — Telephone Encounter (Signed)
She should have plenty. Please call and see if she is having that many headaches

## 2019-02-25 NOTE — Telephone Encounter (Signed)
Sent in

## 2019-02-25 NOTE — Addendum Note (Signed)
Addended by: Miles Costain T on: 02/25/2019 03:41 PM   Modules accepted: Orders

## 2019-03-06 ENCOUNTER — Other Ambulatory Visit: Payer: Self-pay | Admitting: Adult Health

## 2019-03-06 NOTE — Telephone Encounter (Signed)
Copied from [place] [id number]. Topic: Quick Communication - Rx Refill/Question >> Mar 06, 2019  3:00 PM Ahmed Prima L wrote: Medication: meloxicam (MOBIC) 15 MG tablet  Has the patient contacted their pharmacy? Yes several times has been trying to get since the first of the week and she is out (Agent: If no, request that the patient contact the pharmacy for the refill.) (Agent: If yes, when and what did the pharmacy advise?)  Preferred Pharmacy (with phone number or street name): [facility] - [place], [place] - [address] [place] [address] Phone: [phone] Fax: [phone]    Agent: Please be advised that RX refills may take up to 3 business days. We ask that you follow-up with your pharmacy.

## 2019-03-07 MED ORDER — MELOXICAM 15 MG PO TABS: 15.0000 mg | ORAL_TABLET | Freq: Every day | ORAL | 1 refills | Status: DC

## 2019-03-07 NOTE — Telephone Encounter (Signed)
Rx done. 

## 2019-03-11 ENCOUNTER — Telehealth: Payer: Self-pay | Admitting: Adult Health

## 2019-03-11 ENCOUNTER — Other Ambulatory Visit: Payer: Self-pay | Admitting: Adult Health

## 2019-03-11 DIAGNOSIS — Z760 Encounter for issue of repeat prescription: Secondary | ICD-10-CM

## 2019-03-11 DIAGNOSIS — F419 Anxiety disorder, unspecified: Principal | ICD-10-CM

## 2019-03-11 DIAGNOSIS — F329 Major depressive disorder, single episode, unspecified: Secondary | ICD-10-CM

## 2019-03-11 DIAGNOSIS — F32A Depression, unspecified: Secondary | ICD-10-CM

## 2019-03-11 NOTE — Telephone Encounter (Signed)
Sent to the pharmacy by e-scribe. 

## 2019-03-11 NOTE — Telephone Encounter (Signed)
Copied from [place] [id number]. Topic: Quick Communication - Rx Refill/Question >> Mar 11, 2019 10:12 AM Rainey Pines A wrote: Medication: escitalopram (LEXAPRO) 20 MG tablet (Patient is completely out of medication)  Has the patient contacted their pharmacy? Yes (Agent: If no, request that the patient contact the pharmacy for the refill.) (Agent: If yes, when and what did the pharmacy advise?)Contact PCP  Preferred Pharmacy (with phone number or street name): [facility] - [place], [place] - [address] [phone] (Phone) [phone] (Fax)    Agent: Please be advised that RX refills may take up to 3 business days. We ask that you follow-up with your pharmacy.

## 2019-03-11 NOTE — Telephone Encounter (Signed)
This has been sent to the pharmacy.  Nothing further needed.

## 2019-03-21 ENCOUNTER — Other Ambulatory Visit: Payer: Self-pay

## 2019-03-21 ENCOUNTER — Ambulatory Visit: Payer: Self-pay | Admitting: Adult Health

## 2019-03-21 ENCOUNTER — Ambulatory Visit (INDEPENDENT_AMBULATORY_CARE_PROVIDER_SITE_OTHER): Payer: Medicare Other | Admitting: Adult Health

## 2019-03-21 ENCOUNTER — Encounter: Payer: Self-pay | Admitting: Adult Health

## 2019-03-21 VITALS — BP 146/62 | Temp 97.9°F | Wt 133.0 lb

## 2019-03-21 DIAGNOSIS — S40261A Insect bite (nonvenomous) of right shoulder, initial encounter: Secondary | ICD-10-CM

## 2019-03-21 DIAGNOSIS — W57XXXA Bitten or stung by nonvenomous insect and other nonvenomous arthropods, initial encounter: Secondary | ICD-10-CM

## 2019-03-21 MED ORDER — DOXYCYCLINE HYCLATE 100 MG PO CAPS: 100.0000 mg | ORAL_CAPSULE | Freq: Two times a day (BID) | ORAL | 0 refills | Status: DC

## 2019-03-21 NOTE — Telephone Encounter (Signed)
Pt. Noticed a skin tag on her right shoulder after her shower "that looks like a skin tag trying to come off." After speaking with pt., she looked at it again and it is a tick that is embedded and she can not remove. Lives alone and does not have help to remove it. Spoke with Vita Barley and will send triage note over.  Answer Assessment - Initial Assessment Questions 1. APPEARANCE of INJURY: "What does the injury look like?"      Skin tag to right shoulder 2. SIZE: "How large is the cut?"      1/4 inch 3. BLEEDING: "Is it bleeding now?" If so, ask: "Is it difficult to stop?"      It was oozing after her shower 4. LOCATION: "Where is the injury located?"      Right shoulder - skin tag "is trying to come off." 5. ONSET: "How long ago did the injury occur?"      This morning 6. MECHANISM: "Tell me how it happened."      After her shower 7. TETANUS: "When was the last tetanus booster?"     Unsure 8. PREGNANCY: "Is there any chance you are pregnant?" "When was your last menstrual period?"     No  Protocols used: SKIN INJURY-A-AH

## 2019-03-21 NOTE — Progress Notes (Signed)
Subjective:    Patient ID: Sheila Wolf, female    DOB: 25-May-1934, 85 y.o.   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  She presents to the office today for removal of tick.  She was in the shower this morning and noticed a tick on her right shoulder.  She is unsure how long the tick has been embedded.  Denies fevers, chills, muscle aches, rash.   Review of Systems See HPI   Past Medical History:  Diagnosis Date  . Anxiety and depression   . Arthritis   . Frequent headaches   . GERD (gastroesophageal reflux disease)   . Hypertension   . Osteoporosis   . 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 Urinary incontinence     Social History   Socioeconomic History  . Marital status: Widowed    Spouse name: Not on file  . Number of children: Not on file  . Years of education: Not on file  . Highest education level: Not on file  Occupational History  . Not on file  Social Needs  . Financial resource strain: Not on file  . Food insecurity:    Worry: Not on file    Inability: Not on file  . Transportation needs:    Medical: Not on file    Non-medical: Not on file  Tobacco Use  . Smoking status: Former Smoker    Years: 10.00    Types: Cigarettes  . Smokeless tobacco: Never Used  Substance and Sexual Activity  . Alcohol use: Yes    Alcohol/week: 10.0 standard drinks    Types: 10 Glasses of wine per week  . Drug use: No  . Sexual activity: Never  Lifestyle  . Physical activity:    Days per week: Not on file    Minutes per session: Not on file  . Stress: Not on file  Relationships  . Social connections:    Talks on phone: Not on file    Gets together: Not on file    Attends religious service: Not on file    Active member of club or organization: Not on file   Attends meetings of clubs or organizations: Not on file    Relationship status: Not on file  . Intimate partner violence:    Fear of current or ex partner: Not on file    Emotionally abused: Not on file    Physically abused: Not on file    Forced sexual activity: Not on file  Other Topics Concern  . Not on file  Social History Narrative  . Not on file    Past Surgical History:  Procedure Laterality Date  . APPENDECTOMY    . BUNIONECTOMY    . CERVICAL SPINE SURGERY    . SHOULDER SURGERY    . TONSILLECTOMY AND ADENOIDECTOMY      Family History  Problem Relation Age of Onset  . Arthritis Mother   . Hearing loss Mother   . Heart disease Mother   . Hypertension Mother   . Alcohol abuse Father   . Early death Father   . Heart disease Father   . Stroke Father   . Birth defects Brother   . Heart disease Brother     Allergies  Allergen Reactions  . Penicillins Rash    Current Outpatient Medications on File Prior to Visit  Medication Sig Dispense Refill  . amLODipine (NORVASC) 5 MG tablet Take 1 tablet (5 mg total) by mouth daily. 90 tablet 1  . betamethasone dipropionate (DIPROLENE) 0.05 % cream APPLY TO AFFECTED AREA UP TO 2 TIMES DAILY AS NEEDED. DO NOT APPLY TO FACE, GROIN, AND UNDERARM    . butalbital-acetaminophen-caffeine (FIORICET, ESGIC) 50-325-40 MG tablet Take 1 tablet by mouth every 6 (six) hours as needed for headache. Take 1 tablet four times daily as needed 30 tablet 0  . escitalopram (LEXAPRO) 20 MG tablet TAKE 1 TABLET BY MOUTH EVERY DAY 90 tablet 1  . fluticasone (FLONASE) 50 MCG/ACT nasal spray Place 2 sprays into both nostrils daily. 16 g 6  . meloxicam (MOBIC) 15 MG tablet Take 1 tablet (15 mg total) by mouth daily. 90 tablet 1  . metoprolol succinate (TOPROL-XL) 50 MG 24 hr tablet Take 1 tablet (50 mg total) by mouth daily. Take with or immediately following a meal. 90 tablet 3  . NON FORMULARY Juice plus supplement    . omeprazole (PRILOSEC) 20 MG  capsule Take 1 capsule (20 mg total) by mouth daily. 90 capsule 3  . Saccharomyces boulardii (PROBIOTIC) 250 MG CAPS Take 250 mg by mouth.    . cetirizine (ZYRTEC) 10 MG tablet Take 1 tablet (10 mg total) by mouth daily. 90 tablet 2   No current facility-administered medications on file prior to visit.     BP (!) 146/62   Temp 97.9 F (36.6 C)   Wt 133 lb (60.3 kg)   BMI 23.94 kg/m       Objective:   Physical Exam Vitals signs and nursing note reviewed.  Constitutional:      Appearance: Normal appearance.  Skin:    General: Skin is warm and dry.     Comments: Engorged Brown Dog tick noted on the posterior aspect of right shoulder  Neurological:     General: No focal deficit present.     Mental Status: She is alert and oriented to person, place, and time.  Psychiatric:        Mood and Affect: Mood normal.        Behavior: Behavior normal.        Thought Content: Thought content normal.        Judgment: Judgment normal.       Assessment & Plan:  1. Tick bite of right shoulder, initial encounter -Tick was able to be removed in its entirety.  To unknown duration of how long it is been attached we will treat with doxycycline twice daily x7 days to prevent tickborne illness as well as infection. - doxycycline (VIBRAMYCIN) 100 MG capsule; Take 1 capsule (100 mg total) by mouth 2 (two) times daily.  Dispense: 14 capsule; Refill: 0   Dorothyann Peng, NP

## 2019-03-21 NOTE — Telephone Encounter (Signed)
Patient scheduled to see Sheila Wolf at 2:30 PM

## 2019-04-10 ENCOUNTER — Other Ambulatory Visit: Payer: Self-pay | Admitting: Adult Health

## 2019-04-10 DIAGNOSIS — Z760 Encounter for issue of repeat prescription: Secondary | ICD-10-CM

## 2019-04-22 ENCOUNTER — Other Ambulatory Visit: Payer: Self-pay | Admitting: Adult Health

## 2019-04-22 NOTE — Telephone Encounter (Signed)
Copied from [place] [id number]. Topic: Quick Communication - Rx Refill/Question >> Apr 22, 2019 10:43 AM Rainey Pines A wrote: Medication: amLODipine (NORVASC) 5 MG tablet   Has the patient contacted their pharmacy? Yes (Agent: If no, request that the patient contact the pharmacy for the refill.) (Agent: If yes, when and what did the pharmacy advise?)Contact PCP  Preferred Pharmacy (with phone number or street name): [facility] - [place], [place] - [address] [phone] (Phone) [phone] (Fax)    Agent: Please be advised that RX refills may take up to 3 business days. We ask that you follow-up with your pharmacy.

## 2019-04-22 NOTE — Telephone Encounter (Signed)
Filled on 01/22/2019 for 6 months.  Request is too early.

## 2019-05-07 ENCOUNTER — Other Ambulatory Visit: Payer: Self-pay | Admitting: Adult Health

## 2019-05-07 DIAGNOSIS — Z760 Encounter for issue of repeat prescription: Secondary | ICD-10-CM

## 2019-05-09 ENCOUNTER — Ambulatory Visit: Payer: Medicare Other | Admitting: Adult Health

## 2019-05-09 ENCOUNTER — Telehealth: Payer: Self-pay | Admitting: *Deleted

## 2019-05-09 NOTE — Telephone Encounter (Signed)
Pt is asking for call back please call

## 2019-05-09 NOTE — Telephone Encounter (Signed)
Copied from [place] [phone]. Topic: General - Other >> May 09, 2019  2:47 PM Keene Breath wrote: Reason for CRM: Patient is returning a call from [facility].  Tried the office but did not get an answer.  Please call patient back at [phone]

## 2019-05-13 NOTE — Telephone Encounter (Signed)
I have not called the pt.  Cannot tell by the chart who has contacted the pt.

## 2019-05-16 ENCOUNTER — Ambulatory Visit (INDEPENDENT_AMBULATORY_CARE_PROVIDER_SITE_OTHER): Payer: Medicare Other | Admitting: Family Medicine

## 2019-05-16 ENCOUNTER — Ambulatory Visit: Payer: Self-pay

## 2019-05-16 ENCOUNTER — Other Ambulatory Visit: Payer: Self-pay

## 2019-05-16 ENCOUNTER — Encounter: Payer: Self-pay | Admitting: Family Medicine

## 2019-05-16 DIAGNOSIS — J069 Acute upper respiratory infection, unspecified: Secondary | ICD-10-CM

## 2019-05-16 NOTE — Telephone Encounter (Signed)
Pt. called to report onset of diarrhea, fatigue, cough, headache, and runny nose.  Denied nausea or vomiting.  Denied shortness of breath or chest tightness.  Denied fever; temp 97.5 this AM.  Reported 3 diarrhea stools yesterday and 2 diarrhea stools today. Has not taken any OTC antidiarrheal medications.    Transferred pt. To the office to have Virtual visit scheduled.  Pt. Agreed with plan.      Reason for Disposition . [1] COVID-19 infection suspected by caller or triager AND [2] mild symptoms (cough, fever, or others) AND [8] no complications or SOB  Answer Assessment - Initial Assessment Questions 1. COVID-19 DIAGNOSIS: "Who made your Coronavirus (COVID-19) diagnosis?" "Was it confirmed by a positive lab test?" If not diagnosed by a HCP, ask "Are there lots of cases (community spread) where you live?" (See public health department website, if unsure)     Present in Community 2. ONSET: "When did the COVID-19 symptoms start?"     Yesterday  3. WORST SYMPTOM: "What is your worst symptom?" (e.g., cough, fever, shortness of breath, muscle aches)     Diarrhea with loose yellow brown stool (3 yesterday, and 2 today) 4. COUGH: "Do you have a cough?" If so, ask: "How bad is the cough?"       hacky cough x 2 days 5. FEVER: "Do you have a fever?" If so, ask: "What is your temperature, how was it measured, and when did it start?"     Temp. 97.5 6. RESPIRATORY STATUS: "Describe your breathing?" (e.g., shortness of breath, wheezing, unable to speak)      "a little shortness of breath with laying down" 7. BETTER-SAME-WORSE: "Are you getting better, staying the same or getting worse compared to yesterday?"  If getting worse, ask, "In what way?"     A little worse today 8. HIGH RISK DISEASE: "Do you have any chronic medical problems?" (e.g., asthma, heart or lung disease, weak immune system, etc.)    Atrial Fib, otherwise healthy 9. PREGNANCY: "Is there any chance you are pregnant?" "When was your last  menstrual period?"    n/a 10. OTHER SYMPTOMS: "Do you have any other symptoms?"  (e.g., chills, fatigue, headache, loss of smell or taste, muscle pain, sore throat)       Headache, fatigue, runny nose, diarrhea stools x 2 days, dry hacky cough  Protocols used: CORONAVIRUS (COVID-19) DIAGNOSED OR SUSPECTED-A-AH

## 2019-05-16 NOTE — Progress Notes (Signed)
Subjective:    Patient ID: Sheila Wolf, female    DOB: [birth year], 85 y.o.   MRN: [id number]  HPI Virtual Visit via Video Note  I connected with the patient on 05/16/19 at  2:00 PM EDT by a video enabled telemedicine application and verified that I am speaking with the correct person using two identifiers.  Location patient: home Location provider:work or home office Persons participating in the virtual visit: patient, provider  I discussed the limitations of evaluation and management by telemedicine and the availability of in person appointments. The patient expressed understanding and agreed to proceed.   HPI: Here with questions about symptoms she developed 2 days ago. She has had some mild fatigue, diarrhea, headache, and a dry cough. No fever or chest pain or SOB. No body aches or loss of taste or smell. She has been drinking fluids. Today she feels much better and is almost back to normal. She has not been around anyone the past few weeks except her children and grandchildren, and none of them have symptoms.   ROS: See pertinent positives and negatives per HPI.  Past Medical History:  Diagnosis Date  . Anxiety and depression   . Arthritis   . Frequent headaches   . GERD (gastroesophageal reflux disease)   . Hypertension   . Osteoporosis   . 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 Urinary incontinence     Past Surgical History:  Procedure Laterality Date  . APPENDECTOMY    . BUNIONECTOMY    . CERVICAL SPINE SURGERY    . SHOULDER SURGERY    . TONSILLECTOMY AND ADENOIDECTOMY      Family History  Problem Relation Age of Onset  . Arthritis Mother   . Hearing loss Mother   . Heart disease Mother   . Hypertension Mother   . Alcohol abuse Father   . Early death Father   . Heart disease Father   . Stroke Father   . Birth defects Brother   . Heart disease Brother      Current Outpatient Medications:  .  amLODipine (NORVASC) 5 MG tablet, Take 1 tablet (5 mg  total) by mouth daily., Disp: 90 tablet, Rfl: 1 .  betamethasone dipropionate (DIPROLENE) 0.05 % cream, APPLY TO AFFECTED AREA UP TO 2 TIMES DAILY AS NEEDED. DO NOT APPLY TO FACE, GROIN, AND UNDERARM, Disp: , Rfl:  .  butalbital-acetaminophen-caffeine (FIORICET) 50-325-40 MG tablet, TAKE 1 TABLET BY MOUTH EVERY 6 HOURS AS NEEDED FOR HEADACHE, Disp: 30 tablet, Rfl: 2 .  escitalopram (LEXAPRO) 20 MG tablet, TAKE 1 TABLET BY MOUTH EVERY DAY, Disp: 90 tablet, Rfl: 1 .  fluticasone (FLONASE) 50 MCG/ACT nasal spray, Place 2 sprays into both nostrils daily., Disp: 16 g, Rfl: 6 .  meloxicam (MOBIC) 15 MG tablet, TAKE 1 TABLET BY MOUTH EVERY DAY, Disp: 90 tablet, Rfl: 1 .  metoprolol succinate (TOPROL-XL) 50 MG 24 hr tablet, Take 1 tablet (50 mg total) by mouth daily. Take with or immediately following a meal., Disp: 90 tablet, Rfl: 3 .  NON FORMULARY, Juice plus supplement, Disp: , Rfl:  .  omeprazole (PRILOSEC) 20 MG capsule, Take 1 capsule (20 mg total) by mouth daily., Disp: 90 capsule, Rfl: 3 .  Saccharomyces boulardii (PROBIOTIC) 250 MG CAPS, Take 250 mg by mouth., Disp: , Rfl:  .  cetirizine (ZYRTEC) 10 MG tablet, Take 1 tablet (10 mg total) by mouth daily., Disp: 90 tablet, Rfl: 2  EXAM:  VITALS per patient if applicable:  GENERAL: alert, oriented, appears well and in no acute distress  HEENT: atraumatic, conjunttiva clear, no obvious abnormalities on inspection of external nose and ears  NECK: normal movements of the head and neck  LUNGS: on inspection no signs of respiratory distress, breathing rate appears normal, no obvious gross SOB, gasping or wheezing  CV: no obvious cyanosis  MS: moves all visible extremities without noticeable abnormality  PSYCH/NEURO: pleasant and cooperative, no obvious depression or anxiety, speech and thought processing grossly intact  ASSESSMENT AND PLAN: She seems to be recovering from a mild viral URI. This is not likely to be due to the Covid-19 virus,  and I do not think she needs to be tested for it. Recheck prn.  Alysia Penna, MD  Discussed the following assessment and plan:  No diagnosis found.     I discussed the assessment and treatment plan with the patient. The patient was provided an opportunity to ask questions and all were answered. The patient agreed with the plan and demonstrated an understanding of the instructions.   The patient was advised to call back or seek an in-person evaluation if the symptoms worsen or if the condition fails to improve as anticipated.     Review of Systems     Objective:   Physical Exam        Assessment & Plan:

## 2019-05-19 ENCOUNTER — Other Ambulatory Visit: Payer: Self-pay | Admitting: Adult Health

## 2019-05-19 DIAGNOSIS — Z760 Encounter for issue of repeat prescription: Secondary | ICD-10-CM

## 2019-05-20 NOTE — Telephone Encounter (Signed)
DENIED.  PT SHOULD CONTACT THE PHARMACY.  MESSAGE SENT TO THE PHARMACY.

## 2019-05-30 ENCOUNTER — Other Ambulatory Visit: Payer: Self-pay | Admitting: Adult Health

## 2019-05-30 DIAGNOSIS — Z760 Encounter for issue of repeat prescription: Secondary | ICD-10-CM

## 2019-05-30 NOTE — Telephone Encounter (Signed)
DENIED.  MESSAGE SENT TO THE PHARMACY TO HAVE PT CONTACT PROVIDER FOR FURTHER REFILLS.

## 2019-05-30 NOTE — Telephone Encounter (Signed)
This was sent in on 04/10/2019 for 30 tabs plus 2 refills. Does she need more already?

## 2019-06-13 ENCOUNTER — Other Ambulatory Visit: Payer: Self-pay | Admitting: Adult Health

## 2019-06-13 DIAGNOSIS — Z760 Encounter for issue of repeat prescription: Secondary | ICD-10-CM

## 2019-06-13 NOTE — Telephone Encounter (Signed)
Relation to pt: self  Call back number: [phone]  Pharmacy: [facility] [place], [place] - [address] [phone] (Phone) [phone] (Fax)     Reason for call:  Patient requesting butalbital-acetaminophen-caffeine Emelda Brothers) 50-325-40 MG tablet refill today, chart reflects pharmacy sent in request on 05/30/2019, please advise

## 2019-06-26 ENCOUNTER — Ambulatory Visit: Payer: Self-pay | Admitting: *Deleted

## 2019-06-26 NOTE — Telephone Encounter (Signed)
Patient is requesting an in office appt due to how bad her elbow is from the fall.  Call back number is [phone] .

## 2019-06-26 NOTE — Telephone Encounter (Signed)
Patient fell Monday and injured right elbow- scraped. Patient hurt right foot- blue and swollen. Call to office for appointment.  Reason for Disposition . [1] Limp when walking AND [2] due to a twisted ankle or foot  Answer Assessment - Initial Assessment Questions 1. MECHANISM: "How did the injury happen?" (e.g., twisting injury, direct blow)      Patient tripped getting up from nap- foot fell asleep. 2. ONSET: "When did the injury happen?" (Minutes or hours ago)      Monday 3. LOCATION: "Where is the injury located?"      Right foot- side of foot is bruised and swelling is present 4. APPEARANCE of INJURY: "What does the injury look like?"      Bruising on outside of foot- swelling is present at the ankle 5. WEIGHT-BEARING: "Can you put weight on that foot?" "Can you walk (four steps or more)?"       Yes- patient is "hobblig" but she can walk on it 6. SIZE: For cuts, bruises, or swelling, ask: "How large is it?" (e.g., inches or centimeters;  entire joint)      Scraped right elbow, bruise toward the back of foot and into the ankle area  7. PAIN: "Is there pain?" If so, ask: "How bad is the pain?"    (e.g., Scale 1-10; or mild, moderate, severe)     Yes- 7- moderate 8. TETANUS: For any breaks in the skin, ask: "When was the last tetanus booster?"     Will have to check outside records for date of Tdap/tetanus 9. OTHER SYMPTOMS: "Do you have any other symptoms?"      Scraped elbow 10. PREGNANCY: "Is there any chance you are pregnant?" "When was your last menstrual period?"       n/a  Protocols used: FOOT AND ANKLE INJURY-A-AH

## 2019-06-26 NOTE — Telephone Encounter (Signed)
Pt now scheduled to see [facility].  Will forward as FYI.

## 2019-06-27 ENCOUNTER — Encounter: Payer: Self-pay | Admitting: Adult Health

## 2019-06-27 ENCOUNTER — Ambulatory Visit (INDEPENDENT_AMBULATORY_CARE_PROVIDER_SITE_OTHER): Payer: Medicare Other | Admitting: Adult Health

## 2019-06-27 ENCOUNTER — Other Ambulatory Visit: Payer: Self-pay | Admitting: Adult Health

## 2019-06-27 ENCOUNTER — Telehealth: Payer: Self-pay | Admitting: Adult Health

## 2019-06-27 ENCOUNTER — Other Ambulatory Visit: Payer: Self-pay

## 2019-06-27 VITALS — BP 186/84 | Temp 98.3°F | Wt 128.0 lb

## 2019-06-27 DIAGNOSIS — I10 Essential (primary) hypertension: Secondary | ICD-10-CM | POA: Diagnosis not present

## 2019-06-27 DIAGNOSIS — M79671 Pain in right foot: Secondary | ICD-10-CM | POA: Diagnosis not present

## 2019-06-27 DIAGNOSIS — M25521 Pain in right elbow: Secondary | ICD-10-CM | POA: Diagnosis not present

## 2019-06-27 DIAGNOSIS — Z760 Encounter for issue of repeat prescription: Secondary | ICD-10-CM

## 2019-06-27 MED ORDER — AMLODIPINE BESYLATE 5 MG PO TABS: 10.0000 mg | ORAL_TABLET | Freq: Every day | ORAL | 0 refills | Status: DC

## 2019-06-27 NOTE — Telephone Encounter (Signed)
Spoke to the pt.  She stated she was confused about how she is to be taking her medication.  Reviewed Cory's note.  Advised that he increased her amlodipine to 10 mg daily (2 tabs).  She said he told her to take something at night and states that she been taking the amlodipine at night and she takes the metoprolol during the day.  She then brought up Remeron and asked if she needed to take that at night.  Advised that she is no longer taking that.  She thought it was for BP but informed her that is was given to her in the past to help her rest.  Told her to throw the bottle away so it doesn't confuse her any longer.  Then instructed her to keep doing as she has been (so she doesn't become more confused)  but to ONLY change the amlodipine to 2  tablets.  She has not had any amlodipine today so instructed her to take 2 tabs tonight.  Pt still seemed to be very confused when call ended.  Seemed to want to ask a question but unsure how.  Advised she could call back at any time should she think of something else to ask.  Will forward to [facility] as [place].

## 2019-06-27 NOTE — Patient Instructions (Signed)
Your blood pressure is elevated and that is why you are having so many headaches.   I am going to increased Norvasc from 5 mg to 10 mg ( take two pills of what you have at home); I will send in a new prescription of 10 mg tabs  Follow up in two weeks

## 2019-06-27 NOTE — Progress Notes (Signed)
Subjective:    Patient ID: Sheila Wolf, female    DOB: Aug 30, 1934, 85 y.o.   MRN: [id number]  HPI   85 year old female who is being evaluated today for an acute issue of right foot pain and right elbow pain as well as increase in chronic headache.  She reports earlier in the week she was sitting down watching TV when she got up out of the chair her foot was asleep and she had a mechanical fall into a wall.  Reports injuring her right elbow and right foot.  Her right elbow has a large abrasion and some mild discomfort.  Her right foot has mild discomfort that is described as "sore" and some bruising along the top and outside of the right foot.  She denies hitting her head or losing consciousness  Additionally she would like a refill of Fioricet, she reports increased headaches that seem to be lasting most of the day she states "and having to take the Fioricet like candy".  She does not monitor her blood pressure at home and reports taking her medications as directed.  On two separate blood pressure checks today her blood pressure is 186/84   Review of Systems See HPI   Past Medical History:  Diagnosis Date  . Anxiety and depression   . Arthritis   . Frequent headaches   . GERD (gastroesophageal reflux disease)   . Hypertension   . Osteoporosis   . 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 Urinary incontinence     Social History   Socioeconomic History  . Marital status: Widowed    Spouse name: Not on file  . Number of children: Not on file  . Years of education: Not on file  . Highest education level: Not on file  Occupational History  . Not on file  Social Needs  . Financial resource strain: Not on file  . Food insecurity    Worry: Not on file    Inability: Not on file  . Transportation needs    Medical: Not on file    Non-medical: Not on file  Tobacco Use  . Smoking status: Former Smoker    Years: 10.00    Types: Cigarettes  . Smokeless tobacco: Never Used   Substance and Sexual Activity  . Alcohol use: Yes    Alcohol/week: 10.0 standard drinks    Types: 10 Glasses of wine per week  . Drug use: No  . Sexual activity: Never  Lifestyle  . Physical activity    Days per week: Not on file    Minutes per session: Not on file  . Stress: Not on file  Relationships  . Social Herbalist on phone: Not on file    Gets together: Not on file    Attends religious service: Not on file    Active member of club or organization: Not on file    Attends meetings of clubs or organizations: Not on file    Relationship status: Not on file  . Intimate partner violence    Fear of current or ex partner: Not on file    Emotionally abused: Not on file    Physically abused: Not on file    Forced sexual activity: Not on file  Other Topics Concern  . Not on file  Social History Narrative  . Not on file    Past Surgical History:  Procedure Laterality Date  . APPENDECTOMY    . BUNIONECTOMY    . CERVICAL SPINE SURGERY    .  SHOULDER SURGERY    . TONSILLECTOMY AND ADENOIDECTOMY      Family History  Problem Relation Age of Onset  . Arthritis Mother   . Hearing loss Mother   . Heart disease Mother   . Hypertension Mother   . Alcohol abuse Father   . Early death Father   . Heart disease Father   . Stroke Father   . Birth defects Brother   . Heart disease Brother     Allergies  Allergen Reactions  . Penicillins Rash    Current Outpatient Medications on File Prior to Visit  Medication Sig Dispense Refill  . betamethasone dipropionate (DIPROLENE) 0.05 % cream APPLY TO AFFECTED AREA UP TO 2 TIMES DAILY AS NEEDED. DO NOT APPLY TO FACE, GROIN, AND UNDERARM    . butalbital-acetaminophen-caffeine (FIORICET) 50-325-40 MG tablet TAKE 1 TABLET BY MOUTH 4 TIMES DAILY AS NEEDED FOR FOR HEADACHE 30 tablet 1  . escitalopram (LEXAPRO) 20 MG tablet TAKE 1 TABLET BY MOUTH EVERY DAY 90 tablet 1  . fluticasone (FLONASE) 50 MCG/ACT nasal spray Place 2  sprays into both nostrils daily. 16 g 6  . meloxicam (MOBIC) 15 MG tablet TAKE 1 TABLET BY MOUTH EVERY DAY 90 tablet 1  . metoprolol succinate (TOPROL-XL) 50 MG 24 hr tablet Take 1 tablet (50 mg total) by mouth daily. Take with or immediately following a meal. 90 tablet 3  . NON FORMULARY Juice plus supplement    . omeprazole (PRILOSEC) 20 MG capsule Take 1 capsule (20 mg total) by mouth daily. 90 capsule 3  . Saccharomyces boulardii (PROBIOTIC) 250 MG CAPS Take 250 mg by mouth.    . cetirizine (ZYRTEC) 10 MG tablet Take 1 tablet (10 mg total) by mouth daily. 90 tablet 2   No current facility-administered medications on file prior to visit.     BP (!) 186/84   Temp 98.3 F (36.8 C)   Wt 128 lb (58.1 kg)   BMI 23.04 kg/m       Objective:   Physical Exam Vitals signs and nursing note reviewed.  Constitutional:      Appearance: Normal appearance.  Cardiovascular:     Rate and Rhythm: Normal rate and regular rhythm.     Pulses: Normal pulses.     Heart sounds: Normal heart sounds.  Pulmonary:     Effort: Pulmonary effort is normal.     Breath sounds: Normal breath sounds.  Musculoskeletal: Normal range of motion.        General: Swelling (Mild swelling across the dorsal aspect of right foot) present. No tenderness or deformity.     Right lower leg: No edema.     Left lower leg: No edema.  Skin:    General: Skin is warm and dry.     Capillary Refill: Capillary refill takes less than 2 seconds.     Findings: Bruising present.     Comments: She has mild bruising along the lateral aspect of the right foot as well as on the dorsal aspect.  Has a superficial skin abrasion on right elbow and no signs of infection noted  Neurological:     General: No focal deficit present.     Mental Status: She is alert. Mental status is at baseline.  Psychiatric:        Mood and Affect: Mood normal.        Behavior: Behavior normal.        Thought Content: Thought content normal.  Judgment: Judgment normal.       Assessment & Plan:  She has full range of motion of all extremities.  There was no pain with palpation to the right foot or right elbow.  No need for x-rays at this time.  Advise conservative measures.  Increase in headaches likely due to elevated blood pressure readings.  We will have her increase Norvasc from 5 mg to 10 mg.  She was advised to monitor blood pressure at home and return in 2 weeks or sooner if needed.  She develops dizziness lightheadedness then she can stop taking the increased dose and go back to the 5 mg tabs but she has to inform me  Dorothyann Peng, NP

## 2019-06-27 NOTE — Telephone Encounter (Signed)
Copied from [place] [id number]. Topic: General - Other >> Jun 27, 2019  1:16 PM Burchel, Abbi R wrote: Reason for CRM: Pt is a bit confused about her medication dosages/schedule following her appt today.  Pt is requesting a call from Cory's nurse/assistant.  Please call pt: [phone]

## 2019-07-08 ENCOUNTER — Other Ambulatory Visit: Payer: Self-pay | Admitting: Adult Health

## 2019-07-08 DIAGNOSIS — Z760 Encounter for issue of repeat prescription: Secondary | ICD-10-CM

## 2019-07-08 NOTE — Telephone Encounter (Signed)
Please advise, states this refill can not be delegated

## 2019-07-08 NOTE — Telephone Encounter (Signed)
Have her come in for an office visit and bring all of her medications with her

## 2019-07-08 NOTE — Telephone Encounter (Signed)
Medication: butalbital-acetaminophen-caffeine (FIORICET) 50-325-40 MG tablet     Patient is requesting refill.    Pharmacy:  [facility] - [place], [place] - [address] [phone] (Phone) [phone] (Fax)

## 2019-07-09 NOTE — Telephone Encounter (Signed)
Spoke to the pt.  She has an upcoming appt and I instructed her to bring all medications.  Those she takes everyday and those she uses PRN.

## 2019-07-11 ENCOUNTER — Ambulatory Visit (INDEPENDENT_AMBULATORY_CARE_PROVIDER_SITE_OTHER): Payer: Medicare Other | Admitting: Adult Health

## 2019-07-11 ENCOUNTER — Encounter: Payer: Self-pay | Admitting: Adult Health

## 2019-07-11 ENCOUNTER — Other Ambulatory Visit: Payer: Self-pay

## 2019-07-11 VITALS — BP 120/64 | Temp 98.1°F | Wt 126.0 lb

## 2019-07-11 DIAGNOSIS — R51 Headache: Secondary | ICD-10-CM

## 2019-07-11 DIAGNOSIS — I10 Essential (primary) hypertension: Secondary | ICD-10-CM | POA: Diagnosis not present

## 2019-07-11 DIAGNOSIS — R519 Headache, unspecified: Secondary | ICD-10-CM

## 2019-07-11 MED ORDER — BUTALBITAL-APAP-CAFFEINE 50-325-40 MG PO TABS: ORAL_TABLET | ORAL | 0 refills | Status: DC

## 2019-07-11 NOTE — Patient Instructions (Addendum)
Your blood pressure is great and I am glad you are having less headaches.   Try using a new pillow to see if the headaches you are getting in the morning go away   Throw away the prescription for Remeron and Flexeril   I will send in a prescription for Fiorcet for you to keep on hand but this will be the last prescription for awhile.

## 2019-07-11 NOTE — Progress Notes (Signed)
Subjective:    Patient ID: Sheila Wolf, female    DOB: Jul 09, 1934, 85 y.o.   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  She presents to the office today for two week follow up. During the last visit it was noticed that she was asking frequently for refill of Fioricet, when this was brought up she was reporting having increased headaches and stated " I have had to take Fiorecet like candy". She reported that she was not monitoring her blood pressure at home. In the office her blood pressure was 186/84 on two separate occassions.   Norvasc was increased from 5 mg to 10 mg and she was advised to monitor her blood pressure at home and return in two weeks.   Today she reports that she is less frequent headaches but she is waking up in the morning with headaches. Her pillow is old.   She denies lightheadedness, dizziness, or blurred vision since increasing Norvasc  Review of Systems See HPI   Past Medical History:  Diagnosis Date  . Anxiety and depression   . Arthritis   . Frequent headaches   . GERD (gastroesophageal reflux disease)   . Hypertension   . Osteoporosis   . 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 Urinary incontinence     Social History   Socioeconomic History  . Marital status: Widowed    Spouse name: Not on file  . Number of children: Not on file  . Years of education: Not on file  . Highest education level: Not on file  Occupational History  . Not on file  Social Needs  . Financial resource strain: Not on file  . Food insecurity    Worry: Not on file    Inability: Not on file  . Transportation needs    Medical: Not on file    Non-medical: Not on file  Tobacco Use  . Smoking status: Former Smoker    Years: 10.00    Types: Cigarettes  . Smokeless  tobacco: Never Used  Substance and Sexual Activity  . Alcohol use: Yes    Alcohol/week: 10.0 standard drinks    Types: 10 Glasses of wine per week  . Drug use: No  . Sexual activity: Never  Lifestyle  . Physical activity    Days per week: Not on file    Minutes per session: Not on file  . Stress: Not on file  Relationships  . Social Herbalist on phone: Not on file    Gets together: Not on file    Attends religious service: Not on file    Active member of club or organization: Not on file    Attends meetings of clubs or organizations: Not on file    Relationship status: Not on file  . Intimate partner violence    Fear of current or ex partner: Not on file    Emotionally abused: Not on file    Physically abused: Not on file    Forced sexual activity: Not on file  Other Topics Concern  . Not on file  Social History Narrative  . Not on file    Past Surgical History:  Procedure Laterality Date  . APPENDECTOMY    . BUNIONECTOMY    . CERVICAL SPINE SURGERY    . SHOULDER SURGERY    . TONSILLECTOMY AND  ADENOIDECTOMY      Family History  Problem Relation Age of Onset  . Arthritis Mother   . Hearing loss Mother   . Heart disease Mother   . Hypertension Mother   . Alcohol abuse Father   . Early death Father   . Heart disease Father   . Stroke Father   . Birth defects Brother   . Heart disease Brother     Allergies  Allergen Reactions  . Penicillins Rash    Current Outpatient Medications on File Prior to Visit  Medication Sig Dispense Refill  . amLODipine (NORVASC) 5 MG tablet Take 2 tablets (10 mg total) by mouth daily. 90 tablet 0  . Apoaequorin (PREVAGEN) 10 MG CAPS Take 1 capsule by mouth daily.    . betamethasone dipropionate (DIPROLENE) 0.05 % cream APPLY TO AFFECTED AREA UP TO 2 TIMES DAILY AS NEEDED. DO NOT APPLY TO FACE, GROIN, AND UNDERARM    . cycloSPORINE (RESTASIS) 0.05 % ophthalmic emulsion 1 drop 2 (two) times daily.    Marland Kitchen escitalopram  (LEXAPRO) 20 MG tablet TAKE 1 TABLET BY MOUTH EVERY DAY 90 tablet 1  . fluticasone (FLONASE) 50 MCG/ACT nasal spray Place 2 sprays into both nostrils daily. 16 g 6  . Melatonin 5 MG CAPS Take by mouth.    . meloxicam (MOBIC) 15 MG tablet TAKE 1 TABLET BY MOUTH EVERY DAY 90 tablet 1  . metoprolol succinate (TOPROL-XL) 50 MG 24 hr tablet Take 1 tablet (50 mg total) by mouth daily. Take with or immediately following a meal. 90 tablet 3  . NON FORMULARY Juice plus supplement    . omeprazole (PRILOSEC) 20 MG capsule Take 1 capsule (20 mg total) by mouth daily. 90 capsule 3  . Probiotic Product (PROBIOTIC PO) Take by mouth. 7 strand    . cetirizine (ZYRTEC) 10 MG tablet Take 1 tablet (10 mg total) by mouth daily. 90 tablet 2   No current facility-administered medications on file prior to visit.     BP 120/64   Temp 98.1 F (36.7 C)   Wt 126 lb (57.2 kg)   BMI 22.68 kg/m       Objective:   Physical Exam Vitals signs and nursing note reviewed.  Constitutional:      Appearance: Normal appearance.  Cardiovascular:     Rate and Rhythm: Normal rate and regular rhythm.     Pulses: Normal pulses.     Heart sounds: Normal heart sounds.  Pulmonary:     Effort: Pulmonary effort is normal.     Breath sounds: Normal breath sounds.  Neurological:     General: No focal deficit present.     Mental Status: She is alert and oriented to person, place, and time.  Psychiatric:        Mood and Affect: Mood normal.        Behavior: Behavior normal.        Thought Content: Thought content normal.        Judgment: Judgment normal.        Assessment & Plan:  1. Essential hypertension - Better control and headaches have improved.  - Continue with current dose   2. Frequent headaches - Advised to try a new pillow to see if this resolves her morning headache.  - Follow up as needed - butalbital-acetaminophen-caffeine (FIORICET) 50-325-40 MG tablet; TAKE 1 TABLET BY MOUTH 4 TIMES DAILY AS NEEDED  FOR FOR HEADACHE  Dispense: 30 tablet; Refill: 0   Dorothyann Peng, NP

## 2019-07-30 ENCOUNTER — Other Ambulatory Visit: Payer: Self-pay | Admitting: Adult Health

## 2019-07-30 DIAGNOSIS — R519 Headache, unspecified: Secondary | ICD-10-CM

## 2019-07-30 DIAGNOSIS — I10 Essential (primary) hypertension: Secondary | ICD-10-CM

## 2019-08-01 NOTE — Telephone Encounter (Signed)
Ok for zyrtec and Toprol 90 x 3  I am not going to fill her Fioricet at this time

## 2019-08-04 ENCOUNTER — Other Ambulatory Visit: Payer: Self-pay | Admitting: Adult Health

## 2019-08-04 DIAGNOSIS — R519 Headache, unspecified: Secondary | ICD-10-CM

## 2019-08-11 ENCOUNTER — Other Ambulatory Visit: Payer: Self-pay | Admitting: Adult Health

## 2019-08-11 DIAGNOSIS — F329 Major depressive disorder, single episode, unspecified: Secondary | ICD-10-CM

## 2019-08-11 DIAGNOSIS — Z760 Encounter for issue of repeat prescription: Secondary | ICD-10-CM

## 2019-08-11 DIAGNOSIS — F32A Depression, unspecified: Secondary | ICD-10-CM

## 2019-08-11 DIAGNOSIS — F419 Anxiety disorder, unspecified: Secondary | ICD-10-CM

## 2019-08-13 NOTE — Telephone Encounter (Signed)
Last CPX in 09/2018. Ok to refill for 90 days

## 2019-08-13 NOTE — Telephone Encounter (Signed)
Sent to the pharmacy for 90 days as instructed.

## 2019-08-23 DIAGNOSIS — Z23 Encounter for immunization: Secondary | ICD-10-CM | POA: Diagnosis not present

## 2019-08-26 ENCOUNTER — Other Ambulatory Visit: Payer: Self-pay | Admitting: Adult Health

## 2019-08-26 DIAGNOSIS — I10 Essential (primary) hypertension: Secondary | ICD-10-CM

## 2019-08-26 MED ORDER — AMLODIPINE BESYLATE 5 MG PO TABS: 10.0000 mg | ORAL_TABLET | Freq: Every day | ORAL | 0 refills | Status: DC

## 2019-08-26 NOTE — Telephone Encounter (Signed)
Medication Refill - Medication:  amLODipine (NORVASC) 5 MG tablet   Has the patient contacted their pharmacy? Yes advised to call office.   Preferred Pharmacy (with phone number or street name):  [facility] - [place], [place] - [address] [phone] (Phone) [phone] (Fax)   Agent: Please be advised that RX refills may take up to 3 business days. We ask that you follow-up with your pharmacy.

## 2019-09-04 ENCOUNTER — Other Ambulatory Visit: Payer: Self-pay | Admitting: Adult Health

## 2019-09-04 DIAGNOSIS — I10 Essential (primary) hypertension: Secondary | ICD-10-CM

## 2019-10-02 ENCOUNTER — Other Ambulatory Visit: Payer: Self-pay | Admitting: Adult Health

## 2019-10-02 DIAGNOSIS — R519 Headache, unspecified: Secondary | ICD-10-CM

## 2019-10-02 NOTE — Telephone Encounter (Signed)
Medication Refill - Medication: butalbital-acetaminophen-caffeine (FIORICET) 50-325-40 MG tablet   Pt stated her brother passed away a week ago and she is having a hard time. She would like to request for a refill of Fioricet to have on hand and sent to pharmacy before the weekend.  Has the patient contacted their pharmacy? No. (Agent: If no, request that the patient contact the pharmacy for the refill.) (Agent: If yes, when and what did the pharmacy advise?) Needs to be requested by pt  Preferred Pharmacy (with phone number or street name): [facility] - [place], [place] - [address] [phone] (Phone) [phone] (Fax)     Agent: Please be advised that RX refills may take up to 3 business days. We ask that you follow-up with your pharmacy.

## 2019-10-02 NOTE — Telephone Encounter (Signed)
Requested medication (s) are due for refill today: yes  Requested medication (s) are on the active medication list: yes  Last refill:  07/11/2019  Future visit scheduled: yes  Notes to clinic: Pt stated her brother passed away a week ago and she is having a hard time. She would like to request for a refill of Fioricet to have on hand and sent to pharmacy before the weekend.    Requested Prescriptions  Pending Prescriptions Disp Refills   butalbital-acetaminophen-caffeine (FIORICET) 50-325-40 MG tablet 30 tablet 0    Sig: TAKE 1 TABLET BY MOUTH 4 TIMES DAILY AS NEEDED FOR FOR HEADACHE     Not Delegated - Analgesics:  Non-Opioid Analgesic Combinations Failed - 10/02/2019  1:00 PM      Failed - This refill cannot be delegated      Passed - Valid encounter within last 12 months    Recent Outpatient Visits          2 months ago Essential hypertension   Therapist, music at [facility], [place], NP   3 months ago Right elbow pain   Therapist, music at [facility], [place], NP   4 months ago [facility] [place], MD   6 months ago Tick bite of right shoulder, initial Education administrator at [facility], [place], NP   8 months ago Pruritus   Therapist, music at [facility], [place], NP      Future Appointments            In 2 weeks Nafziger, [name], NP [employer] at [place], [facility]

## 2019-10-03 MED ORDER — BUTALBITAL-APAP-CAFFEINE 50-325-40 MG PO TABS: ORAL_TABLET | ORAL | 0 refills | Status: DC

## 2019-10-06 ENCOUNTER — Other Ambulatory Visit: Payer: Self-pay | Admitting: Adult Health

## 2019-10-06 DIAGNOSIS — I10 Essential (primary) hypertension: Secondary | ICD-10-CM

## 2019-10-08 NOTE — Telephone Encounter (Signed)
Sent to the pharmacy by e-scribe. 

## 2019-10-15 ENCOUNTER — Other Ambulatory Visit: Payer: Self-pay

## 2019-10-22 ENCOUNTER — Ambulatory Visit (INDEPENDENT_AMBULATORY_CARE_PROVIDER_SITE_OTHER): Payer: Medicare Other | Admitting: Adult Health

## 2019-10-22 ENCOUNTER — Encounter: Payer: Self-pay | Admitting: Adult Health

## 2019-10-22 ENCOUNTER — Other Ambulatory Visit: Payer: Self-pay

## 2019-10-22 VITALS — BP 120/60 | Temp 97.9°F | Wt 123.0 lb

## 2019-10-22 DIAGNOSIS — F329 Major depressive disorder, single episode, unspecified: Secondary | ICD-10-CM | POA: Diagnosis not present

## 2019-10-22 DIAGNOSIS — K219 Gastro-esophageal reflux disease without esophagitis: Secondary | ICD-10-CM | POA: Diagnosis not present

## 2019-10-22 DIAGNOSIS — F419 Anxiety disorder, unspecified: Secondary | ICD-10-CM

## 2019-10-22 DIAGNOSIS — R519 Headache, unspecified: Secondary | ICD-10-CM

## 2019-10-22 DIAGNOSIS — I10 Essential (primary) hypertension: Secondary | ICD-10-CM

## 2019-10-22 DIAGNOSIS — E782 Mixed hyperlipidemia: Secondary | ICD-10-CM

## 2019-10-22 DIAGNOSIS — E559 Vitamin D deficiency, unspecified: Secondary | ICD-10-CM

## 2019-10-22 DIAGNOSIS — F32A Depression, unspecified: Secondary | ICD-10-CM

## 2019-10-22 LAB — COMPREHENSIVE METABOLIC PANEL
ALT: 14 U/L (ref 0–35)
AST: 19 U/L (ref 0–37)
Albumin: 4.4 g/dL (ref 3.5–5.2)
Alkaline Phosphatase: 56 U/L (ref 39–117)
BUN: 24 mg/dL — ABNORMAL HIGH (ref 6–23)
CO2: 29 mEq/L (ref 19–32)
Calcium: 9.4 mg/dL (ref 8.4–10.5)
Chloride: 106 mEq/L (ref 96–112)
Creatinine, Ser: 0.71 mg/dL (ref 0.40–1.20)
GFR: 78.54 mL/min (ref >60.00)
Glucose, Bld: 99 mg/dL (ref 70–99)
Potassium: 5.2 mEq/L — ABNORMAL HIGH (ref 3.5–5.1)
Sodium: 141 mEq/L (ref 135–145)
Total Bilirubin: 0.7 mg/dL (ref 0.2–1.2)
Total Protein: 6.6 g/dL (ref 6.0–8.3)

## 2019-10-22 LAB — TSH: TSH: 1.26 u[IU]/mL (ref 0.35–4.50)

## 2019-10-22 LAB — LIPID PANEL
Cholesterol: 225 mg/dL — ABNORMAL HIGH (ref 0–200)
HDL: 114.7 mg/dL (ref >39.00)
LDL Cholesterol: 97 mg/dL (ref 0–99)
NonHDL: 110.24
Total CHOL/HDL Ratio: 2
Triglycerides: 64 mg/dL (ref 0.0–149.0)
VLDL: 12.8 mg/dL (ref 0.0–40.0)

## 2019-10-22 LAB — CBC WITH DIFFERENTIAL/PLATELET
Basophils Absolute: 0.1 10*3/uL (ref 0.0–0.1)
Basophils Relative: 1 % (ref 0.0–3.0)
Eosinophils Absolute: 0.1 10*3/uL (ref 0.0–0.7)
Eosinophils Relative: 2 % (ref 0.0–5.0)
HCT: 36.2 % (ref 36.0–46.0)
Hemoglobin: 11.9 g/dL — ABNORMAL LOW (ref 12.0–15.0)
Lymphocytes Relative: 21.4 % (ref 12.0–46.0)
Lymphs Abs: 1.2 10*3/uL (ref 0.7–4.0)
MCHC: 32.9 g/dL (ref 30.0–36.0)
MCV: 97.7 fl (ref 78.0–100.0)
Monocytes Absolute: 0.6 10*3/uL (ref 0.1–1.0)
Monocytes Relative: 11.7 % (ref 3.0–12.0)
Neutro Abs: 3.5 10*3/uL (ref 1.4–7.7)
Neutrophils Relative %: 63.9 % (ref 43.0–77.0)
Platelets: 145 10*3/uL — ABNORMAL LOW (ref 150.0–400.0)
RBC: 3.7 Mil/uL — ABNORMAL LOW (ref 3.87–5.11)
RDW: 14.8 % (ref 11.5–15.5)
WBC: 5.5 10*3/uL (ref 4.0–10.5)

## 2019-10-22 LAB — VITAMIN D 25 HYDROXY (VIT D DEFICIENCY, FRACTURES): VITD: 30.83 ng/mL (ref 30.00–100.00)

## 2019-10-22 MED ORDER — BUTALBITAL-APAP-CAFFEINE 50-325-40 MG PO TABS: ORAL_TABLET | ORAL | 1 refills | Status: DC

## 2019-10-22 MED ORDER — AMLODIPINE BESYLATE 10 MG PO TABS: 10.0000 mg | ORAL_TABLET | Freq: Every day | ORAL | 3 refills | Status: DC

## 2019-10-22 NOTE — Progress Notes (Signed)
Subjective:    Patient ID: Sheila Wolf, female    DOB: [birth year], 85 y.o.   MRN: [id number]  HPI  Patient presents for yearly follow up exam. 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  Hypertension -currently prescribed Norvasc 10 mg and metoprolol 50 mg extended release. She denies dizziness, lightheaded, blurred vision, or chest pain.  BP Readings from Last 3 Encounters:  07/11/19 120/64  06/27/19 (!) 186/84  03/21/19 (!) 146/62    Anxiety and Depression -he prescribed Lexapro 20 mg.  She feels well controlled on this medication for the most part. She is feeling a little more " down" recently due to the passing of her brother.   GERD-takes Prilosec 20 mg daily  Hyperlipidemia -not currently on medication. Lab Results  Component Value Date   CHOL 242 (H) 10/16/2018   HDL 103.60 10/16/2018   LDLCALC 123 (H) 10/16/2018   TRIG 78.0 10/16/2018   CHOLHDL 2 10/16/2018   Vitamin D Deficiency -takes over-the-counter vitamin D supplement.  She is unsure of the dosage  Headaches -takes Fioricet. She has been asked multiple times to cut back on dosing. She reports that since her brother died she has been under a lot more stress and she has had to take fiorcet daily.   Arthritis - cervical spine, lumbar spine, and knees. Takes Mobic as needed  All immunizations and health maintenance protocols were reviewed with the patient and needed orders were placed.  She is due for influenza and tetanus  Appropriate screening laboratory values were ordered for the patient including screening of hyperlipidemia, renal function and hepatic function.  Medication reconciliation,  past medical history, social history, problem list and allergies were reviewed in detail with the patient   Goals were established with regard to weight loss, exercise, and  diet in compliance with medications  End of life planning was discussed.  She has an advanced directive and living well   Review of Systems  Constitutional: Negative.   HENT: Negative.   Eyes: Negative.   Respiratory: Negative.   Cardiovascular: Negative.   Gastrointestinal: Negative.   Endocrine: Negative.   Genitourinary: Negative.   Musculoskeletal: Positive for arthralgias and back pain.  Skin: Negative.   Allergic/Immunologic: Negative.   Neurological: Negative.   Hematological: Negative.   Psychiatric/Behavioral: Positive for dysphoric mood.   Past Medical History:  Diagnosis Date  . Anxiety and depression   . Arthritis   . Frequent headaches   . GERD (gastroesophageal reflux disease)   . Hypertension   . Osteoporosis   . 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 Urinary incontinence     Social History   Socioeconomic History  . Marital status: Widowed    Spouse name: Not on file  . Number of children: Not on file  . Years of education: Not on file  . Highest education level: Not on file  Occupational History  . Not on file  Social Needs  . Financial resource strain: Not on file  . Food insecurity    Worry: Not on file    Inability: Not on file  . Transportation needs    Medical: Not on file    Non-medical: Not on file  Tobacco Use  . Smoking status: Former Smoker    Years: 10.00    Types: Cigarettes  . Smokeless tobacco: Never Used  Substance  and Sexual Activity  . Alcohol use: Yes    Alcohol/week: 10.0 standard drinks    Types: 10 Glasses of wine per week  . Drug use: No  . Sexual activity: Never  Lifestyle  . Physical activity    Days per week: Not on file    Minutes per session: Not on file  . Stress: Not on file  Relationships  . Social Herbalist on phone: Not on file    Gets together: Not on file    Attends religious service: Not on file    Active member of club or  organization: Not on file    Attends meetings of clubs or organizations: Not on file    Relationship status: Not on file  . Intimate partner violence    Fear of current or ex partner: Not on file    Emotionally abused: Not on file    Physically abused: Not on file    Forced sexual activity: Not on file  Other Topics Concern  . Not on file  Social History Narrative  . Not on file    Past Surgical History:  Procedure Laterality Date  . APPENDECTOMY    . BUNIONECTOMY    . CERVICAL SPINE SURGERY    . SHOULDER SURGERY    . TONSILLECTOMY AND ADENOIDECTOMY      Family History  Problem Relation Age of Onset  . Arthritis Mother   . Hearing loss Mother   . Heart disease Mother   . Hypertension Mother   . Alcohol abuse Father   . Early death Father   . Heart disease Father   . Stroke Father   . Birth defects Brother   . Heart disease Brother     Allergies  Allergen Reactions  . Penicillins Rash    Current Outpatient Medications on File Prior to Visit  Medication Sig Dispense Refill  . amLODipine (NORVASC) 5 MG tablet TAKE 2 TABLETS BY MOUTH EVERY DAY 180 tablet 0  . Apoaequorin (PREVAGEN) 10 MG CAPS Take 1 capsule by mouth daily.    . betamethasone dipropionate (DIPROLENE) 0.05 % cream APPLY TO AFFECTED AREA UP TO 2 TIMES DAILY AS NEEDED. DO NOT APPLY TO FACE, GROIN, AND UNDERARM    . butalbital-acetaminophen-caffeine (FIORICET) 50-325-40 MG tablet TAKE 1 TABLET BY MOUTH 4 TIMES DAILY AS NEEDED FOR FOR HEADACHE 30 tablet 0  . cetirizine (ZYRTEC) 10 MG tablet TAKE 1 TABLET BY MOUTH EVERY DAY 90 tablet 3  . cycloSPORINE (RESTASIS) 0.05 % ophthalmic emulsion 1 drop 2 (two) times daily.    Marland Kitchen escitalopram (LEXAPRO) 20 MG tablet TAKE 1 TABLET BY MOUTH EVERY DAY 90 tablet 0  . fluticasone (FLONASE) 50 MCG/ACT nasal spray Place 2 sprays into both nostrils daily. 16 g 6  . Melatonin 5 MG CAPS Take by mouth.    . meloxicam (MOBIC) 15 MG tablet TAKE 1 TABLET BY MOUTH EVERY DAY 90  tablet 1  . metoprolol succinate (TOPROL-XL) 50 MG 24 hr tablet TAKE 1 TABLET BY MOUTH EVERY DAY. TAKE WITH OR immediately following A MEAL 90 tablet 3  . NON FORMULARY Juice plus supplement    . omeprazole (PRILOSEC) 20 MG capsule Take 1 capsule (20 mg total) by mouth daily. 90 capsule 3  . Probiotic Product (PROBIOTIC PO) Take by mouth. 7 strand     No current facility-administered medications on file prior to visit.     There were no vitals taken for this visit.  Objective:   Physical Exam Vitals signs and nursing note reviewed.  Constitutional:      Appearance: Normal appearance.  HENT:     Head: Normocephalic.     Right Ear: Tympanic membrane, ear canal and external ear normal. There is no impacted cerumen.     Left Ear: Tympanic membrane, ear canal and external ear normal. There is no impacted cerumen.     Nose: Nose normal. No congestion or rhinorrhea.     Mouth/Throat:     Mouth: Mucous membranes are moist.  Eyes:     Extraocular Movements: Extraocular movements intact.     Pupils: Pupils are equal, round, and reactive to light.  Cardiovascular:     Rate and Rhythm: Normal rate and regular rhythm.     Pulses: Normal pulses.     Heart sounds: Normal heart sounds. No murmur. No friction rub. No gallop.   Pulmonary:     Effort: Pulmonary effort is normal. No respiratory distress.     Breath sounds: Normal breath sounds. No stridor. No wheezing, rhonchi or rales.  Chest:     Chest wall: No tenderness.  Abdominal:     General: Abdomen is flat. Bowel sounds are normal. There is no distension.     Palpations: Abdomen is soft. There is no mass.     Tenderness: There is no abdominal tenderness. There is no right CVA tenderness, left CVA tenderness, guarding or rebound.     Hernia: No hernia is present.  Musculoskeletal: Normal range of motion.        General: No swelling, tenderness, deformity or signs of injury.     Right lower leg: No edema.     Left lower leg: No  edema.  Skin:    General: Skin is warm and dry.     Coloration: Skin is not jaundiced or pale.     Findings: No bruising, erythema, lesion or rash.  Neurological:     General: No focal deficit present.     Mental Status: She is alert and oriented to person, place, and time.     Cranial Nerves: No cranial nerve deficit.     Sensory: No sensory deficit.     Motor: No weakness.     Coordination: Coordination normal.     Gait: Gait normal.  Psychiatric:        Mood and Affect: Mood normal.        Behavior: Behavior normal.        Thought Content: Thought content normal.        Judgment: Judgment normal.       Assessment & Plan:  1. Essential hypertension - Well controlled. No change in medications  - CBC with Differential/Platelet - CMP - Lipid panel - TSH - amLODipine (NORVASC) 10 MG tablet; Take 1 tablet (10 mg total) by mouth daily.  Dispense: 90 tablet; Refill: 3  2. Frequent headaches - Advised to take tylenol for headaches, if not resolved with tylenol then she can take Fioroicet.  - CBC with Differential/Platelet - CMP - Lipid panel - TSH - butalbital-acetaminophen-caffeine (FIORICET) 50-325-40 MG tablet; TAKE 1 TABLET BY MOUTH 4 TIMES DAILY AS NEEDED FOR FOR HEADACHE  Dispense: 30 tablet; Refill: 1  3. Anxiety and depression - Continue with Lexapro   4. Gastroesophageal reflux disease without esophagitis - Continue with prilosec  - CBC with Differential/Platelet - CMP - Lipid panel - TSH  5. Mixed hyperlipidemia - Consider statin  - CBC with Differential/Platelet - CMP -  Lipid panel - TSH  6. Vitamin D deficiency  - Vitamin D, 25-hydroxy  Rojean Ige

## 2019-10-22 NOTE — Patient Instructions (Addendum)
I am going to refill the Fiorcet - but I only want you to take this as needed if the headache does not go away with using Tylenol   We will follow up with you regarding your blood work   Follow up with me in one year or sooner if needed

## 2019-10-29 ENCOUNTER — Telehealth: Payer: Self-pay | Admitting: *Deleted

## 2019-10-29 ENCOUNTER — Ambulatory Visit
Admission: RE | Admit: 2019-10-29 | Discharge: 2019-10-29 | Disposition: A | Discharge status: Home / Self Care | Payer: Medicare Other | Source: Ambulatory Visit | Attending: Adult Health | Admitting: Adult Health

## 2019-10-29 ENCOUNTER — Other Ambulatory Visit: Payer: Self-pay | Admitting: Family Medicine

## 2019-10-29 ENCOUNTER — Other Ambulatory Visit: Payer: Self-pay

## 2019-10-29 DIAGNOSIS — E2839 Other primary ovarian failure: Secondary | ICD-10-CM

## 2019-10-29 NOTE — Telephone Encounter (Signed)
Spoke to the pt and gave her [phone].  She will call to schedule.

## 2019-10-29 NOTE — Telephone Encounter (Signed)
Copied from [place] [id number]. Topic: General - Other >> Oct 29, 2019 12:03 PM Leward Quan A wrote: Reason for CRM: Patient called back regarding a Ph# given to her by [facility] [phone] but when dialed the number is not a working number. She is asking if Misty can please get her a different number and call her back. In case there is no answer please leave a detailed message. Call pt at Ph#  [phone]

## 2019-11-03 ENCOUNTER — Other Ambulatory Visit: Payer: Self-pay

## 2019-11-03 ENCOUNTER — Ambulatory Visit (INDEPENDENT_AMBULATORY_CARE_PROVIDER_SITE_OTHER)
Admission: RE | Admit: 2019-11-03 | Discharge: 2019-11-03 | Disposition: A | Discharge status: Home / Self Care | Payer: Medicare Other | Source: Ambulatory Visit | Attending: Adult Health | Admitting: Adult Health

## 2019-11-03 DIAGNOSIS — E2839 Other primary ovarian failure: Secondary | ICD-10-CM | POA: Diagnosis not present

## 2019-12-04 ENCOUNTER — Other Ambulatory Visit: Payer: Self-pay | Admitting: Adult Health

## 2019-12-04 DIAGNOSIS — F32A Depression, unspecified: Secondary | ICD-10-CM

## 2019-12-04 DIAGNOSIS — F419 Anxiety disorder, unspecified: Secondary | ICD-10-CM

## 2019-12-04 DIAGNOSIS — I10 Essential (primary) hypertension: Secondary | ICD-10-CM

## 2019-12-04 DIAGNOSIS — F329 Major depressive disorder, single episode, unspecified: Secondary | ICD-10-CM

## 2019-12-04 DIAGNOSIS — Z760 Encounter for issue of repeat prescription: Secondary | ICD-10-CM

## 2019-12-11 ENCOUNTER — Ambulatory Visit: Payer: Medicare Other | Attending: Internal Medicine

## 2019-12-11 DIAGNOSIS — Z23 Encounter for immunization: Secondary | ICD-10-CM

## 2019-12-11 NOTE — Progress Notes (Signed)
   Covid-19 Vaccination Clinic  Name:  Sheila Wolf    MRN: [id number] DOB: 09/21/1934  12/11/2019  [name] was observed post Covid-19 immunization for 15 minutes without incidence. She was provided with Vaccine Information Sheet and instruction to access the V-Safe system.   [name] was instructed to call 911 with any severe reactions post vaccine: Marland Kitchen Difficulty breathing  . Swelling of your face and throat  . A fast heartbeat  . A bad rash all over your body  . Dizziness and weakness    Immunizations Administered    Name Date Dose VIS Date Route   Pfizer COVID-19 Vaccine 12/11/2019 10:46 AM 0.3 mL 10/31/2019 Intramuscular   Manufacturer: [place]   Lot: [id number]   [place]: [id number]

## 2020-01-01 ENCOUNTER — Ambulatory Visit: Payer: Medicare Other | Attending: Internal Medicine

## 2020-01-01 DIAGNOSIS — Z23 Encounter for immunization: Secondary | ICD-10-CM

## 2020-01-01 NOTE — Progress Notes (Signed)
   Covid-19 Vaccination Clinic  Name:  Sheila Wolf    MRN: [id number] DOB: 08-08-1934  01/01/2020  [name] was observed post Covid-19 immunization for 15 minutes without incidence. She was provided with Vaccine Information Sheet and instruction to access the V-Safe system.   [name] was instructed to call 911 with any severe reactions post vaccine: Marland Kitchen Difficulty breathing  . Swelling of your face and throat  . A fast heartbeat  . A bad rash all over your body  . Dizziness and weakness    Immunizations Administered    Name Date Dose VIS Date Route   Pfizer COVID-19 Vaccine 01/01/2020 10:57 AM 0.3 mL 10/31/2019 Intramuscular   Manufacturer: [place]   Lot: EN [place]   [place]: [id number]

## 2020-01-05 ENCOUNTER — Ambulatory Visit: Payer: Medicare Other

## 2020-01-27 IMAGING — DX DG CHEST 2V
2 series · 2 of 2 positions shown · non-contrast
Comparison: No priors.

CLINICAL DATA: 84-year-old female with history of chest heaviness
since yesterday evening.

EXAM:
CHEST - 2 VIEW

[chest pa]
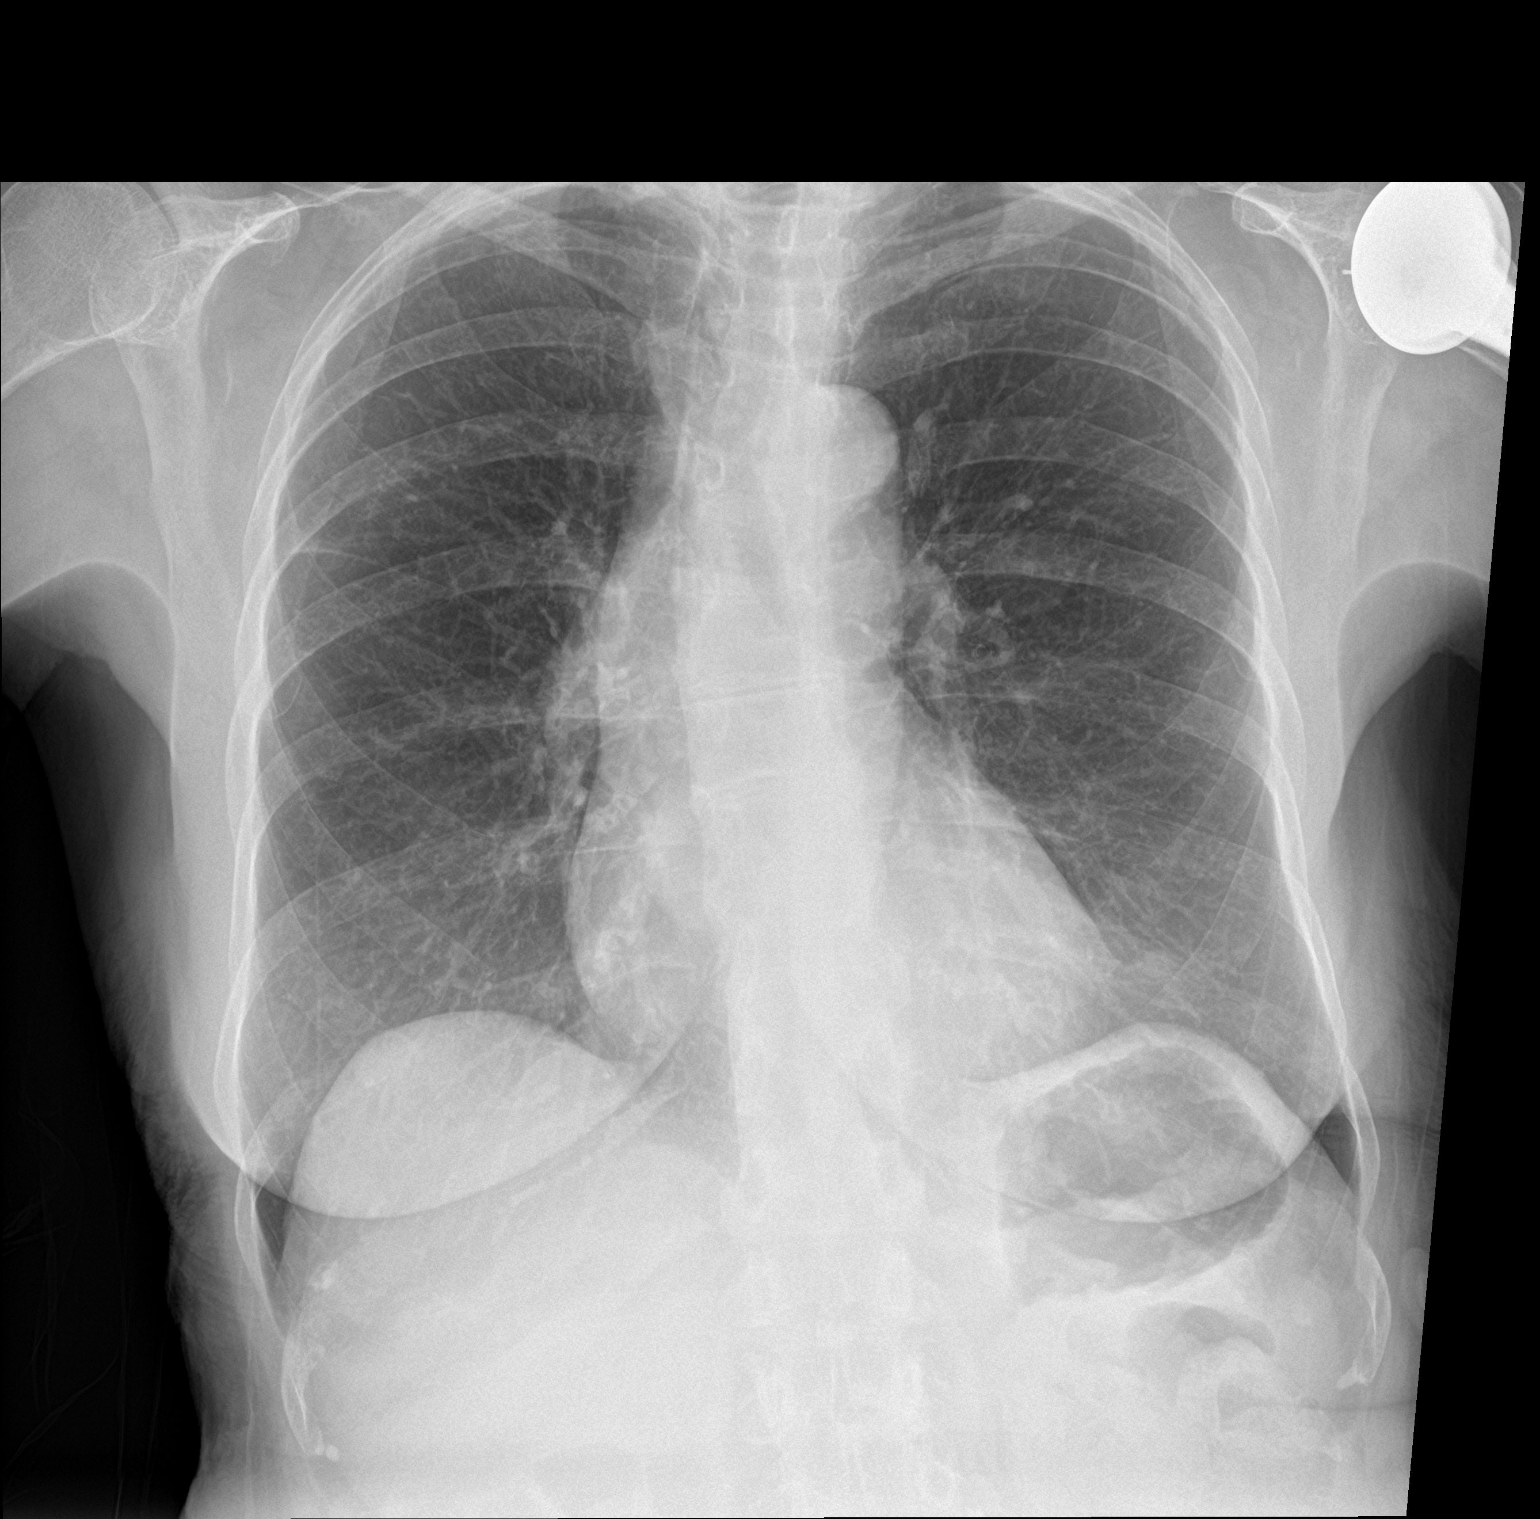

[chest lat]
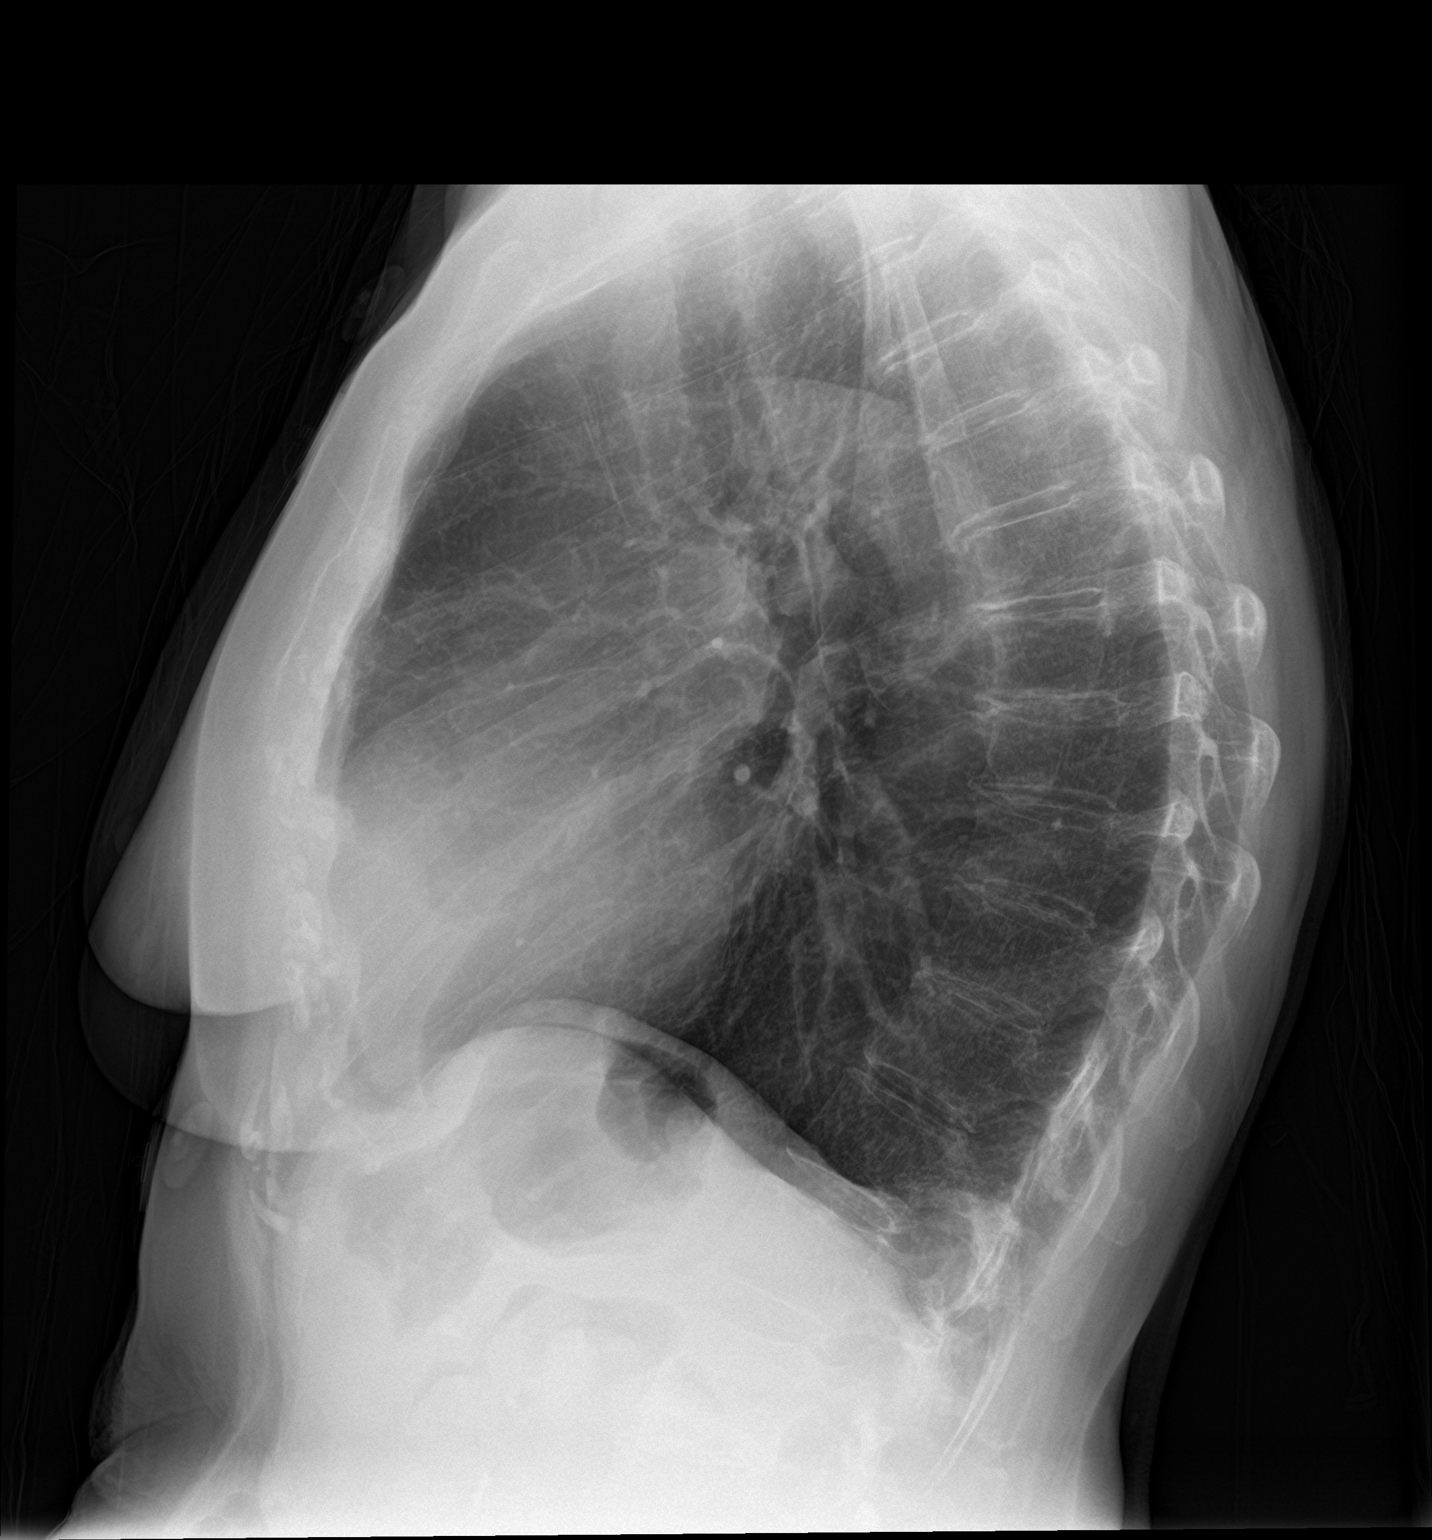

[2 of 2 positions shown; findings below may reference images not displayed]

FINDINGS: Lung volumes are normal. No consolidative airspace disease. No
pleural effusions. No pneumothorax. No pulmonary nodule or mass
noted. Pulmonary vasculature and the cardiomediastinal silhouette
are within normal limits. Atherosclerosis in the thoracic aorta.
Status post left shoulder arthroplasty.
IMPRESSION: 1.  No radiographic evidence of acute cardiopulmonary disease.
2. Aortic atherosclerosis.

## 2020-03-04 ENCOUNTER — Other Ambulatory Visit: Payer: Self-pay | Admitting: Adult Health

## 2020-03-04 DIAGNOSIS — Z760 Encounter for issue of repeat prescription: Secondary | ICD-10-CM

## 2020-03-04 DIAGNOSIS — F329 Major depressive disorder, single episode, unspecified: Secondary | ICD-10-CM

## 2020-03-04 DIAGNOSIS — F32A Depression, unspecified: Secondary | ICD-10-CM

## 2020-03-04 DIAGNOSIS — F419 Anxiety disorder, unspecified: Secondary | ICD-10-CM

## 2020-03-04 NOTE — Telephone Encounter (Signed)
Sent to the pharmacy by e-scribe. 

## 2020-04-01 DIAGNOSIS — H43812 Vitreous degeneration, left eye: Secondary | ICD-10-CM | POA: Diagnosis not present

## 2020-04-17 DIAGNOSIS — Z9889 Other specified postprocedural states: Secondary | ICD-10-CM | POA: Diagnosis not present

## 2020-04-17 DIAGNOSIS — R2242 Localized swelling, mass and lump, left lower limb: Secondary | ICD-10-CM | POA: Diagnosis not present

## 2020-05-05 ENCOUNTER — Other Ambulatory Visit: Payer: Self-pay

## 2020-05-06 ENCOUNTER — Encounter: Payer: Self-pay | Admitting: Adult Health

## 2020-05-06 ENCOUNTER — Ambulatory Visit (INDEPENDENT_AMBULATORY_CARE_PROVIDER_SITE_OTHER): Payer: Medicare Other | Admitting: Adult Health

## 2020-05-06 VITALS — BP 110/54 | Temp 98.1°F | Wt 126.0 lb

## 2020-05-06 DIAGNOSIS — R600 Localized edema: Secondary | ICD-10-CM | POA: Diagnosis not present

## 2020-05-06 NOTE — Progress Notes (Signed)
Subjective:    Patient ID: Sheila Wolf, female    DOB: 1934/09/22, 86 y.o.   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  She was seen on 04/17/2020 at urgent care after she noticed left foot was swollen but denied any pain.  She was started on Bactrim for cellulitis.  X-ray was negative for acute fracture.  Today she reports that the swelling has improved and continues to have no pain.  She is concerned because she is believes that she still has a little bit of edema to the left foot.   Review of Systems See HPI   Past Medical History:  Diagnosis Date  . Anxiety and depression   . Arthritis   . Frequent headaches   . GERD (gastroesophageal reflux disease)   . Hypertension   . Osteoporosis   . 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 Urinary incontinence     Social History   Socioeconomic History  . Marital status: Widowed    Spouse name: Not on file  . Number of children: Not on file  . Years of education: Not on file  . Highest education level: Not on file  Occupational History  . Not on file  Tobacco Use  . Smoking status: Former Smoker    Years: 10.00    Types: Cigarettes  . Smokeless tobacco: Never Used  Vaping Use  . Vaping Use: Never used  Substance and Sexual Activity  . Alcohol use: Yes    Alcohol/week: 10.0 standard drinks    Types: 10 Glasses of wine per week  . Drug use: No  . Sexual activity: Never  Other Topics Concern  . Not on file  Social History Narrative  . Not on file   Social Determinants of Health   Financial Resource Strain:   . Difficulty of Paying Living Expenses:   Food Insecurity:   . Worried About Charity fundraiser in the Last Year:   . Arboriculturist in the Last Year:   Transportation Needs:   . Film/video editor  (Medical):   Marland Kitchen Lack of Transportation (Non-Medical):   Physical Activity:   . Days of Exercise per Week:   . Minutes of Exercise per Session:   Stress:   . Feeling of Stress :   Social Connections:   . Frequency of Communication with Friends and Family:   . Frequency of Social Gatherings with Friends and Family:   . Attends Religious Services:   . Active Member of Clubs or Organizations:   . Attends Archivist Meetings:   Marland Kitchen Marital Status:   Intimate Partner Violence:   . Fear of Current or Ex-Partner:   . Emotionally Abused:   Marland Kitchen Physically Abused:   . Sexually Abused:     Past Surgical History:  Procedure Laterality Date  . APPENDECTOMY    . BUNIONECTOMY    . CERVICAL SPINE SURGERY    . SHOULDER SURGERY    . TONSILLECTOMY AND ADENOIDECTOMY      Family History  Problem Relation Age of Onset  . Arthritis Mother   . Hearing loss Mother   . Heart disease Mother   . Hypertension Mother   . Alcohol abuse Father   . Early death Father   . Heart disease Father   . Stroke Father   . Birth defects  Brother   . Heart disease Brother     Allergies  Allergen Reactions  . Penicillins Rash    Current Outpatient Medications on File Prior to Visit  Medication Sig Dispense Refill  . amLODipine (NORVASC) 10 MG tablet Take 1 tablet (10 mg total) by mouth daily. 90 tablet 3  . amLODipine (NORVASC) 5 MG tablet TAKE 2 TABLETS BY MOUTH EVERY DAY 90 tablet 0  . Apoaequorin (PREVAGEN) 10 MG CAPS Take 1 capsule by mouth daily.    . butalbital-acetaminophen-caffeine (FIORICET) 50-325-40 MG tablet TAKE 1 TABLET BY MOUTH 4 TIMES DAILY AS NEEDED FOR FOR HEADACHE 30 tablet 1  . cetirizine (ZYRTEC) 10 MG tablet TAKE 1 TABLET BY MOUTH EVERY DAY 90 tablet 3  . cycloSPORINE (RESTASIS) 0.05 % ophthalmic emulsion 1 drop 2 (two) times daily.    Marland Kitchen escitalopram (LEXAPRO) 20 MG tablet TAKE 1 TABLET BY MOUTH EVERY DAY 90 tablet 1  . fluticasone (FLONASE) 50 MCG/ACT nasal spray Place 2 sprays  into both nostrils daily. 16 g 6  . Melatonin 5 MG CAPS Take by mouth.    . meloxicam (MOBIC) 15 MG tablet TAKE 1 TABLET BY MOUTH EVERY DAY 90 tablet 1  . metoprolol succinate (TOPROL-XL) 50 MG 24 hr tablet TAKE 1 TABLET BY MOUTH EVERY DAY. TAKE WITH OR immediately following A MEAL 90 tablet 3  . NON FORMULARY Juice plus supplement    . omeprazole (PRILOSEC) 20 MG capsule Take 1 capsule (20 mg total) by mouth daily. 90 capsule 3   No current facility-administered medications on file prior to visit.    BP (!) 110/54   Temp 98.1 F (36.7 C)   Wt 126 lb (57.2 kg)   BMI 22.68 kg/m       Objective:   Physical Exam Vitals and nursing note reviewed.  Musculoskeletal:        General: Swelling present. No tenderness.     Right lower leg: No edema.     Left lower leg: No edema.     Comments: Very mild soft tissue swelling located to dorsal aspect of right foot.  No redness, warmth, or pain noted  Skin:    General: Skin is warm and dry.  Neurological:     General: No focal deficit present.     Mental Status: She is oriented to person, place, and time.  Psychiatric:        Mood and Affect: Mood normal.        Behavior: Behavior normal.        Thought Content: Thought content normal.        Judgment: Judgment normal.       Assessment & Plan:  1. Lower extremity edema -Advised that I was not concerned about current cellulitis, or cardiac issue.  She can elevate her leg and use ice and the soft tissue swelling should resolve.  Follow-up as needed  Dorothyann Peng, NP

## 2020-05-06 NOTE — Progress Notes (Signed)
° °  Subjective:    Patient ID: Sheila Wolf, female    DOB: 1934-07-21, 86 y.o.   MRN: [id number]  HPI    Review of Systems     Objective:   Physical Exam        Assessment & Plan:

## 2020-06-04 ENCOUNTER — Other Ambulatory Visit: Payer: Self-pay | Admitting: Adult Health

## 2020-06-04 DIAGNOSIS — Z760 Encounter for issue of repeat prescription: Secondary | ICD-10-CM

## 2020-06-08 NOTE — Telephone Encounter (Signed)
SENT TO THE PHARMACY BY E-SCRIBE. 

## 2020-06-10 ENCOUNTER — Other Ambulatory Visit: Payer: Self-pay

## 2020-06-10 ENCOUNTER — Ambulatory Visit (INDEPENDENT_AMBULATORY_CARE_PROVIDER_SITE_OTHER): Payer: Medicare Other | Admitting: Adult Health

## 2020-06-10 ENCOUNTER — Encounter: Payer: Self-pay | Admitting: Adult Health

## 2020-06-10 VITALS — BP 118/60 | Temp 98.3°F | Wt 128.0 lb

## 2020-06-10 DIAGNOSIS — M25472 Effusion, left ankle: Secondary | ICD-10-CM

## 2020-06-10 NOTE — Progress Notes (Signed)
Subjective:    Patient ID: Sheila Wolf, female    DOB: [birth year], 86 y.o.   MRN: [id number]  HPI She presents to the office today for the complaint of swelling in her left ankle.  She was seen on 05/06/2020 after being seen at urgent care when she noticed her left foot was swollen but denied any pain.  She was started on Bactrim for cellulitis by the urgent care and had an x-ray which was negative for acute fracture.  She is concerned because she still has edema around her left ankle.  She continues to deny any pain, redness, or warmth    Review of Systems See HPI   Past Medical History:  Diagnosis Date  . Anxiety and depression   . Arthritis   . Frequent headaches   . GERD (gastroesophageal reflux disease)   . Hypertension   . Osteoporosis   . 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 Urinary incontinence     Social History   Socioeconomic History  . Marital status: Widowed    Spouse name: Not on file  . Number of children: Not on file  . Years of education: Not on file  . Highest education level: Not on file  Occupational History  . Not on file  Tobacco Use  . Smoking status: Former Smoker    Years: 10.00    Types: Cigarettes  . Smokeless tobacco: Never Used  Vaping Use  . Vaping Use: Never used  Substance and Sexual Activity  . Alcohol use: Yes    Alcohol/week: 10.0 standard drinks    Types: 10 Glasses of wine per week  . Drug use: No  . Sexual activity: Never  Other Topics Concern  . Not on file  Social History Narrative  . Not on file   Social Determinants of Health   Financial Resource Strain:   . Difficulty of Paying Living Expenses:   Food Insecurity:   . Worried About Charity fundraiser in the Last Year:   . Arboriculturist in the Last Year:   Transportation Needs:   . Film/video editor (Medical):   Marland Kitchen Lack of Transportation (Non-Medical):   Physical Activity:   . Days of Exercise per Week:   . Minutes of Exercise per Session:     Stress:   . Feeling of Stress :   Social Connections:   . Frequency of Communication with Friends and Family:   . Frequency of Social Gatherings with Friends and Family:   . Attends Religious Services:   . Active Member of Clubs or Organizations:   . Attends Archivist Meetings:   Marland Kitchen Marital Status:   Intimate Partner Violence:   . Fear of Current or Ex-Partner:   . Emotionally Abused:   Marland Kitchen Physically Abused:   . Sexually Abused:     Past Surgical History:  Procedure Laterality Date  . APPENDECTOMY    . BUNIONECTOMY    . CERVICAL SPINE SURGERY    . SHOULDER SURGERY    . TONSILLECTOMY AND ADENOIDECTOMY      Family History  Problem Relation Age of Onset  . Arthritis Mother   . Hearing loss Mother   . Heart disease Mother   . Hypertension Mother   . Alcohol abuse Father   . Early death Father   . Heart disease Father   . Stroke Father   . Birth defects Brother   . Heart disease Brother     Allergies  Allergen  Reactions  . Penicillins Rash    Current Outpatient Medications on File Prior to Visit  Medication Sig Dispense Refill  . amLODipine (NORVASC) 10 MG tablet Take 1 tablet (10 mg total) by mouth daily. 90 tablet 3  . amLODipine (NORVASC) 5 MG tablet TAKE 2 TABLETS BY MOUTH EVERY DAY 90 tablet 0  . Apoaequorin (PREVAGEN) 10 MG CAPS Take 1 capsule by mouth daily.    . butalbital-acetaminophen-caffeine (FIORICET) 50-325-40 MG tablet TAKE 1 TABLET BY MOUTH 4 TIMES DAILY AS NEEDED FOR FOR HEADACHE 30 tablet 1  . cetirizine (ZYRTEC) 10 MG tablet TAKE 1 TABLET BY MOUTH EVERY DAY 90 tablet 3  . cycloSPORINE (RESTASIS) 0.05 % ophthalmic emulsion 1 drop 2 (two) times daily.    Marland Kitchen escitalopram (LEXAPRO) 20 MG tablet TAKE 1 TABLET BY MOUTH EVERY DAY 90 tablet 1  . fluticasone (FLONASE) 50 MCG/ACT nasal spray Place 2 sprays into both nostrils daily. 16 g 6  . Melatonin 5 MG CAPS Take by mouth.    . meloxicam (MOBIC) 15 MG tablet TAKE 1 TABLET BY MOUTH EVERY DAY 90  tablet 1  . metoprolol succinate (TOPROL-XL) 50 MG 24 hr tablet TAKE 1 TABLET BY MOUTH EVERY DAY. TAKE WITH OR immediately following A MEAL 90 tablet 3  . NON FORMULARY Juice plus supplement    . omeprazole (PRILOSEC) 20 MG capsule TAKE 1 CAPSULE BY MOUTH EVERY DAY 90 capsule 1   No current facility-administered medications on file prior to visit.    BP (!) 118/60   Temp 98.3 F (36.8 C)   Wt 128 lb (58.1 kg)   BMI 23.04 kg/m       Objective:   Physical Exam Vitals and nursing note reviewed.  Constitutional:      Appearance: Normal appearance.  Musculoskeletal:     Right lower leg: No edema.     Left lower leg: Edema (Trace pitting edema located along the medial aspect of her left ankle.) present.  Skin:    General: Skin is warm and dry.     Capillary Refill: Capillary refill takes less than 2 seconds.  Neurological:     General: No focal deficit present.     Mental Status: She is alert and oriented to person, place, and time.  Psychiatric:        Mood and Affect: Mood normal.        Behavior: Behavior normal.        Thought Content: Thought content normal.        Judgment: Judgment normal.       Assessment & Plan:  1. Ankle swelling, left -No concern for CHF, DVT, cellulitis, or any other acute abnormality.  Advised that she can try using compression socks throughout the day -Elevate legs at rest, maintain low-sodium diet, and stay well-hydrated  Dorothyann Peng, NP

## 2020-06-10 NOTE — Patient Instructions (Signed)
You have very mild swelling around the left ankle. This is nothing to be concerned about. I think getting some compression socks at a local pharmacy or through [place] would be great   Cetaphil, Gold Bond, or Eucerin are all great lotions

## 2020-06-10 NOTE — Progress Notes (Signed)
   Subjective:    Patient ID: Sheila Wolf, female    DOB: [birth year], 86 y.o.   MRN: [id number]  HPI    Review of Systems     Objective:   Physical Exam        Assessment & Plan:

## 2020-06-30 ENCOUNTER — Other Ambulatory Visit: Payer: Self-pay | Admitting: Adult Health

## 2020-06-30 NOTE — Telephone Encounter (Signed)
Sent to the pharmacy by e-scribe. 

## 2020-07-13 ENCOUNTER — Telehealth: Payer: Medicare Other | Admitting: Adult Health

## 2020-07-16 ENCOUNTER — Other Ambulatory Visit: Payer: Self-pay | Admitting: Adult Health

## 2020-07-16 DIAGNOSIS — R519 Headache, unspecified: Secondary | ICD-10-CM

## 2020-07-30 ENCOUNTER — Other Ambulatory Visit: Payer: Self-pay

## 2020-08-02 ENCOUNTER — Ambulatory Visit (INDEPENDENT_AMBULATORY_CARE_PROVIDER_SITE_OTHER): Payer: Medicare Other | Admitting: Family Medicine

## 2020-08-02 ENCOUNTER — Other Ambulatory Visit: Payer: Self-pay

## 2020-08-02 ENCOUNTER — Encounter: Payer: Self-pay | Admitting: Family Medicine

## 2020-08-02 VITALS — BP 140/70 | HR 67 | Temp 98.6°F | Ht 62.0 in | Wt 130.2 lb

## 2020-08-02 DIAGNOSIS — Z23 Encounter for immunization: Secondary | ICD-10-CM

## 2020-08-02 DIAGNOSIS — M25473 Effusion, unspecified ankle: Secondary | ICD-10-CM | POA: Diagnosis not present

## 2020-08-02 DIAGNOSIS — I10 Essential (primary) hypertension: Secondary | ICD-10-CM

## 2020-08-02 MED ORDER — METOPROLOL SUCCINATE ER 50 MG PO TB24: 100.0000 mg | ORAL_TABLET | Freq: Every day | ORAL | 3 refills | Status: DC

## 2020-08-02 MED ORDER — AMLODIPINE BESYLATE 10 MG PO TABS: 5.0000 mg | ORAL_TABLET | Freq: Every day | ORAL | 3 refills | Status: DC

## 2020-08-02 NOTE — Addendum Note (Signed)
Addended by: Matilde Sprang on: 08/02/2020 04:51 PM   Modules accepted: Orders

## 2020-08-02 NOTE — Progress Notes (Signed)
   Subjective:    Patient ID: Sheila Wolf, female    DOB: 10/04/1934, 86 y.o.   MRN: [id number]  HPI Here for 6 months of mild swelling in the left ankle. This comes and goes. She says it feels "tight" but there is no pain. No leg swelling or pain. No SOB. Her renal function is normal. Her BP has been stable.    Review of Systems  Constitutional: Negative.   Respiratory: Negative.   Cardiovascular: Positive for leg swelling. Negative for chest pain and palpitations.       Objective:   Physical Exam Constitutional:      Appearance: Normal appearance. She is not ill-appearing.  Cardiovascular:     Rate and Rhythm: Normal rate and regular rhythm.     Pulses: Normal pulses.     Heart sounds: Normal heart sounds.  Pulmonary:     Effort: Pulmonary effort is normal.     Breath sounds: Normal breath sounds.  Musculoskeletal:     Right lower leg: No edema.     Comments: The left ankle has trace edema. No erythema or warmth or tenderness. The left calf is not tender   Neurological:     Mental Status: She is alert.           Assessment & Plan:  Benign ankle edema with stable HTN. Part of this may be due to side effects from Amlodipine. She will take 1/2 tab of Amlodipine (total of 5 mg) daily and she will increase the Metoprolol to 2 tabs (total of 100 mg) daily. Recheck in 3 weeks.  Alysia Penna, MD

## 2020-08-10 ENCOUNTER — Other Ambulatory Visit: Payer: Self-pay | Admitting: Adult Health

## 2020-08-10 DIAGNOSIS — I10 Essential (primary) hypertension: Secondary | ICD-10-CM

## 2020-08-19 ENCOUNTER — Other Ambulatory Visit: Payer: Self-pay | Admitting: Adult Health

## 2020-08-25 DIAGNOSIS — Z23 Encounter for immunization: Secondary | ICD-10-CM | POA: Diagnosis not present

## 2020-09-01 DIAGNOSIS — M1712 Unilateral primary osteoarthritis, left knee: Secondary | ICD-10-CM | POA: Diagnosis not present

## 2020-09-09 ENCOUNTER — Other Ambulatory Visit: Payer: Self-pay | Admitting: Adult Health

## 2020-09-09 DIAGNOSIS — F32A Depression, unspecified: Secondary | ICD-10-CM

## 2020-09-09 DIAGNOSIS — F419 Anxiety disorder, unspecified: Secondary | ICD-10-CM

## 2020-09-09 DIAGNOSIS — Z760 Encounter for issue of repeat prescription: Secondary | ICD-10-CM

## 2020-09-14 ENCOUNTER — Other Ambulatory Visit: Payer: Self-pay

## 2020-09-14 ENCOUNTER — Ambulatory Visit (INDEPENDENT_AMBULATORY_CARE_PROVIDER_SITE_OTHER): Payer: Medicare Other | Admitting: Adult Health

## 2020-09-14 ENCOUNTER — Encounter: Payer: Self-pay | Admitting: Adult Health

## 2020-09-14 VITALS — BP 122/74 | HR 71 | Temp 98.4°F | Ht 62.0 in | Wt 131.0 lb

## 2020-09-14 DIAGNOSIS — S0990XA Unspecified injury of head, initial encounter: Secondary | ICD-10-CM | POA: Diagnosis not present

## 2020-09-14 NOTE — Progress Notes (Signed)
Subjective:    Patient ID: Sheila Wolf, female    DOB: 28-Feb-1934, 86 y.o.   MRN: [id number]  HPI  86 year old female who  has a past medical history of Anxiety and depression, Arthritis, Frequent headaches, GERD (gastroesophageal reflux disease), Hypertension, Osteoporosis, Thrombocytopathia ([place]), and Urinary incontinence.  She presents to the office today for an acute issue of hematoma on left forehead. She reports that two days ago she was in bed taking a nap and heard someone at the door, when she went to roll out of bed she hit the left side of her head on a dresser. She denies LOC, headache, blurred vision, sleepiness, or fatigue. She is not on blood thinners.   Review of Systems See HPI   Past Medical History:  Diagnosis Date  . Anxiety and depression   . Arthritis   . Frequent headaches   . GERD (gastroesophageal reflux disease)   . Hypertension   . Osteoporosis   . Thrombocytopathia ([place])   . Urinary incontinence     Social History   Socioeconomic History  . Marital status: Widowed    Spouse name: Not on file  . Number of children: Not on file  . Years of education: Not on file  . Highest education level: Not on file  Occupational History  . Not on file  Tobacco Use  . Smoking status: Former Smoker    Years: 10.00    Types: Cigarettes  . Smokeless tobacco: Never Used  Vaping Use  . Vaping Use: Never used  Substance and Sexual Activity  . Alcohol use: Yes    Alcohol/week: 10.0 standard drinks    Types: 10 Glasses of wine per week  . Drug use: No  . Sexual activity: Never  Other Topics Concern  . Not on file  Social History Narrative  . Not on file   Social Determinants of Health   Financial Resource Strain:   . Difficulty of Paying Living Expenses: Not on file  Food Insecurity:   . Worried About Charity fundraiser in the Last Year: Not on file  . Ran Out of Food in the Last Year: Not on file  Transportation Needs:   . Lack of  Transportation (Medical): Not on file  . Lack of Transportation (Non-Medical): Not on file  Physical Activity:   . Days of Exercise per Week: Not on file  . Minutes of Exercise per Session: Not on file  Stress:   . Feeling of Stress : Not on file  Social Connections:   . Frequency of Communication with Friends and Family: Not on file  . Frequency of Social Gatherings with Friends and Family: Not on file  . Attends Religious Services: Not on file  . Active Member of Clubs or Organizations: Not on file  . Attends Archivist Meetings: Not on file  . Marital Status: Not on file  Intimate Partner Violence:   . Fear of Current or Ex-Partner: Not on file  . Emotionally Abused: Not on file  . Physically Abused: Not on file  . Sexually Abused: Not on file    Past Surgical History:  Procedure Laterality Date  . APPENDECTOMY    . BUNIONECTOMY    . CERVICAL SPINE SURGERY    . SHOULDER SURGERY    . TONSILLECTOMY AND ADENOIDECTOMY      Family History  Problem Relation Age of Onset  . Arthritis Mother   . Hearing loss Mother   . Heart  disease Mother   . Hypertension Mother   . Alcohol abuse Father   . Early death Father   . Heart disease Father   . Stroke Father   . Birth defects Brother   . Heart disease Brother     Allergies  Allergen Reactions  . Penicillins Rash    Current Outpatient Medications on File Prior to Visit  Medication Sig Dispense Refill  . amLODipine (NORVASC) 5 MG tablet amlodipine 5 mg tablet  TAKE 2 TABLETS BY MOUTH EVERY DAY    . Apoaequorin (PREVAGEN) 10 MG CAPS Take 1 capsule by mouth daily.    . butalbital-acetaminophen-caffeine (FIORICET) 50-325-40 MG tablet TAKE 1 TABLET BY MOUTH 4 TIMES DAILY AS NEEDED FOR HEADACHE 30 tablet 1  . cetirizine (ZYRTEC) 10 MG tablet TAKE 1 TABLET BY MOUTH EVERY DAY 90 tablet 3  . cycloSPORINE (RESTASIS) 0.05 % ophthalmic emulsion 1 drop 2 (two) times daily.    Marland Kitchen escitalopram (LEXAPRO) 20 MG tablet TAKE 1  TABLET BY MOUTH EVERY DAY 90 tablet 0  . fluticasone (FLONASE) 50 MCG/ACT nasal spray Place 2 sprays into both nostrils daily. 16 g 6  . Melatonin 5 MG CAPS Take by mouth.    . meloxicam (MOBIC) 15 MG tablet TAKE 1 TABLET BY MOUTH EVERY DAY 90 tablet 1  . metoprolol succinate (TOPROL-XL) 50 MG 24 hr tablet TAKE 1 TABLET BY MOUTH EVERY DAY WITH OR IMMEDIATELY FOLLOWING A MEAL 90 tablet 3  . nystatin-triamcinolone ointment (MYCOLOG) Apply topically 2 (two) times daily.    Marland Kitchen omeprazole (PRILOSEC) 20 MG capsule TAKE 1 CAPSULE BY MOUTH EVERY DAY 90 capsule 1  . traMADol (ULTRAM) 50 MG tablet tramadol 50 mg tablet  Take 1 tablet every 6 hours by oral route.     No current facility-administered medications on file prior to visit.    BP 122/74 (BP Location: Left Arm, Patient Position: Sitting, Cuff Size: Normal)   Pulse 71   Temp 98.4 F (36.9 C) (Oral)   Ht 5\' 2"  (1.575 m)   Wt 131 lb (59.4 kg)   SpO2 97%   BMI 23.96 kg/m       Objective:   Physical Exam Vitals and nursing note reviewed.  Constitutional:      Appearance: Normal appearance.  HENT:     Head:   Eyes:     Extraocular Movements: Extraocular movements intact.     Conjunctiva/sclera: Conjunctivae normal.     Pupils: Pupils are equal, round, and reactive to light.  Skin:    General: Skin is warm and dry.     Capillary Refill: Capillary refill takes less than 2 seconds.  Neurological:     Mental Status: She is alert.  Psychiatric:        Mood and Affect: Mood normal.        Behavior: Behavior normal.        Thought Content: Thought content normal.        Judgment: Judgment normal.       Assessment & Plan:  1. Traumatic injury of head, initial encounter - Silver dollar sized bruise in various stages of healing on left forehead. No signs of concussion or intercranial bleeding. Follow up PRN   Sheila Wolf

## 2020-09-25 ENCOUNTER — Other Ambulatory Visit: Payer: Self-pay | Admitting: Adult Health

## 2020-09-25 DIAGNOSIS — R519 Headache, unspecified: Secondary | ICD-10-CM

## 2020-11-01 ENCOUNTER — Other Ambulatory Visit: Payer: Self-pay | Admitting: Adult Health

## 2020-11-01 DIAGNOSIS — I10 Essential (primary) hypertension: Secondary | ICD-10-CM

## 2020-11-09 ENCOUNTER — Other Ambulatory Visit: Payer: Self-pay | Admitting: Adult Health

## 2020-11-09 DIAGNOSIS — Z760 Encounter for issue of repeat prescription: Secondary | ICD-10-CM

## 2020-11-19 ENCOUNTER — Other Ambulatory Visit: Payer: Self-pay | Admitting: Adult Health

## 2020-11-19 DIAGNOSIS — R519 Headache, unspecified: Secondary | ICD-10-CM

## 2020-11-22 ENCOUNTER — Other Ambulatory Visit: Payer: Self-pay | Admitting: Adult Health

## 2020-11-22 ENCOUNTER — Telehealth: Payer: Self-pay | Admitting: Adult Health

## 2020-11-22 DIAGNOSIS — Z760 Encounter for issue of repeat prescription: Secondary | ICD-10-CM

## 2020-11-22 DIAGNOSIS — F32A Depression, unspecified: Secondary | ICD-10-CM

## 2020-11-22 DIAGNOSIS — F419 Anxiety disorder, unspecified: Secondary | ICD-10-CM

## 2020-11-22 NOTE — Telephone Encounter (Signed)
Left message for patient to call back and schedule Medicare Annual Wellness Visit (AWV) either virtually or in office.   Last AWV no information please schedule at anytime with [facility] Nurse Health Advisor 1 or 2   This should be a 45 minute visit. 

## 2020-11-25 ENCOUNTER — Telehealth: Payer: Self-pay | Admitting: Adult Health

## 2020-11-25 DIAGNOSIS — H524 Presbyopia: Secondary | ICD-10-CM | POA: Diagnosis not present

## 2020-11-25 DIAGNOSIS — Z961 Presence of intraocular lens: Secondary | ICD-10-CM | POA: Diagnosis not present

## 2020-11-25 DIAGNOSIS — H04123 Dry eye syndrome of bilateral lacrimal glands: Secondary | ICD-10-CM | POA: Diagnosis not present

## 2020-11-25 NOTE — Telephone Encounter (Signed)
Handicap Placrd form to be filled out--placed in dr's folder.  Call [phone] upon completion.

## 2020-11-30 NOTE — Telephone Encounter (Signed)
Left a message on voicemail informing the pt that the form is available for pick up at the front desk.  Nothing further needed.

## 2020-11-30 NOTE — Telephone Encounter (Signed)
Form received and waiting on signature.

## 2020-12-07 ENCOUNTER — Telehealth: Payer: Self-pay | Admitting: Adult Health

## 2020-12-07 ENCOUNTER — Ambulatory Visit: Payer: Medicare Other

## 2020-12-07 NOTE — Telephone Encounter (Signed)
Noted  

## 2020-12-07 NOTE — Telephone Encounter (Signed)
[facility] Resident Medical Information form to be filled out--placed in dr's folder.  Fax to [employer], Attn: Baldemar Lenis

## 2020-12-09 NOTE — Telephone Encounter (Signed)
Form has been faxed.  Received confirmation the transaction was successful.  Original sent to scan.  Nothing further needed.

## 2020-12-21 ENCOUNTER — Other Ambulatory Visit: Payer: Self-pay | Admitting: Adult Health

## 2020-12-23 NOTE — Telephone Encounter (Signed)
This prescription was filled on 12/21/2020. Any refills authorized will be placed on file.  Pt should have enough medication to get to her cpx on 01/18/21.  Will hold refill request.

## 2020-12-23 NOTE — Telephone Encounter (Signed)
LEFT A MESSAGE FOR A RETURN CALL.  PT NEEDS TO BE SCHEDULED FOR CPX. 

## 2020-12-23 NOTE — Telephone Encounter (Signed)
Needs cpx 

## 2021-01-17 ENCOUNTER — Other Ambulatory Visit: Payer: Self-pay | Admitting: Adult Health

## 2021-01-17 ENCOUNTER — Other Ambulatory Visit: Payer: Self-pay

## 2021-01-17 DIAGNOSIS — F32A Depression, unspecified: Secondary | ICD-10-CM

## 2021-01-17 DIAGNOSIS — Z760 Encounter for issue of repeat prescription: Secondary | ICD-10-CM

## 2021-01-17 DIAGNOSIS — F419 Anxiety disorder, unspecified: Secondary | ICD-10-CM

## 2021-01-18 ENCOUNTER — Ambulatory Visit (INDEPENDENT_AMBULATORY_CARE_PROVIDER_SITE_OTHER): Payer: Medicare Other | Admitting: Adult Health

## 2021-01-18 VITALS — BP 112/60 | HR 71 | Temp 98.2°F | Resp 18 | Ht 63.0 in | Wt 122.0 lb

## 2021-01-18 DIAGNOSIS — E559 Vitamin D deficiency, unspecified: Secondary | ICD-10-CM

## 2021-01-18 DIAGNOSIS — R519 Headache, unspecified: Secondary | ICD-10-CM

## 2021-01-18 DIAGNOSIS — F419 Anxiety disorder, unspecified: Secondary | ICD-10-CM

## 2021-01-18 DIAGNOSIS — I10 Essential (primary) hypertension: Secondary | ICD-10-CM | POA: Diagnosis not present

## 2021-01-18 DIAGNOSIS — F32A Depression, unspecified: Secondary | ICD-10-CM | POA: Diagnosis not present

## 2021-01-18 DIAGNOSIS — K219 Gastro-esophageal reflux disease without esophagitis: Secondary | ICD-10-CM

## 2021-01-18 DIAGNOSIS — E782 Mixed hyperlipidemia: Secondary | ICD-10-CM | POA: Diagnosis not present

## 2021-01-18 LAB — VITAMIN D 25 HYDROXY (VIT D DEFICIENCY, FRACTURES): VITD: 52.96 ng/mL (ref 30.00–100.00)

## 2021-01-18 LAB — CBC WITH DIFFERENTIAL/PLATELET
Basophils Absolute: 0.1 10*3/uL (ref 0.0–0.1)
Basophils Relative: 0.9 % (ref 0.0–3.0)
Eosinophils Absolute: 0.1 10*3/uL (ref 0.0–0.7)
Eosinophils Relative: 1.7 % (ref 0.0–5.0)
HCT: 38.3 % (ref 36.0–46.0)
Hemoglobin: 12.7 g/dL (ref 12.0–15.0)
Lymphocytes Relative: 25 % (ref 12.0–46.0)
Lymphs Abs: 1.4 10*3/uL (ref 0.7–4.0)
MCHC: 33.1 g/dL (ref 30.0–36.0)
MCV: 95.6 fl (ref 78.0–100.0)
Monocytes Absolute: 0.6 10*3/uL (ref 0.1–1.0)
Monocytes Relative: 10.8 % (ref 3.0–12.0)
Neutro Abs: 3.5 10*3/uL (ref 1.4–7.7)
Neutrophils Relative %: 61.6 % (ref 43.0–77.0)
Platelets: 133 10*3/uL — ABNORMAL LOW (ref 150.0–400.0)
RBC: 4.01 Mil/uL (ref 3.87–5.11)
RDW: 14.4 % (ref 11.5–15.5)
WBC: 5.6 10*3/uL (ref 4.0–10.5)

## 2021-01-18 LAB — LIPID PANEL
Cholesterol: 240 mg/dL — ABNORMAL HIGH (ref 0–200)
HDL: 117.4 mg/dL (ref >39.00)
LDL Cholesterol: 108 mg/dL — ABNORMAL HIGH (ref 0–99)
NonHDL: 122.13
Total CHOL/HDL Ratio: 2
Triglycerides: 71 mg/dL (ref 0.0–149.0)
VLDL: 14.2 mg/dL (ref 0.0–40.0)

## 2021-01-18 LAB — COMPREHENSIVE METABOLIC PANEL
ALT: 13 U/L (ref 0–35)
AST: 19 U/L (ref 0–37)
Albumin: 4.3 g/dL (ref 3.5–5.2)
Alkaline Phosphatase: 58 U/L (ref 39–117)
BUN: 24 mg/dL — ABNORMAL HIGH (ref 6–23)
CO2: 30 mEq/L (ref 19–32)
Calcium: 9.4 mg/dL (ref 8.4–10.5)
Chloride: 103 mEq/L (ref 96–112)
Creatinine, Ser: 0.82 mg/dL (ref 0.40–1.20)
GFR: 65.59 mL/min (ref >60.00)
Glucose, Bld: 98 mg/dL (ref 70–99)
Potassium: 5 mEq/L (ref 3.5–5.1)
Sodium: 139 mEq/L (ref 135–145)
Total Bilirubin: 0.6 mg/dL (ref 0.2–1.2)
Total Protein: 6.7 g/dL (ref 6.0–8.3)

## 2021-01-18 LAB — TSH: TSH: 1.94 u[IU]/mL (ref 0.35–4.50)

## 2021-01-18 MED ORDER — ESCITALOPRAM OXALATE 20 MG PO TABS: 20.0000 mg | ORAL_TABLET | Freq: Every day | ORAL | 1 refills | Status: DC

## 2021-01-18 MED ORDER — BUTALBITAL-APAP-CAFFEINE 50-325-40 MG PO TABS: ORAL_TABLET | ORAL | 1 refills | Status: DC

## 2021-01-18 NOTE — Progress Notes (Signed)
Subjective:    Patient ID: Sheila Wolf, female    DOB: Jun 13, 1934, 87 y.o.   MRN: [id number]  HPI Patient presents for yearly preventative medicine examination. She is a pleasant 87 year old female who  has a past medical history of Anxiety and depression, Arthritis, Frequent headaches, GERD (gastroesophageal reflux disease), Hypertension, Osteoporosis, Thrombocytopathia ([place]), and Urinary incontinence.   She is in the process of moving to [facility]  Hypertension -currently prescribed Norvasc 5 mg and metoprolol 50 mg daily.  She denies dizziness, lightheadedness, chest pain, or shortness of breath BP Readings from Last 3 Encounters:  01/18/21 112/60  09/14/20 122/74  08/02/20 140/70   Anxiety and Depression -prescribed Lexapro 20 mg daily.  She does feel well controlled on this medication  GERD - takes Prilosec 20 mg daily   Hyperlipidemia - not currently on medication Lab Results  Component Value Date   CHOL 225 (H) 10/22/2019   HDL 114.70 10/22/2019   LDLCALC 97 10/22/2019   TRIG 64.0 10/22/2019   CHOLHDL 2 10/22/2019   Vitamin D deficiency-takes over-the-counter vitamin D supplement.  She is not sure of which dose she takes Last vitamin D Lab Results  Component Value Date   VD25OH 30.83 10/22/2019   Headaches -uses Fioricet as needed.  Has had headaches all of her life.  No changes in the quantity or quality of her headaches  Osteoarthritis-cervical spine, lumbar spine, and bilateral knees, worse in her left knee.  She does take Mobic 15 mg daily and has a prescription for tramadol that was given to her by orthopedics.  She plans on returning to see orthopedics due to constant left-sided knee pain.   All immunizations and health maintenance protocols were reviewed with the patient and needed orders were placed.  Appropriate screening laboratory values were ordered for the patient including screening of hyperlipidemia, renal function and hepatic  function.   Medication reconciliation,  past medical history, social history, problem list and allergies were reviewed in detail with the patient  Goals were established with regard to weight loss, exercise, and  diet in compliance with medications Wt Readings from Last 3 Encounters:  01/18/21 122 lb (55.3 kg)  09/14/20 131 lb (59.4 kg)  08/02/20 130 lb 3.2 oz (59.1 kg)    Review of Systems  Constitutional: Negative.   HENT: Negative.   Eyes: Negative.   Respiratory: Negative.   Cardiovascular: Negative.   Gastrointestinal: Negative.   Endocrine: Negative.   Genitourinary: Negative.   Musculoskeletal: Positive for arthralgias (left knee ).  Skin: Negative.   Allergic/Immunologic: Negative.   Neurological: Negative.   Hematological: Negative.   Psychiatric/Behavioral: Negative.    Past Medical History:  Diagnosis Date  . Anxiety and depression   . Arthritis   . Frequent headaches   . GERD (gastroesophageal reflux disease)   . Hypertension   . Osteoporosis   . Thrombocytopathia ([place])   . Urinary incontinence     Social History   Socioeconomic History  . Marital status: Widowed    Spouse name: Not on file  . Number of children: Not on file  . Years of education: Not on file  . Highest education level: Not on file  Occupational History  . Not on file  Tobacco Use  . Smoking status: Former Smoker    Years: 10.00    Types: Cigarettes  . Smokeless tobacco: Never Used  Vaping Use  . Vaping Use: Never used  Substance and Sexual Activity  . Alcohol  use: Yes    Alcohol/week: 10.0 standard drinks    Types: 10 Glasses of wine per week  . Drug use: No  . Sexual activity: Never  Other Topics Concern  . Not on file  Social History Narrative  . Not on file   Social Determinants of Health   Financial Resource Strain: Not on file  Food Insecurity: Not on file  Transportation Needs: Not on file  Physical Activity: Not on file  Stress: Not on file  Social  Connections: Not on file  Intimate Partner Violence: Not on file    Past Surgical History:  Procedure Laterality Date  . APPENDECTOMY    . BUNIONECTOMY    . CERVICAL SPINE SURGERY    . SHOULDER SURGERY    . TONSILLECTOMY AND ADENOIDECTOMY      Family History  Problem Relation Age of Onset  . Arthritis Mother   . Hearing loss Mother   . Heart disease Mother   . Hypertension Mother   . Alcohol abuse Father   . Early death Father   . Heart disease Father   . Stroke Father   . Birth defects Brother   . Heart disease Brother     Allergies  Allergen Reactions  . Penicillins Rash    Current Outpatient Medications on File Prior to Visit  Medication Sig Dispense Refill  . Apoaequorin (PREVAGEN) 10 MG CAPS Take 1 capsule by mouth daily.    . butalbital-acetaminophen-caffeine (FIORICET) 50-325-40 MG tablet TAKE 1 TABLET BY MOUTH 4 TIMES DAILY AS NEEDED FOR HEADACHE 30 tablet 1  . cetirizine (ZYRTEC) 10 MG tablet TAKE 1 TABLET BY MOUTH EVERY DAY 90 tablet 3  . cycloSPORINE (RESTASIS) 0.05 % ophthalmic emulsion 1 drop 2 (two) times daily.    Marland Kitchen escitalopram (LEXAPRO) 20 MG tablet TAKE 1 TABLET BY MOUTH EVERY DAY 30 tablet 0  . fluticasone (FLONASE) 50 MCG/ACT nasal spray Place 2 sprays into both nostrils daily. 16 g 6  . Melatonin 5 MG CAPS Take by mouth.    . meloxicam (MOBIC) 15 MG tablet TAKE 1 TABLET BY MOUTH EVERY DAY 90 tablet 1  . metoprolol succinate (TOPROL-XL) 50 MG 24 hr tablet TAKE 1 TABLET BY MOUTH EVERY DAY WITH OR IMMEDIATELY FOLLOWING A MEAL 90 tablet 3  . nystatin-triamcinolone ointment (MYCOLOG) Apply topically 2 (two) times daily.    Marland Kitchen omeprazole (PRILOSEC) 20 MG capsule TAKE 1 CAPSULE BY MOUTH EVERY DAY 90 capsule 0  . traMADol (ULTRAM) 50 MG tablet tramadol 50 mg tablet  Take 1 tablet every 6 hours by oral route.    Marland Kitchen amLODipine (NORVASC) 10 MG tablet TAKE 1 TABLET BY MOUTH EVERY DAY 90 tablet 1  . amLODipine (NORVASC) 5 MG tablet amlodipine 5 mg tablet  TAKE 2  TABLETS BY MOUTH EVERY DAY     No current facility-administered medications on file prior to visit.    BP 112/60 (BP Location: Left Arm, Patient Position: Sitting, Cuff Size: Normal)   Pulse 71   Temp 98.2 F (36.8 C) (Oral)   Resp 18   Ht 5\' 3"  (1.6 m)   Wt 122 lb (55.3 kg)   SpO2 97%   BMI 21.61 kg/m       Objective:   Physical Exam Vitals and nursing note reviewed.  Constitutional:      General: She is not in acute distress.    Appearance: Normal appearance. She is well-developed. She is not ill-appearing.  HENT:     Head: Normocephalic  and atraumatic.     Right Ear: Tympanic membrane, ear canal and external ear normal. There is no impacted cerumen.     Left Ear: Tympanic membrane, ear canal and external ear normal. There is no impacted cerumen.     Nose: Nose normal. No congestion or rhinorrhea.     Mouth/Throat:     Mouth: Mucous membranes are moist.     Pharynx: Oropharynx is clear. No oropharyngeal exudate or posterior oropharyngeal erythema.  Eyes:     General:        Right eye: No discharge.        Left eye: No discharge.     Extraocular Movements: Extraocular movements intact.     Conjunctiva/sclera: Conjunctivae normal.     Pupils: Pupils are equal, round, and reactive to light.  Neck:     Thyroid: No thyromegaly.     Vascular: No carotid bruit.     Trachea: No tracheal deviation.  Cardiovascular:     Rate and Rhythm: Normal rate and regular rhythm.     Pulses: Normal pulses.     Heart sounds: Normal heart sounds. No murmur heard. No friction rub. No gallop.   Pulmonary:     Effort: Pulmonary effort is normal. No respiratory distress.     Breath sounds: Normal breath sounds. No stridor. No wheezing, rhonchi or rales.  Chest:     Chest wall: No tenderness.  Abdominal:     General: Abdomen is flat. Bowel sounds are normal. There is no distension.     Palpations: Abdomen is soft. There is no mass.     Tenderness: There is no abdominal tenderness. There  is no right CVA tenderness, left CVA tenderness, guarding or rebound.     Hernia: No hernia is present.  Musculoskeletal:        General: No swelling, tenderness, deformity or signs of injury. Normal range of motion.     Cervical back: Normal range of motion and neck supple.     Right lower leg: No edema.     Left lower leg: No edema.  Lymphadenopathy:     Cervical: No cervical adenopathy.  Skin:    General: Skin is warm and dry.     Coloration: Skin is not jaundiced or pale.     Findings: No bruising, erythema, lesion or rash.  Neurological:     General: No focal deficit present.     Mental Status: She is alert and oriented to person, place, and time.     Cranial Nerves: No cranial nerve deficit.     Sensory: No sensory deficit.     Motor: No weakness.     Coordination: Coordination normal.     Gait: Gait normal.     Deep Tendon Reflexes: Reflexes normal.  Psychiatric:        Mood and Affect: Mood normal.        Behavior: Behavior normal.        Thought Content: Thought content normal.        Judgment: Judgment normal.        Assessment & Plan:  1. Essential hypertension - Controlled. No change  - Follow up in one year or sooner if needed - CBC with Differential/Platelet; Future - Comprehensive metabolic panel; Future - Lipid panel; Future - TSH; Future - Vitamin D, 25-hydroxy; Future - Vitamin D, 25-hydroxy - TSH - Lipid panel - Comprehensive metabolic panel - CBC with Differential/Platelet  2. Anxiety and depression - Controlled. No change  -  escitalopram (LEXAPRO) 20 MG tablet; Take 1 tablet (20 mg total) by mouth daily.  Dispense: 90 tablet; Refill: 1  3. Gastroesophageal reflux disease without esophagitis - Continue with Prilosec 20 mg daily  - CBC with Differential/Platelet; Future - Comprehensive metabolic panel; Future - Lipid panel; Future - TSH; Future - Vitamin D, 25-hydroxy; Future  - Vitamin D, 25-hydroxy - TSH - Lipid panel - Comprehensive  metabolic panel - CBC with Differential/Platelet  4. Mixed hyperlipidemia - Consider statin  - CBC with Differential/Platelet; Future - Comprehensive metabolic panel; Future - Lipid panel; Future - TSH; Future - Vitamin D, 25-hydroxy; Future - butalbital-acetaminophen-caffeine (FIORICET) 50-325-40 MG tablet; Take daily PRN  Dispense: 30 tablet; Refill: 1 - Vitamin D, 25-hydroxy - TSH - Lipid panel - Comprehensive metabolic panel - CBC with Differential/Platelet  5. Vitamin D deficiency  - Vitamin D, 25-hydroxy; Future - Vitamin D, 25-hydroxy  6. Frequent headaches  - butalbital-acetaminophen-caffeine (FIORICET) 50-325-40 MG tablet; Take daily PRN  Dispense: 30 tablet; Refill: 1  Dorothyann Peng, NP

## 2021-01-18 NOTE — Patient Instructions (Signed)
It was great seeing you today   You look great today!   We will follow up with you regarding your blood work   Best of luck with the movie

## 2021-01-19 ENCOUNTER — Other Ambulatory Visit: Payer: Self-pay | Admitting: Adult Health

## 2021-01-19 MED ORDER — SIMVASTATIN 10 MG PO TABS: 10.0000 mg | ORAL_TABLET | Freq: Every day | ORAL | 3 refills | Status: DC

## 2021-02-02 DIAGNOSIS — M1712 Unilateral primary osteoarthritis, left knee: Secondary | ICD-10-CM | POA: Diagnosis not present

## 2021-02-02 DIAGNOSIS — M25562 Pain in left knee: Secondary | ICD-10-CM | POA: Diagnosis not present

## 2021-02-03 ENCOUNTER — Other Ambulatory Visit: Payer: Self-pay | Admitting: Adult Health

## 2021-02-03 DIAGNOSIS — R519 Headache, unspecified: Secondary | ICD-10-CM

## 2021-02-14 ENCOUNTER — Other Ambulatory Visit: Payer: Self-pay | Admitting: Adult Health

## 2021-02-14 DIAGNOSIS — I10 Essential (primary) hypertension: Secondary | ICD-10-CM

## 2021-02-21 ENCOUNTER — Other Ambulatory Visit: Payer: Self-pay | Admitting: Adult Health

## 2021-02-21 DIAGNOSIS — R519 Headache, unspecified: Secondary | ICD-10-CM

## 2021-02-23 NOTE — Telephone Encounter (Signed)
Requesting: Fioricet Contract: none UDS: none Last Visit: 01/18/2021 Next Visit: none pending Last Refill: 01/18/2021

## 2021-02-26 ENCOUNTER — Other Ambulatory Visit: Payer: Self-pay

## 2021-02-26 ENCOUNTER — Telehealth (INDEPENDENT_AMBULATORY_CARE_PROVIDER_SITE_OTHER): Payer: Medicare Other | Admitting: Family Medicine

## 2021-02-26 ENCOUNTER — Encounter: Payer: Self-pay | Admitting: Family Medicine

## 2021-02-26 DIAGNOSIS — B349 Viral infection, unspecified: Secondary | ICD-10-CM

## 2021-02-26 DIAGNOSIS — Z20822 Contact with and (suspected) exposure to covid-19: Secondary | ICD-10-CM | POA: Diagnosis not present

## 2021-02-26 NOTE — Progress Notes (Signed)
Virtual Visit via Video Note  Subjective  CC:  Chief Complaint  Patient presents with  . Cough    Productive cough. All symptoms started on Thursday, denies any recent COVID test. Patient is morning into [facility] this weekend - daughter is concerned this may be anxiety instead of illness  . Headache  . Diarrhea  . Nausea   Evaluated today at the [facility]. New pt to me. Chart reviewed.  I connected with [name] on 02/26/21 at 10:30 AM EDT by a video enabled telemedicine application and verified that I am speaking with the correct person using two identifiers. Location patient: Home Location provider: [facility] [place], Office Persons participating in the virtual visit: Sheila Wolf, Leamon Arnt, MD Reymundo Poll CMA  I discussed the limitations of evaluation and management by telemedicine and the availability of in person appointments. The patient expressed understanding and agreed to proceed. HPI: Sheila Wolf is a 87 y.o. female who was contacted today to address the problems listed above in the chief complaint.  87 year old female with history of hypertension, anxiety and frequent headaches presents due to 2 to 3-day history of malaise, cough, nasal congestion and postnasal drip, sore throat and decreased appetite.  She is also had some loose bowel movements.  She denies nausea or vomiting.  No fevers.  No sinus pain.  No dental pain.  No shortness of breath.  She is fully vaccinated against COVID but has not yet had Covid testing done.  She is quite stressed and anxious.  She is supposed to move into an assisted living place this afternoon.  Her daughter has similar symptoms that started on the same day.   Assessment  1. Viral syndrome      Plan   Viral syndrome: Symptoms are most consistent with viral infection.  Education counseling given.  No signs or symptoms of serious  bacterial illness.  No antibiotics indicated at this time.  Patient did request antibiotics.  Recommend supportive care with Robitussin-DM, increase fluids, rest, Imodium if needed.  Recommend getting Covid testing prior to moving.  Recommend wearing a mask at her new facility.  If symptoms worsen or shortness of breath develop, needs office visit.  Patient understands and agrees with care plan. I discussed the assessment and treatment plan with the patient. The patient was provided an opportunity to ask questions and all were answered. The patient agreed with the plan and demonstrated an understanding of the instructions.   The patient was advised to call back or seek an in-person evaluation if the symptoms worsen or if the condition fails to improve as anticipated. Follow up: As needed Visit date not found  No orders of the defined types were placed in this encounter.     I reviewed the patients updated PMH, FH, and SocHx.    Patient Active Problem List   Diagnosis Date Noted  . Hypertension   . GERD (gastroesophageal reflux disease)   . Frequent headaches   . Anxiety and depression    Current Meds  Medication Sig  . amLODipine (NORVASC) 5 MG tablet amlodipine 5 mg tablet  TAKE 2 TABLETS BY MOUTH EVERY DAY  . Apoaequorin (PREVAGEN) 10 MG CAPS Take 1 capsule by mouth daily.  . butalbital-acetaminophen-caffeine (FIORICET) 50-325-40 MG tablet TAKE 1 TABLET BY MOUTH 4 TIMES DAILY AS NEEDED FOR HEADACHE  . cetirizine (ZYRTEC) 10 MG tablet TAKE 1 TABLET BY MOUTH EVERY  DAY  . cycloSPORINE (RESTASIS) 0.05 % ophthalmic emulsion 1 drop 2 (two) times daily.  Marland Kitchen escitalopram (LEXAPRO) 20 MG tablet Take 1 tablet (20 mg total) by mouth daily.  . fluticasone (FLONASE) 50 MCG/ACT nasal spray Place 2 sprays into both nostrils daily.  . Melatonin 5 MG CAPS Take by mouth.  . meloxicam (MOBIC) 15 MG tablet TAKE 1 TABLET BY MOUTH EVERY DAY  . metoprolol succinate (TOPROL-XL) 50 MG 24 hr tablet TAKE 1  TABLET BY MOUTH EVERY DAY WITH OR IMMEDIATELY FOLLOWING A MEAL  . nystatin-triamcinolone ointment (MYCOLOG) Apply topically 2 (two) times daily.  Marland Kitchen omeprazole (PRILOSEC) 20 MG capsule TAKE 1 CAPSULE BY MOUTH EVERY DAY  . simvastatin (ZOCOR) 10 MG tablet Take 1 tablet (10 mg total) by mouth at bedtime.  . traMADol (ULTRAM) 50 MG tablet tramadol 50 mg tablet  Take 1 tablet every 6 hours by oral route.    Allergies: Patient is allergic to penicillins. Family History: Patient family history includes Alcohol abuse in her father; Arthritis in her mother; Birth defects in her brother; Early death in her father; Hearing loss in her mother; Heart disease in her brother, father, and mother; Hypertension in her mother; Stroke in her father. Social History:  Patient  reports that she has quit smoking. Her smoking use included cigarettes. She quit after 10.00 years of use. She has never used smokeless tobacco. She reports current alcohol use of about 10.0 standard drinks of alcohol per week. She reports that she does not use drugs.  Review of Systems: Constitutional: Negative for fever malaise or anorexia Cardiovascular: negative for chest pain Respiratory: negative for SOB or persistent cough Gastrointestinal: negative for abdominal pain  OBJECTIVE Vitals: There were no vitals taken for this visit. General: no acute distress , A&Ox3, no respiratory distress HEENT: Nasal congestion present, mild coughing present. Respiratory: No increased work of breathing  Leamon Arnt, MD

## 2021-02-28 ENCOUNTER — Ambulatory Visit (INDEPENDENT_AMBULATORY_CARE_PROVIDER_SITE_OTHER): Payer: Medicare Other | Admitting: Family Medicine

## 2021-02-28 ENCOUNTER — Encounter: Payer: Self-pay | Admitting: Family Medicine

## 2021-02-28 ENCOUNTER — Other Ambulatory Visit: Payer: Self-pay

## 2021-02-28 VITALS — BP 140/70 | HR 72 | Temp 97.9°F | Wt 123.9 lb

## 2021-02-28 DIAGNOSIS — J069 Acute upper respiratory infection, unspecified: Secondary | ICD-10-CM

## 2021-02-28 NOTE — Patient Instructions (Signed)
Consider over the counter Mucinex twice daily  Plenty of fluids and rest  Follow up for any fever or increased shortness of breath.

## 2021-02-28 NOTE — Progress Notes (Signed)
Established Patient Office Visit  Subjective:  Patient ID: Sheila Wolf, female    DOB: [birth year]  Age: 87 y.o. MRN: [id number]  CC:  Chief Complaint  Patient presents with  . Cough    X 5 days, productive cough, sore throat, congestion    HPI Sheila Wolf presents for onset about 5 days ago of hoarseness, sore throat, cough, nasal congestion.  She states her sore throat is actually resolved at this time.  She states she feels "better "today.  Still spitting up some yellow mucus.  No fever.  No nausea or vomiting.  Taking over-the-counter chlorpheniramine/dextromethorphan product.  No dyspnea.  She took a home Covid test which was negative.  Past Medical History:  Diagnosis Date  . Anxiety and depression   . Arthritis   . Frequent headaches   . GERD (gastroesophageal reflux disease)   . Hypertension   . Osteoporosis   . 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 Urinary incontinence     Past Surgical History:  Procedure Laterality Date  . APPENDECTOMY    . BUNIONECTOMY    . CERVICAL SPINE SURGERY    . SHOULDER SURGERY    . TONSILLECTOMY AND ADENOIDECTOMY      Family History  Problem Relation Age of Onset  . Arthritis Mother   . Hearing loss Mother   . Heart disease Mother   . Hypertension Mother   . Alcohol abuse Father   . Early death Father   . Heart disease Father   . Stroke Father   . Birth defects Brother   . Heart disease Brother     Social History   Socioeconomic History  . Marital status: Widowed    Spouse name: Not on file  . Number of children: Not on file  . Years of education: Not on file  . Highest education level: Not on file  Occupational History  . Not on file  Tobacco Use  . Smoking status: Former Smoker    Years: 10.00    Types: Cigarettes  . Smokeless tobacco: Never Used  Vaping Use  . Vaping Use: Never used  Substance and Sexual Activity  . Alcohol use: Yes    Alcohol/week: 10.0 standard drinks    Types: 10  Glasses of wine per week  . Drug use: No  . Sexual activity: Never  Other Topics Concern  . Not on file  Social History Narrative  . Not on file   Social Determinants of Health   Financial Resource Strain: Not on file  Food Insecurity: Not on file  Transportation Needs: Not on file  Physical Activity: Not on file  Stress: Not on file  Social Connections: Not on file  Intimate Partner Violence: Not on file    Outpatient Medications Prior to Visit  Medication Sig Dispense Refill  . amLODipine (NORVASC) 5 MG tablet amlodipine 5 mg tablet  TAKE 2 TABLETS BY MOUTH EVERY DAY    . Apoaequorin (PREVAGEN) 10 MG CAPS Take 1 capsule by mouth daily.    . butalbital-acetaminophen-caffeine (FIORICET) 50-325-40 MG tablet TAKE 1 TABLET BY MOUTH 4 TIMES DAILY AS NEEDED FOR HEADACHE 30 tablet 1  . cetirizine (ZYRTEC) 10 MG tablet TAKE 1 TABLET BY MOUTH EVERY DAY 90 tablet 3  . cycloSPORINE (RESTASIS) 0.05 % ophthalmic emulsion 1 drop 2 (two) times daily.    Marland Kitchen escitalopram (LEXAPRO) 20 MG tablet Take 1 tablet (20 mg total) by mouth daily. 90 tablet 1  . fluticasone (FLONASE) 50 MCG/ACT nasal  spray Place 2 sprays into both nostrils daily. 16 g 6  . Melatonin 5 MG CAPS Take by mouth.    . meloxicam (MOBIC) 15 MG tablet TAKE 1 TABLET BY MOUTH EVERY DAY 90 tablet 1  . metoprolol succinate (TOPROL-XL) 50 MG 24 hr tablet TAKE 1 TABLET BY MOUTH EVERY DAY WITH OR IMMEDIATELY FOLLOWING A MEAL 90 tablet 3  . nystatin-triamcinolone ointment (MYCOLOG) Apply topically 2 (two) times daily.    Marland Kitchen omeprazole (PRILOSEC) 20 MG capsule TAKE 1 CAPSULE BY MOUTH EVERY DAY 90 capsule 0  . simvastatin (ZOCOR) 10 MG tablet Take 1 tablet (10 mg total) by mouth at bedtime. 90 tablet 3  . traMADol (ULTRAM) 50 MG tablet tramadol 50 mg tablet  Take 1 tablet every 6 hours by oral route.     No facility-administered medications prior to visit.    Allergies  Allergen Reactions  . Penicillins Rash    ROS Review of Systems   Constitutional: Negative for chills and fever.  HENT: Positive for congestion and voice change. Negative for facial swelling, sinus pressure, sinus pain and trouble swallowing.   Respiratory: Positive for cough. Negative for shortness of breath and wheezing.   Cardiovascular: Negative for chest pain and leg swelling.      Objective:    Physical Exam Vitals reviewed.  Constitutional:      Appearance: Normal appearance.  HENT:     Right Ear: Ear canal normal.     Left Ear: Ear canal normal.     Mouth/Throat:     Mouth: Mucous membranes are moist.     Pharynx: Oropharynx is clear. No posterior oropharyngeal erythema.  Cardiovascular:     Rate and Rhythm: Normal rate and regular rhythm.  Pulmonary:     Effort: Pulmonary effort is normal.     Breath sounds: Normal breath sounds. No wheezing or rales.  Musculoskeletal:     Cervical back: Neck supple.  Lymphadenopathy:     Cervical: No cervical adenopathy.  Neurological:     Mental Status: She is alert.     BP 140/70 (BP Location: Left Arm, Patient Position: Sitting, Cuff Size: Normal)   Pulse 72   Temp 97.9 F (36.6 C) (Oral)   Wt 123 lb 14.4 oz (56.2 kg)   SpO2 97%   BMI 21.95 kg/m  Wt Readings from Last 3 Encounters:  02/28/21 123 lb 14.4 oz (56.2 kg)  01/18/21 122 lb (55.3 kg)  09/14/20 131 lb (59.4 kg)     There are no preventive care reminders to display for this patient.  There are no preventive care reminders to display for this patient.  Lab Results  Component Value Date   TSH 1.94 01/18/2021   Lab Results  Component Value Date   WBC 5.6 01/18/2021   HGB 12.7 01/18/2021   HCT 38.3 01/18/2021   MCV 95.6 01/18/2021   PLT 133.0 (L) 01/18/2021   Lab Results  Component Value Date   NA 139 01/18/2021   K 5.0 01/18/2021   CO2 30 01/18/2021   GLUCOSE 98 01/18/2021   BUN 24 (H) 01/18/2021   CREATININE 0.82 01/18/2021   BILITOT 0.6 01/18/2021   ALKPHOS 58 01/18/2021   AST 19 01/18/2021   ALT 13  01/18/2021   PROT 6.7 01/18/2021   ALBUMIN 4.3 01/18/2021   CALCIUM 9.4 01/18/2021   ANIONGAP 10 08/13/2018   GFR 65.59 01/18/2021   Lab Results  Component Value Date   CHOL 240 (H) 01/18/2021   Lab  Results  Component Value Date   HDL 117.40 01/18/2021   Lab Results  Component Value Date   LDLCALC 108 (H) 01/18/2021   Lab Results  Component Value Date   TRIG 71.0 01/18/2021   Lab Results  Component Value Date   CHOLHDL 2 01/18/2021   No results found for: HGBA1C    Assessment & Plan:    Probable viral URI with cough.  Patient afebrile.  Home Covid test negative.  States that she has some improved today compared to the weekend.  Sore throat symptoms have resolved.  Patient in no respiratory distress.  -Recommend treat symptomatically and stay well-hydrated and plenty of fluids. -Consider over-the-counter plain Mucinex twice daily -Follow-up immediately for any fever or any worsening or persistent symptoms  No orders of the defined types were placed in this encounter.   Follow-up: No follow-ups on file.    Carolann Littler, MD

## 2021-03-25 ENCOUNTER — Telehealth: Payer: Self-pay

## 2021-03-25 ENCOUNTER — Ambulatory Visit: Payer: Medicare Other

## 2021-03-25 NOTE — Telephone Encounter (Cosign Needed)
attemptted video visit x 10 min,  no one showed called pt twice no answer unable to leave a message may reschedule for next aviable iMWV with nurse health advisor  L.Yoshie Kosel,LPN

## 2021-03-31 ENCOUNTER — Other Ambulatory Visit: Payer: Self-pay | Admitting: Adult Health

## 2021-03-31 DIAGNOSIS — R519 Headache, unspecified: Secondary | ICD-10-CM

## 2021-04-04 ENCOUNTER — Telehealth: Payer: Self-pay | Admitting: Adult Health

## 2021-04-04 NOTE — Telephone Encounter (Signed)
Please advise 

## 2021-04-04 NOTE — Telephone Encounter (Signed)
Patient is calling and wanted to see if provide recommends her get the 4th booster vaccine, please advise. CB is [phone]

## 2021-04-05 DIAGNOSIS — Z23 Encounter for immunization: Secondary | ICD-10-CM | POA: Diagnosis not present

## 2021-04-05 NOTE — Telephone Encounter (Signed)
I left patient a voicemail with the info below & advised her to call back with any questions.

## 2021-04-25 ENCOUNTER — Other Ambulatory Visit: Payer: Self-pay | Admitting: Adult Health

## 2021-04-25 DIAGNOSIS — I10 Essential (primary) hypertension: Secondary | ICD-10-CM

## 2021-04-27 ENCOUNTER — Telehealth: Payer: Self-pay | Admitting: Adult Health

## 2021-04-27 ENCOUNTER — Ambulatory Visit: Payer: Medicare Other

## 2021-04-27 ENCOUNTER — Other Ambulatory Visit: Payer: Self-pay

## 2021-04-27 NOTE — Telephone Encounter (Signed)
Patient states that she needs a refill of butalbital-acetaminophen-caffeine (FIORICET) 50-325-40 MG tablet .  Please advise.

## 2021-04-27 NOTE — Telephone Encounter (Signed)
This has been taking care of. Called pt to make sure Rx was received but no answer. Will call again shortly.

## 2021-04-29 NOTE — Telephone Encounter (Signed)
Called pt again no answer. Closing note. 

## 2021-05-06 ENCOUNTER — Telehealth: Payer: Self-pay | Admitting: Adult Health

## 2021-05-06 NOTE — Telephone Encounter (Signed)
Left message to return phone call.

## 2021-05-06 NOTE — Telephone Encounter (Signed)
Noted! Thank you

## 2021-05-06 NOTE — Telephone Encounter (Signed)
Baker Janus with [facility] [phone] would like to see if patient can get an order for Speech Therapy due to memory and cognitive issues.  Please call [phone]  Fax number [phone]

## 2021-05-06 NOTE — Telephone Encounter (Signed)
Ok for orders in the absence of [place]?

## 2021-05-06 NOTE — Telephone Encounter (Signed)
I will leave this for [facility] when he gets back

## 2021-05-09 NOTE — Telephone Encounter (Signed)
Spoke to gail and advised that I will let [name] knows upon his return. Baker Janus verbalized understanding.

## 2021-05-17 NOTE — Telephone Encounter (Signed)
Okay for verbal orders? Please advise 

## 2021-05-18 NOTE — Telephone Encounter (Signed)
Tried to give Sheila Wolf V.O but her phone keeps cutting off. Will try again later.

## 2021-05-20 NOTE — Telephone Encounter (Signed)
Verbal orders given to [facility].

## 2021-05-20 NOTE — Telephone Encounter (Signed)
Left message to return phone call.

## 2021-05-20 NOTE — Telephone Encounter (Signed)
Called Baker Janus again no answer.

## 2021-05-25 ENCOUNTER — Other Ambulatory Visit: Payer: Self-pay | Admitting: Adult Health

## 2021-05-25 DIAGNOSIS — R519 Headache, unspecified: Secondary | ICD-10-CM

## 2021-05-25 NOTE — Telephone Encounter (Signed)
Okay for refill?    LOV 01/18/2021   Last Refill    30   QTY.       1  Refills

## 2021-05-26 ENCOUNTER — Other Ambulatory Visit: Payer: Self-pay

## 2021-05-27 ENCOUNTER — Encounter: Payer: Self-pay | Admitting: Adult Health

## 2021-05-27 ENCOUNTER — Ambulatory Visit (INDEPENDENT_AMBULATORY_CARE_PROVIDER_SITE_OTHER): Payer: Medicare Other | Admitting: Adult Health

## 2021-05-27 VITALS — BP 130/70 | HR 55 | Temp 97.2°F | Ht 63.0 in | Wt 126.0 lb

## 2021-05-27 DIAGNOSIS — I10 Essential (primary) hypertension: Secondary | ICD-10-CM | POA: Diagnosis not present

## 2021-05-27 DIAGNOSIS — R300 Dysuria: Secondary | ICD-10-CM | POA: Diagnosis not present

## 2021-05-27 DIAGNOSIS — Z760 Encounter for issue of repeat prescription: Secondary | ICD-10-CM | POA: Diagnosis not present

## 2021-05-27 LAB — URINALYSIS, ROUTINE W REFLEX MICROSCOPIC
Bilirubin Urine: NEGATIVE
Hgb urine dipstick: NEGATIVE
Ketones, ur: NEGATIVE
Nitrite: NEGATIVE
Specific Gravity, Urine: >=1.03 — AB (ref 1.000–1.030)
Total Protein, Urine: NEGATIVE
Urine Glucose: NEGATIVE
Urobilinogen, UA: 0.2 (ref 0.0–1.0)
pH: 5.5 (ref 5.0–8.0)

## 2021-05-27 MED ORDER — NYSTATIN-TRIAMCINOLONE 100000-0.1 UNIT/GM-% EX OINT: TOPICAL_OINTMENT | Freq: Two times a day (BID) | CUTANEOUS | 1 refills | Status: DC

## 2021-05-27 MED ORDER — AMLODIPINE BESYLATE 5 MG PO TABS: 5.0000 mg | ORAL_TABLET | Freq: Every day | ORAL | 3 refills | Status: DC

## 2021-05-27 NOTE — Progress Notes (Signed)
Subjective:    Patient ID: Sheila Wolf, female    DOB: May 15, 1934, 87 y.o.   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  [facility], is really enjoyed living there and has been able to meet some new people and be more social than she was prior to moving.  She presents to the office today for follow up regarding hypertension.  She is currently prescribed Norvasc 5 mg daily and Toprol 50 mg daily.  She denies dizziness, lightheadedness, chest pain, or shortness of breath.  She does not monitor her blood pressures at home.  She needs a refill of Norvasc  BP Readings from Last 3 Encounters:  05/27/21 130/70  02/28/21 140/70  01/18/21 112/60   Additionally, she reports that she has had increased urinary incontinence, mild dysuria and urgency,  and a" little irritation down there".  Symptoms have been present for the last few weeks.  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[place])    Urinary incontinence     Social History   Socioeconomic History   Marital status: Widowed    Spouse name: Not on file   Number of children: Not on file   Years of education: Not on file   Highest education level: Not on file  Occupational History   Not on file  Tobacco Use   Smoking status: Former    Years: 10.00    Pack years: 0.00    Types: Cigarettes   Smokeless tobacco: Never  Vaping Use   Vaping Use: Never used  Substance and Sexual Activity   Alcohol use: Yes    Alcohol/week: 10.0 standard drinks     Types: 10 Glasses of wine per week   Drug use: No   Sexual activity: Never  Other Topics Concern   Not on file  Social History Narrative   Not on file   Social Determinants of Health   Financial Resource Strain: Not on file  Food Insecurity: Not on file  Transportation Needs: Not on file  Physical Activity: Not on file  Stress: Not on file  Social Connections: Not on file  Intimate Partner Violence: Not on file    Past Surgical History:  Procedure Laterality Date   APPENDECTOMY     BUNIONECTOMY     [place]      Family History  Problem Relation Age of Onset   Arthritis Mother    Hearing loss Mother    Heart disease Mother    Hypertension Mother    Alcohol abuse Father    Early death Father    Heart disease Father    Stroke Father    Birth defects Brother    Heart disease Brother     Allergies  Allergen Reactions   Penicillins Rash    Current Outpatient Medications on File Prior to Visit  Medication Sig Dispense Refill   butalbital-acetaminophen-caffeine (FIORICET) 50-325-40 MG tablet TAKE 1 TABLET BY MOUTH 4  TIMES DAILY AS NEEDED FOR HEADACHE 30 tablet 1   cetirizine (ZYRTEC) 10 MG tablet TAKE 1 TABLET BY MOUTH EVERY DAY 90 tablet 3   cycloSPORINE (RESTASIS) 0.05 % ophthalmic emulsion 1 drop 2 (two) times daily.     escitalopram (LEXAPRO) 20 MG tablet Take 1 tablet (20 mg total) by mouth daily. 90 tablet 1   fluticasone (FLONASE) 50 MCG/ACT nasal spray Place 2 sprays into both nostrils daily. 16 g 6   Melatonin 5 MG CAPS Take by mouth.     meloxicam (MOBIC) 15 MG tablet TAKE 1 TABLET BY MOUTH EVERY DAY 90 tablet 1   metoprolol succinate (TOPROL-XL) 50 MG 24 hr tablet TAKE 1 TABLET BY MOUTH EVERY DAY WITH OR IMMEDIATELY FOLLOWING A MEAL 90 tablet 3   omeprazole (PRILOSEC) 20 MG capsule TAKE 1 CAPSULE BY MOUTH EVERY DAY 90 capsule 0   simvastatin (ZOCOR) 10 MG tablet Take 1 tablet (10 mg  total) by mouth at bedtime. 90 tablet 3   traMADol (ULTRAM) 50 MG tablet tramadol 50 mg tablet  Take 1 tablet every 6 hours by oral route.     No current facility-administered medications on file prior to visit.    BP 130/70   Pulse (!) 55   Temp (!) 97.2 F (36.2 C) (Oral)   Ht 5\' 3"  (1.6 m)   Wt 126 lb (57.2 kg)   SpO2 98%   BMI 22.32 kg/m       Objective:   Physical Exam Vitals and nursing note reviewed.  Constitutional:      Appearance: Normal appearance.  Cardiovascular:     Rate and Rhythm: Normal rate and regular rhythm.  Skin:    General: Skin is warm and dry.     Capillary Refill: Capillary refill takes less than 2 seconds.  Neurological:     General: No focal deficit present.     Mental Status: She is alert and oriented to person, place, and time.  Psychiatric:        Mood and Affect: Mood normal.        Behavior: Behavior normal.        Thought Content: Thought content normal.        Judgment: Judgment normal.      Assessment & Plan:  1. Dysuria - will check urinalysis and culture - Culture, Urine; Future - Urinalysis; Future - Urinalysis - Culture, Urine  2. Essential hypertension - well controlled. No changes in medications - amLODipine (NORVASC) 5 MG tablet; Take 1 tablet (5 mg total) by mouth daily.  Dispense: 90 tablet; Refill: 3  3. Medication refill  - amLODipine (NORVASC) 5 MG tablet; Take 1 tablet (5 mg total) by mouth daily.  Dispense: 90 tablet; Refill: 3 - nystatin-triamcinolone ointment (MYCOLOG); Apply topically 2 (two) times daily.  Dispense: 30 g; Refill: 1  Dorothyann Peng, NP

## 2021-05-28 LAB — URINE CULTURE
MICRO NUMBER:: 12097240
SPECIMEN QUALITY:: ADEQUATE

## 2021-05-31 NOTE — Telephone Encounter (Signed)
OK for refill?   Please advise

## 2021-06-14 ENCOUNTER — Telehealth (INDEPENDENT_AMBULATORY_CARE_PROVIDER_SITE_OTHER): Payer: Medicare Other | Admitting: Adult Health

## 2021-06-14 ENCOUNTER — Encounter: Payer: Self-pay | Admitting: Adult Health

## 2021-06-14 VITALS — Ht 63.0 in | Wt 126.0 lb

## 2021-06-14 DIAGNOSIS — R413 Other amnesia: Secondary | ICD-10-CM

## 2021-06-14 NOTE — Progress Notes (Signed)
Virtual Visit via Video Note  I connected with Sheila Wolf on 06/14/21 at  5:00 PM EDT by a video enabled telemedicine application and verified that I am speaking with the correct person using two identifiers.  Location patient: home Location provider:work or home office Persons participating in the virtual visit: patient, provider  I discussed the limitations of evaluation and management by telemedicine and the availability of in person appointments. The patient expressed understanding and agreed to proceed.   HPI: 87 year old female who is being evaluated today medication management.  Sounds like speech therapist her independent living facility suggested that she start Aricept or Namenda for memory loss.  She would like to know when I think about these medications.  She does have known cognitive impairment that is more age-related.  She continues to perform all of her activities of daily living, drives extended distances, manages her own finances, appointments, and medications without difficulty.  She does become forgetful at times but knows her limitations.   ROS: See pertinent positives and negatives per HPI.  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[place])    Urinary incontinence     Past Surgical History:  Procedure Laterality Date   APPENDECTOMY     BUNIONECTOMY     CERVICAL SPINE SURGERY     SHOULDER SURGERY     TONSILLECTOMY AND ADENOIDECTOMY      Family History  Problem Relation Age of Onset   Arthritis Mother    Hearing loss Mother    Heart disease Mother    Hypertension Mother    Alcohol abuse Father    Early death Father    Heart disease Father    Stroke Father    Birth defects Brother    Heart disease Brother        Current Outpatient Medications:    amLODipine (NORVASC) 5 MG tablet, Take 1  tablet (5 mg total) by mouth daily., Disp: 90 tablet, Rfl: 3   butalbital-acetaminophen-caffeine (FIORICET) 50-325-40 MG tablet, TAKE 1 TABLET BY MOUTH 4 TIMES DAILY AS NEEDED FOR HEADACHE, Disp: 60 tablet, Rfl: 1   cetirizine (ZYRTEC) 10 MG tablet, TAKE 1 TABLET BY MOUTH EVERY DAY, Disp: 90 tablet, Rfl: 3   cycloSPORINE (RESTASIS) 0.05 % ophthalmic emulsion, 1 drop 2 (two) times daily., Disp: , Rfl:    escitalopram (LEXAPRO) 20 MG tablet, Take 1 tablet (20 mg total) by mouth daily., Disp: 90 tablet, Rfl: 1   fluticasone (FLONASE) 50 MCG/ACT nasal spray, Place 2 sprays into both nostrils daily., Disp: 16 g, Rfl: 6   Melatonin 5 MG CAPS, Take by mouth., Disp: , Rfl:    meloxicam (MOBIC) 15 MG tablet, TAKE 1 TABLET BY MOUTH EVERY DAY, Disp: 90 tablet, Rfl: 1   metoprolol succinate (TOPROL-XL) 50 MG 24 hr tablet, TAKE 1 TABLET BY MOUTH EVERY DAY WITH OR IMMEDIATELY FOLLOWING A MEAL, Disp: 90 tablet, Rfl: 3   nystatin-triamcinolone ointment (MYCOLOG), Apply topically 2 (two) times daily., Disp: 30 g, Rfl: 1   omeprazole (PRILOSEC) 20 MG capsule, TAKE 1 CAPSULE BY MOUTH EVERY DAY, Disp: 90 capsule, Rfl: 0   simvastatin (ZOCOR) 10 MG tablet, Take 1 tablet (10 mg total) by mouth at bedtime., Disp: 90 tablet, Rfl: 3   traMADol (ULTRAM) 50 MG tablet, tramadol 50 mg tablet  Take 1 tablet  every 6 hours by oral route., Disp: , Rfl:   EXAM:  VITALS per patient if applicable:  GENERAL: alert, oriented, appears well and in no acute distress  HEENT: atraumatic, conjunttiva clear, no obvious abnormalities on inspection of external nose and ears  NECK: normal movements of the head and neck  LUNGS: on inspection no signs of respiratory distress, breathing rate appears normal, no obvious gross SOB, gasping or wheezing  CV: no obvious cyanosis  MS: moves all visible extremities without noticeable abnormality  PSYCH/NEURO: pleasant and cooperative, no obvious depression or anxiety, speech and thought  processing grossly intact  ASSESSMENT AND PLAN:  Discussed the following assessment and plan: 1. Memory loss -Discussed both of these medications in detail.  Does not seem to have a type of dementia that would warrant starting Aricept or Namenda.  She does not necessarily want to start any more medications at this time.  We did talk about neuropsychiatry for testing but she does not want to do that currently.      I discussed the assessment and treatment plan with the patient. The patient was provided an opportunity to ask questions and all were answered. The patient agreed with the plan and demonstrated an understanding of the instructions.   The patient was advised to call back or seek an in-person evaluation if the symptoms worsen or if the condition fails to improve as anticipated.   Dorothyann Peng, NP

## 2021-06-15 ENCOUNTER — Ambulatory Visit: Payer: Medicare Other | Admitting: Adult Health

## 2021-06-17 ENCOUNTER — Ambulatory Visit: Payer: Medicare Other | Admitting: Adult Health

## 2021-06-21 ENCOUNTER — Ambulatory Visit (INDEPENDENT_AMBULATORY_CARE_PROVIDER_SITE_OTHER): Payer: Medicare Other

## 2021-06-21 DIAGNOSIS — Z0000 Encounter for general adult medical examination without abnormal findings: Secondary | ICD-10-CM | POA: Diagnosis not present

## 2021-06-21 NOTE — Progress Notes (Signed)
Subjective:   Sheila Wolf is a 87 y.o. female who presents for an Initial Medicare Annual Wellness Visit.  I connected with Meredith Leeds today by telephone and verified that I am speaking with the correct person using two identifiers. Location patient: home Location provider: work Persons participating in the virtual visit: patient, provider.   I discussed the limitations, risks, security and privacy concerns of performing an evaluation and management service by telephone and the availability of in person appointments. I also discussed with the patient that there may be a patient responsible charge related to this service. The patient expressed understanding and verbally consented to this telephonic visit.    Interactive audio and video telecommunications were attempted between this provider and patient, however failed, due to patient having technical difficulties OR patient did not have access to video capability.  We continued and completed visit with audio only.    Review of Systems    N/a       Objective:    There were no vitals filed for this visit. There is no height or weight on file to calculate BMI.  Advanced Directives 08/13/2018 08/13/2018  Does Patient Have a Medical Advance Directive? No No  Would patient like information on creating a medical advance directive? - No - Patient declined    Current Medications (verified) Outpatient Encounter Medications as of 06/21/2021  Medication Sig   amLODipine (NORVASC) 5 MG tablet Take 1 tablet (5 mg total) by mouth daily.   butalbital-acetaminophen-caffeine (FIORICET) 50-325-40 MG tablet TAKE 1 TABLET BY MOUTH 4 TIMES DAILY AS NEEDED FOR HEADACHE   cetirizine (ZYRTEC) 10 MG tablet TAKE 1 TABLET BY MOUTH EVERY DAY   cycloSPORINE (RESTASIS) 0.05 % ophthalmic emulsion 1 drop 2 (two) times daily.   escitalopram (LEXAPRO) 20 MG tablet Take 1 tablet (20 mg total) by mouth daily.   fluticasone (FLONASE) 50 MCG/ACT  nasal spray Place 2 sprays into both nostrils daily.   Melatonin 5 MG CAPS Take by mouth.   meloxicam (MOBIC) 15 MG tablet TAKE 1 TABLET BY MOUTH EVERY DAY   metoprolol succinate (TOPROL-XL) 50 MG 24 hr tablet TAKE 1 TABLET BY MOUTH EVERY DAY WITH OR IMMEDIATELY FOLLOWING A MEAL   nystatin-triamcinolone ointment (MYCOLOG) Apply topically 2 (two) times daily.   omeprazole (PRILOSEC) 20 MG capsule TAKE 1 CAPSULE BY MOUTH EVERY DAY   simvastatin (ZOCOR) 10 MG tablet Take 1 tablet (10 mg total) by mouth at bedtime.   traMADol (ULTRAM) 50 MG tablet tramadol 50 mg tablet  Take 1 tablet every 6 hours by oral route.   No facility-administered encounter medications on file as of 06/21/2021.    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[place])    Urinary incontinence    Past Surgical History:  Procedure Laterality Date   APPENDECTOMY     BUNIONECTOMY     CERVICAL SPINE SURGERY     SHOULDER SURGERY     TONSILLECTOMY AND ADENOIDECTOMY     Family History  Problem Relation Age of Onset   Arthritis Mother    Hearing loss Mother    Heart disease Mother    Hypertension Mother    Alcohol abuse Father    Early death Father    Heart disease Father  Stroke Father    Birth defects Brother    Heart disease Brother    Social History   Socioeconomic History   Marital status: Widowed    Spouse name: Not on file   Number of children: Not on file   Years of education: Not on file   Highest education level: Not on file  Occupational History   Not on file  Tobacco Use   Smoking status: Former    Years: 10.00    Types: Cigarettes   Smokeless tobacco: Never  Vaping Use   Vaping Use: Never used  Substance and Sexual Activity   Alcohol use: Yes    Alcohol/week: 10.0 standard drinks    Types:  10 Glasses of wine per week   Drug use: No   Sexual activity: Never  Other Topics Concern   Not on file  Social History Narrative   Not on file   Social Determinants of Health   Financial Resource Strain: Not on file  Food Insecurity: Not on file  Transportation Needs: Not on file  Physical Activity: Not on file  Stress: Not on file  Social Connections: Not on file    Tobacco Counseling Counseling given: Not Answered   Clinical Intake:                 Diabetic?no         Activities of Daily Living No flowsheet data found.  Patient Care Team: Dorothyann Peng, NP as PCP - General (Family Medicine) Paralee Cancel, MD as Consulting Physician (Orthopedic Surgery)  Indicate any recent Medical Services you may have received from other than [facility] providers in the past year (date may be approximate).     Assessment:   This is a routine wellness examination for [place].  Hearing/Vision screen No results found.  Dietary issues and exercise activities discussed:     Goals Addressed   None    Depression Screen PHQ 2/9 Scores 09/14/2020  PHQ - 2 Score 0  PHQ- 9 Score 0    Fall Risk Fall Risk  09/14/2020 10/15/2019  Falls in the past year? 1 1  Comment - Emmi Telephone Survey: data to providers prior to load  Number falls in past yr: 1 1  Comment - Emmi Telephone Survey Actual Response = 2  Injury with Fall? 1 1  Risk for fall due to : Impaired balance/gait -    FALL RISK PREVENTION PERTAINING TO THE HOME:  Any stairs in or around the home? No  If so, are there any without handrails? No  Home free of loose throw rugs in walkways, pet beds, electrical cords, etc? Yes  Adequate lighting in your home to reduce risk of falls? Yes   ASSISTIVE DEVICES UTILIZED TO PREVENT FALLS:  Life alert? No  Use of a cane, walker or w/c? No  Grab bars in the bathroom? Yes  Shower chair or bench in shower? Yes  Elevated toilet seat or a handicapped toilet? No     Cognitive Function:    Normal cognitive status assessed by direct observation by this Nurse Health Advisor. No abnormalities found.      Immunizations Immunization History  Administered Date(s) Administered   Fluad Quad(high Dose 65+) 08/23/2019, 08/02/2020   Influenza, High Dose Seasonal PF 09/04/2017, 08/23/2018   PFIZER(Purple Top)SARS-COV-2 Vaccination 12/11/2019, 01/01/2020, 08/25/2020   Pneumococcal Conjugate-13 07/29/2014, 11/30/2018   Pneumococcal Polysaccharide-23 03/20/2012, 11/29/2018   Zoster Recombinat (Shingrix) 10/06/2017, 01/07/2018    TDAP status: Due, Education has been provided  regarding the importance of this vaccine. Advised may receive this vaccine at local pharmacy or Health Dept. Aware to provide a copy of the vaccination record if obtained from local pharmacy or Health Dept. Verbalized acceptance and understanding.  Flu Vaccine status: Up to date  Pneumococcal vaccine status: Up to date  Covid-19 vaccine status: Completed vaccines  Qualifies for Shingles Vaccine? Yes   Zostavax completed No   Shingrix Completed?: No.    Education has been provided regarding the importance of this vaccine. Patient has been advised to call insurance company to determine out of pocket expense if they have not yet received this vaccine. Advised may also receive vaccine at local pharmacy or Health Dept. Verbalized acceptance and understanding.  Screening Tests Health Maintenance  Topic Date Due   COVID-19 Vaccine (4 - Booster for Pfizer series) 12/26/2020   INFLUENZA VACCINE  06/20/2021   DEXA SCAN  Completed   PNA vac Low Risk Adult  Completed   Zoster Vaccines- Shingrix  Completed   HPV VACCINES  Aged Out    Health Maintenance  Health Maintenance Due  Topic Date Due   COVID-19 Vaccine (4 - Booster for Pfizer series) 12/26/2020   INFLUENZA VACCINE  06/20/2021    Colorectal cancer screening: No longer required.   Mammogram status: No longer required due to  age.  Bone Density status: Completed 11/03/2019. Results reflect: Bone density results: OSTEOPENIA. Repeat every 5 years.  Lung Cancer Screening: (Low Dose CT Chest recommended if Age 12-80 years, 30 pack-year currently smoking OR have quit w/in 15years.) does not qualify.   Lung Cancer Screening Referral: n/a  Additional Screening:  Hepatitis C Screening: does not qualify;   Vision Screening: Recommended annual ophthalmology exams for early detection of glaucoma and other disorders of the eye. Is the patient up to date with their annual eye exam?  Yes  Who is the provider or what is the name of the office in which the patient attends annual eye exams? Dr.Lyles  If pt is not established with a provider, would they like to be referred to a provider to establish care? No .   Dental Screening: Recommended annual dental exams for proper oral hygiene  Community Resource Referral / Chronic Care Management: CRR required this visit?  No   CCM required this visit?  No      Plan:     I have personally reviewed and noted the following in the patient's chart:   Medical and social history Use of alcohol, tobacco or illicit drugs  Current medications and supplements including opioid prescriptions. Patient is not currently taking opioid prescriptions. Functional ability and status Nutritional status Physical activity Advanced directives List of other physicians Hospitalizations, surgeries, and ER visits in previous 12 months Vitals Screenings to include cognitive, depression, and falls Referrals and appointments  In addition, I have reviewed and discussed with patient certain preventive protocols, quality metrics, and best practice recommendations. A written personalized care plan for preventive services as well as general preventive health recommendations were provided to patient.     Randel Pigg, LPN   [id number]   Nurse Notes: none

## 2021-06-21 NOTE — Patient Instructions (Signed)
Sheila Wolf , Thank you for taking time to come for your Medicare Wellness Visit. I appreciate your ongoing commitment to your health goals. Please review the following plan we discussed and let me know if I can assist you in the future.   Screening recommendations/referrals: Colonoscopy: no longer required  Mammogram: no longer required  Bone Density: 11/03/2019 Recommended yearly ophthalmology/optometry visit for glaucoma screening and checkup Recommended yearly dental visit for hygiene and checkup  Vaccinations: Influenza vaccine: due in fall 2022  Pneumococcal vaccine: completed series  Tdap vaccine: due with injury  Shingles vaccine: completed series     Advanced directives: will provide copies   Conditions/risks identified: none   Next appointment: none    Preventive Care 45 Years and Older, Female Preventive care refers to lifestyle choices and visits with your health care provider that can promote health and wellness. What does preventive care include? A yearly physical exam. This is also called an annual well check. Dental exams once or twice a year. Routine eye exams. Ask your health care provider how often you should have your eyes checked. Personal lifestyle choices, including: Daily care of your teeth and gums. Regular physical activity. Eating a healthy diet. Avoiding tobacco and drug use. Limiting alcohol use. Practicing safe sex. Taking low-dose aspirin every day. Taking vitamin and mineral supplements as recommended by your health care provider. What happens during an annual well check? The services and screenings done by your health care provider during your annual well check will depend on your age, overall health, lifestyle risk factors, and family history of disease. Counseling  Your health care provider may ask you questions about your: Alcohol use. Tobacco use. Drug use. Emotional well-being. Home and relationship well-being. Sexual  activity. Eating habits. History of falls. Memory and ability to understand (cognition). Work and work Statistician. Reproductive health. Screening  You may have the following tests or measurements: Height, weight, and BMI. Blood pressure. Lipid and cholesterol levels. These may be checked every 5 years, or more frequently if you are over 59 years old. Skin check. Lung cancer screening. You may have this screening every year starting at age 49 if you have a 30-pack-year history of smoking and currently smoke or have quit within the past 15 years. Fecal occult blood test (FOBT) of the stool. You may have this test every year starting at age 10. Flexible sigmoidoscopy or colonoscopy. You may have a sigmoidoscopy every 5 years or a colonoscopy every 10 years starting at age 68. Hepatitis C blood test. Hepatitis B blood test. Sexually transmitted disease (STD) testing. Diabetes screening. This is done by checking your blood sugar (glucose) after you have not eaten for a while (fasting). You may have this done every 1-3 years. Bone density scan. This is done to screen for osteoporosis. You may have this done starting at age 10. Mammogram. This may be done every 1-2 years. Talk to your health care provider about how often you should have regular mammograms. Talk with your health care provider about your test results, treatment options, and if necessary, the need for more tests. Vaccines  Your health care provider may recommend certain vaccines, such as: Influenza vaccine. This is recommended every year. Tetanus, diphtheria, and acellular pertussis (Tdap, Td) vaccine. You may need a Td booster every 10 years. Zoster vaccine. You may need this after age 69. Pneumococcal 13-valent conjugate (PCV13) vaccine. One dose is recommended after age 14. Pneumococcal polysaccharide (PPSV23) vaccine. One dose is recommended after age 65. Talk  to your health care provider about which screenings and vaccines  you need and how often you need them. This information is not intended to replace advice given to you by your health care provider. Make sure you discuss any questions you have with your health care provider. Document Released: 12/03/2015 Document Revised: 07/26/2016 Document Reviewed: 09/07/2015 Elsevier Interactive Patient Education  2017  Prevention in the Home Falls can cause injuries. They can happen to people of all ages. There are many things you can do to make your home safe and to help prevent falls. What can I do on the outside of my home? Regularly fix the edges of walkways and driveways and fix any cracks. Remove anything that might make you trip as you walk through a door, such as a raised step or threshold. Trim any bushes or trees on the path to your home. Use bright outdoor lighting. Clear any walking paths of anything that might make someone trip, such as rocks or tools. Regularly check to see if handrails are loose or broken. Make sure that both sides of any steps have handrails. Any raised decks and porches should have guardrails on the edges. Have any leaves, snow, or ice cleared regularly. Use sand or salt on walking paths during winter. Clean up any spills in your garage right away. This includes oil or grease spills. What can I do in the bathroom? Use night lights. Install grab bars by the toilet and in the tub and shower. Do not use towel bars as grab bars. Use non-skid mats or decals in the tub or shower. If you need to sit down in the shower, use a plastic, non-slip stool. Keep the floor dry. Clean up any water that spills on the floor as soon as it happens. Remove soap buildup in the tub or shower regularly. Attach bath mats securely with double-sided non-slip rug tape. Do not have throw rugs and other things on the floor that can make you trip. What can I do in the bedroom? Use night lights. Make sure that you have a light by your bed that  is easy to reach. Do not use any sheets or blankets that are too big for your bed. They should not hang down onto the floor. Have a firm chair that has side arms. You can use this for support while you get dressed. Do not have throw rugs and other things on the floor that can make you trip. What can I do in the kitchen? Clean up any spills right away. Avoid walking on wet floors. Keep items that you use a lot in easy-to-reach places. If you need to reach something above you, use a strong step stool that has a grab bar. Keep electrical cords out of the way. Do not use floor polish or wax that makes floors slippery. If you must use wax, use non-skid floor wax. Do not have throw rugs and other things on the floor that can make you trip. What can I do with my stairs? Do not leave any items on the stairs. Make sure that there are handrails on both sides of the stairs and use them. Fix handrails that are broken or loose. Make sure that handrails are as long as the stairways. Check any carpeting to make sure that it is firmly attached to the stairs. Fix any carpet that is loose or worn. Avoid having throw rugs at the top or bottom of the stairs. If you do have throw rugs,  attach them to the floor with carpet tape. Make sure that you have a light switch at the top of the stairs and the bottom of the stairs. If you do not have them, ask someone to add them for you. What else can I do to help prevent falls? Wear shoes that: Do not have high heels. Have rubber bottoms. Are comfortable and fit you well. Are closed at the toe. Do not wear sandals. If you use a stepladder: Make sure that it is fully opened. Do not climb a closed stepladder. Make sure that both sides of the stepladder are locked into place. Ask someone to hold it for you, if possible. Clearly mark and make sure that you can see: Any grab bars or handrails. First and last steps. Where the edge of each step is. Use tools that help you  move around (mobility aids) if they are needed. These include: Canes. Walkers. Scooters. Crutches. Turn on the lights when you go into a dark area. Replace any light bulbs as soon as they burn out. Set up your furniture so you have a clear path. Avoid moving your furniture around. If any of your floors are uneven, fix them. If there are any pets around you, be aware of where they are. Review your medicines with your doctor. Some medicines can make you feel dizzy. This can increase your chance of falling. Ask your doctor what other things that you can do to help prevent falls. This information is not intended to replace advice given to you by your health care provider. Make sure you discuss any questions you have with your health care provider. Document Released: 09/02/2009 Document Revised: 04/13/2016 Document Reviewed: 12/11/2014 Elsevier Interactive Patient Education  2017 [employer].

## 2021-06-27 ENCOUNTER — Other Ambulatory Visit: Payer: Self-pay | Admitting: Adult Health

## 2021-06-27 DIAGNOSIS — F419 Anxiety disorder, unspecified: Secondary | ICD-10-CM

## 2021-06-27 DIAGNOSIS — F32A Depression, unspecified: Secondary | ICD-10-CM

## 2021-06-27 DIAGNOSIS — I10 Essential (primary) hypertension: Secondary | ICD-10-CM

## 2021-06-29 ENCOUNTER — Telehealth: Payer: Self-pay | Admitting: Adult Health

## 2021-06-29 NOTE — Telephone Encounter (Signed)
PT called to advise that she would like to get the Tetanus shot and would like to set that up. Saw no orders on file. Please advise.

## 2021-06-29 NOTE — Telephone Encounter (Signed)
Pt informed of the message and verbalized understanding  

## 2021-07-04 ENCOUNTER — Ambulatory Visit: Payer: Medicare Other

## 2021-07-07 DIAGNOSIS — M1712 Unilateral primary osteoarthritis, left knee: Secondary | ICD-10-CM | POA: Diagnosis not present

## 2021-07-07 DIAGNOSIS — M25462 Effusion, left knee: Secondary | ICD-10-CM | POA: Diagnosis not present

## 2021-07-07 DIAGNOSIS — M25562 Pain in left knee: Secondary | ICD-10-CM | POA: Diagnosis not present

## 2021-07-12 ENCOUNTER — Other Ambulatory Visit: Payer: Self-pay | Admitting: Adult Health

## 2021-07-14 ENCOUNTER — Telehealth: Payer: Self-pay

## 2021-07-14 DIAGNOSIS — Z0189 Encounter for other specified special examinations: Secondary | ICD-10-CM

## 2021-07-14 NOTE — Telephone Encounter (Signed)
Okay for referral?

## 2021-07-14 NOTE — Telephone Encounter (Signed)
Rob Scifries speech therapist called requesting patient referral to an Neu[facility]ists for a cognitive assessment. Call back # [phone]

## 2021-07-15 NOTE — Telephone Encounter (Signed)
Rob was not available. Staff member advised that she will let Rob know of update.

## 2021-07-15 NOTE — Addendum Note (Signed)
Addended by: Gwenyth Ober R on: 07/15/2021 01:36 PM   Modules accepted: Orders

## 2021-07-19 ENCOUNTER — Telehealth: Payer: Self-pay

## 2021-07-19 NOTE — Telephone Encounter (Signed)
Hello Sheila Wolf can this be changed from [facility] to the one they are requesting?

## 2021-07-19 NOTE — Telephone Encounter (Signed)
Daughter of patient called asking for referral to [facility] for the pt call back # [phone] [facility]

## 2021-07-20 NOTE — Telephone Encounter (Signed)
Sent to work - queue  To schedule  [facility] Phone: [phone] Their office will contact pt to schedule directly

## 2021-08-22 ENCOUNTER — Other Ambulatory Visit: Payer: Self-pay | Admitting: Adult Health

## 2021-08-25 DIAGNOSIS — Z23 Encounter for immunization: Secondary | ICD-10-CM | POA: Diagnosis not present

## 2021-09-07 ENCOUNTER — Telehealth (INDEPENDENT_AMBULATORY_CARE_PROVIDER_SITE_OTHER): Payer: Medicare Other | Admitting: Adult Health

## 2021-09-07 ENCOUNTER — Encounter: Payer: Self-pay | Admitting: Adult Health

## 2021-09-07 VITALS — Ht 63.0 in | Wt 126.0 lb

## 2021-09-07 DIAGNOSIS — K529 Noninfective gastroenteritis and colitis, unspecified: Secondary | ICD-10-CM

## 2021-09-07 NOTE — Progress Notes (Signed)
Virtual Visit via Telephone Note  I connected with Sheila Wolf on 09/07/21 at 10:30 AM EDT by telephone and verified that I am speaking with the correct person using two identifiers.   I discussed the limitations, risks, security and privacy concerns of performing an evaluation and management service by telephone and the availability of in person appointments. I also discussed with the patient that there may be a patient responsible charge related to this service. The patient expressed understanding and agreed to proceed.  Location patient: home Location provider: work or home office Participants present for the call: patient, provider Patient did not have a visit in the prior 7 days to address this/these issue(s).   History of Present Illness: 87 year old female who is being evaluated today for an acute issue.  She reports that yesterday she was not feeling well, had multiple episodes of nausea and vomiting.  Other associated symptoms included dry cough and runny nose with a headache.  She had some ginger ale yesterday evening and started to feel better.  When she woke up this morning she was "feeling a lot better" and has been able to keep planned food down without any nausea or vomiting.  At no time did she have any fevers, chills, diarrhea.   Observations/Objective: Patient sounds cheerful and well on the phone. I do not appreciate any SOB. Speech and thought processing are grossly intact. Patient reported vitals:  Assessment and Plan: 1. Gastroenteritis -Encouraged bland diet for the next 36 hours.  Can advance as tolerated.  Follow-up if signs or symptoms return.  Dorothyann Peng, NP    Follow Up Instructions:  DIAGMED@   [phone] 5-10 [id number] 11-20 [id number] 21-30 I did not refer this patient for an OV in the next 24 hours for this/these issue(s).  I discussed the assessment and treatment plan with the patient. The patient was provided an opportunity to ask questions  and all were answered. The patient agreed with the plan and demonstrated an understanding of the instructions.   The patient was advised to call back or seek an in-person evaluation if the symptoms worsen or if the condition fails to improve as anticipated.  I provided 11 minutes of non-face-to-face time during this encounter.   Dorothyann Peng, NP

## 2021-10-07 DIAGNOSIS — M25462 Effusion, left knee: Secondary | ICD-10-CM | POA: Diagnosis not present

## 2021-10-07 DIAGNOSIS — M1712 Unilateral primary osteoarthritis, left knee: Secondary | ICD-10-CM | POA: Diagnosis not present

## 2021-10-07 DIAGNOSIS — M25562 Pain in left knee: Secondary | ICD-10-CM | POA: Diagnosis not present

## 2021-10-26 ENCOUNTER — Other Ambulatory Visit: Payer: Self-pay | Admitting: Adult Health

## 2021-10-26 DIAGNOSIS — R519 Headache, unspecified: Secondary | ICD-10-CM

## 2021-11-25 ENCOUNTER — Other Ambulatory Visit: Payer: Self-pay | Admitting: Adult Health

## 2021-11-25 DIAGNOSIS — Z760 Encounter for issue of repeat prescription: Secondary | ICD-10-CM

## 2021-12-07 DIAGNOSIS — H04123 Dry eye syndrome of bilateral lacrimal glands: Secondary | ICD-10-CM | POA: Diagnosis not present

## 2021-12-07 DIAGNOSIS — H5211 Myopia, right eye: Secondary | ICD-10-CM | POA: Diagnosis not present

## 2021-12-07 DIAGNOSIS — Z961 Presence of intraocular lens: Secondary | ICD-10-CM | POA: Diagnosis not present

## 2021-12-26 ENCOUNTER — Other Ambulatory Visit: Payer: Self-pay | Admitting: Adult Health

## 2021-12-26 ENCOUNTER — Telehealth: Payer: Self-pay | Admitting: Adult Health

## 2021-12-26 DIAGNOSIS — R519 Headache, unspecified: Secondary | ICD-10-CM

## 2021-12-26 NOTE — Telephone Encounter (Signed)
Patient would like a phone call back from [place]. She's just basically wanting to let [name] know that she tested positive for covid.   Patient declined virtual visit but then accepted. Patient is now scheduled for 10:20am tomorrow with Dr.Kim.  Patient could be contacted at [phone] or [phone].  Please advise.

## 2021-12-27 ENCOUNTER — Encounter: Payer: Self-pay | Admitting: Family Medicine

## 2021-12-27 ENCOUNTER — Telehealth: Payer: Self-pay | Admitting: Adult Health

## 2021-12-27 ENCOUNTER — Telehealth (INDEPENDENT_AMBULATORY_CARE_PROVIDER_SITE_OTHER): Payer: Medicare Other | Admitting: Family Medicine

## 2021-12-27 VITALS — Ht 63.0 in | Wt 126.0 lb

## 2021-12-27 DIAGNOSIS — U071 COVID-19: Secondary | ICD-10-CM | POA: Diagnosis not present

## 2021-12-27 MED ORDER — BENZONATATE 100 MG PO CAPS: 100.0000 mg | ORAL_CAPSULE | Freq: Three times a day (TID) | ORAL | 0 refills | Status: DC | PRN

## 2021-12-27 MED ORDER — NIRMATRELVIR/RITONAVIR (PAXLOVID)TABLET: 3.0000 | ORAL_TABLET | Freq: Two times a day (BID) | ORAL | 0 refills | Status: AC

## 2021-12-27 NOTE — Telephone Encounter (Signed)
Spoke with Barnett Applebaum, pharmacist at [facility] to relay the message below.  Barnett Applebaum stated she already spoke with [facility] and actually the message was supposed to be sent regarding Simvastatin and Butalbital, not Benzonatate.  Barnett Applebaum stated the patient will receive the medication and no further help is needed.

## 2021-12-27 NOTE — Patient Instructions (Signed)
HOME CARE TIPS:  -COVID19 testing information: [email or web address]  Most pharmacies also offer testing and home test kits. If the Covid19 test is positive and you desire antiviral treatment, please contact a [facility] or schedule a follow up virtual visit through your primary care office or through the [employer] [facility].  Other test to treat options: [email or web address]  -I sent the medication(s) we discussed to your pharmacy: Meds ordered this encounter  Medications   nirmatrelvir/ritonavir EUA (PAXLOVID) 20 x 150 MG & 10 x 100MG TABS    Sig: Take 3 tablets by mouth 2 (two) times daily for 5 days. (Take nirmatrelvir 150 mg two tablets twice daily for 5 days and ritonavir 100 mg one tablet twice daily for 5 days) Patient GFR is > 65    Dispense:  30 tablet    Refill:  0   benzonatate (TESSALON PERLES) 100 MG capsule    Sig: Take 1 capsule (100 mg total) by mouth 3 (three) times daily as needed.    Dispense:  20 capsule    Refill:  0     -I sent in the [place] treatment or referral you requested per our discussion. Please see the information provided below and discuss further with the pharmacist/treatment team.  -If taking Paxlovid, please review all medications, supplement and over the counter drugs with your pharmacist and ask them to check for any interactions. Please make the following changes to your regular medications while taking Paxlovid: *HOLD your simvastatin while taking paxlovid and restart 3 days after finishing the paxlovid *take one half dose of your norvasc(amlodipine) while on the paxlovid  -there is a chance of rebound illness after finishing your treatment. If you become sick again please isolate for an additional 5 days, plus 5 more days of masking.   -can use tylenol  if needed for fevers, aches and pains per instructions  -can use nasal saline a few times per day if you have nasal  congestion  -stay hydrated, drink plenty of fluids and eat small healthy meals - avoid dairyD19  -follow up with your doctor in 2-3 days unless improving and feeling better  -stay home while sick, except to seek medical care. If you have COVID19, you will likely be contagious for 7-10 days. Flu or Influenza is likely contagious for about 7 days. Other respiratory viral infections remain contagious for 5-10+ days depending on the virus and many other factors. Wear a good mask that fits snugly (such as N95 or KN95) if around others to reduce the risk of transmission.  It was nice to meet you today, and I really hope you are feeling better soon. I help [facility] out with telemedicine visits on Tuesdays and Thursdays and am happy to help if you need a follow up virtual visit on those days. Otherwise, if you have any concerns or questions following this visit please schedule a follow up visit with your Primary Care doctor or seek care at a local urgent care clinic to avoid delays in care.    Seek in person care or schedule a follow up video visit promptly if your symptoms worsen, new concerns arise or you are not improving with treatment. Call 911 and/or seek emergency care if your symptoms are severe or life threatening.  FACT SHEET FOR PATIENTS, PARENTS, AND CAREGIVERS EMERGENCY USE AUTHORIZATION (EUA) OF PAXLOVID FOR CORONAVIRUS DISEASE 2019 (COVID-19) You are being given this Fact Sheet because your healthcare provider believes it is necessary to provide you with PAXLOVID for the treatment  of mild-to-moderate coronavirus disease (COVID-19) caused by the SARS-CoV-2 virus. This Fact Sheet contains information to help you understand the risks and benefits of taking the PAXLOVID you have received or may receive. The U.S. Food and Drug Administration (FDA) has issued an Emergency Use Authorization (EUA) to make PAXLOVID available during the COVID-19 pandemic (for more details about an EUA please  see What is an Emergency Use Authorization? at the end of this document). PAXLOVID is not an FDA-approved medicine in the [place]. Read this Fact Sheet for information about PAXLOVID. Talk to your healthcare provider about your options or if you have any questions. It is your choice to take PAXLOVID.  What is COVID-19? COVID-19 is caused by a virus called a coronavirus. You can get COVID-19 through close contact with another person who has the virus. COVID-19 illnesses have ranged from very mild-to-severe, including illness resulting in death. While information so far suggests that most COVID-19 illness is mild, serious illness can happen and may cause some of your other medical conditions to become worse. Older people and people of all ages with severe, long lasting (chronic) medical conditions like heart disease, lung disease, and diabetes, for example seem to be at higher risk of being hospitalized for COVID-19.  What is PAXLOVID? PAXLOVID is an investigational medicine used to treat mild-to-moderate COVID-19 in adults and children [63 years of age and older weighing at least 66 pounds (72 kg)] with positive results of direct SARS-CoV-2 viral testing, and who are at high risk for progression to severe COVID-19, including hospitalization or death. PAXLOVID is investigational because it is still being studied. There is limited information about the safety and effectiveness of using PAXLOVID to treat people with mild-to-moderate COVID-19.  The FDA has authorized the emergency use of PAXLOVID for the treatment of mild-tomoderate COVID-19 in adults and children [95 years of age and older weighing at least 25 pounds (38 kg)] with a positive test for the virus that causes COVID-19, and who are at high risk for progression to severe COVID-19, including hospitalization or death, under an EUA. 1 Revised: 04 February 2021   What should I tell my healthcare provider before I take  PAXLOVID? Tell your healthcare provider if you: ? Have any allergies ? Have liver or kidney disease ? Are pregnant or plan to become pregnant ? Are breastfeeding a child ? Have any serious illnesses  Tell your healthcare provider about all the medicines you take, including prescription and over-the-counter medicines, vitamins, and herbal supplements. Some medicines may interact with PAXLOVID and may cause serious side effects. Keep a list of your medicines to show your healthcare provider and pharmacist when you get a new medicine.  You can ask your healthcare provider or pharmacist for a list of medicines that interact with PAXLOVID. Do not start taking a new medicine without telling your healthcare provider. Your healthcare provider can tell you if it is safe to take PAXLOVID with other medicines.  Tell your healthcare provider if you are taking combined hormonal contraceptive. PAXLOVID may affect how your birth control pills work. Females who are able to become pregnant should use another effective alternative form of contraception or an additional barrier method of contraception. Talk to your healthcare provider if you have any questions about contraceptive methods that might be right for you.  How do I take PAXLOVID? ? PAXLOVID consists of 2 medicines: nirmatrelvir and ritonavir. o Take 2 pink tablets of nirmatrelvir with 1 white tablet of ritonavir by mouth 2  times each day (in the morning and in the evening) for 5 days. For each dose, take all 3 tablets at the same time. o If you have kidney disease, talk to your healthcare provider. You may need a different dose. ? Swallow the tablets whole. Do not chew, break, or crush the tablets. ? Take PAXLOVID with or without food. ? Do not stop taking PAXLOVID without talking to your healthcare provider, even if you feel better. ? If you miss a dose of PAXLOVID within 8 hours of the time it is usually taken, take it as soon as you  remember. If you miss a dose by more than 8 hours, skip the missed dose and take the next dose at your regular time. Do not take 2 doses of PAXLOVID at the same time. ? If you take too much PAXLOVID, call your healthcare provider or go to the nearest hospital emergency room right away. ? If you are taking a ritonavir- or cobicistat-containing medicine to treat hepatitis C or Human Immunodeficiency Virus (HIV), you should continue to take your medicine as prescribed by your healthcare provider. 2 Revised: 04 February 2021    Talk to your healthcare provider if you do not feel better or if you feel worse after 5 days.  Who should generally not take PAXLOVID? Do not take PAXLOVID if: ? You are allergic to nirmatrelvir, ritonavir, or any of the ingredients in PAXLOVID. ? You are taking any of the following medicines: o Alfuzosin o Pethidine, propoxyphene o Ranolazine o Amiodarone, dronedarone, flecainide, propafenone, quinidine o Colchicine o Lurasidone, pimozide, clozapine o Dihydroergotamine, ergotamine, methylergonovine o Lovastatin, simvastatin o Sildenafil (Revatio) for pulmonary arterial hypertension (PAH) o Triazolam, oral midazolam o Apalutamide o Carbamazepine, phenobarbital, phenytoin o Rifampin o St. Johns Wort (hypericum perforatum) Taking PAXLOVID with these medicines may cause serious or life-threatening side effects or affect how PAXLOVID works.  These are not the only medicines that may cause serious side effects if taken with PAXLOVID. PAXLOVID may increase or decrease the levels of multiple other medicines. It is very important to tell your healthcare provider about all of the medicines you are taking because additional laboratory tests or changes in the dose of your other medicines may be necessary while you are taking PAXLOVID. Your healthcare provider may also tell you about specific symptoms to watch out for that may indicate that you need to stop or decrease  the dose of some of your other medicines.  What are the important possible side effects of PAXLOVID? Possible side effects of PAXLOVID are: ? Allergic Reactions. Allergic reactions can happen in people taking PAXLOVID, even after only 1 dose. Stop taking PAXLOVID and call your healthcare provider right away if you get any of the following symptoms of an allergic reaction: o hives o trouble swallowing or breathing o swelling of the mouth, lips, or face o throat tightness o hoarseness 3 Revised: 04 February 2021  o skin rash ? Liver Problems. Tell your healthcare provider right away if you have any of these signs and symptoms of liver problems: loss of appetite, yellowing of your skin and the whites of eyes (jaundice), dark-colored urine, pale colored stools and itchy skin, stomach area (abdominal) pain. ? Resistance to HIV Medicines. If you have untreated HIV infection, PAXLOVID may lead to some HIV medicines not working as well in the future. ? Other possible side effects include: o altered sense of taste o diarrhea o high blood pressure o muscle aches These are not all the  possible side effects of PAXLOVID. Not many people have taken PAXLOVID. Serious and unexpected side effects may happen. PAXLOVID is still being studied, so it is possible that all of the risks are not known at this time.  What other treatment choices are there? Veklury (remdesivir) is FDA-approved for the treatment of mild-to-moderate NTZGY-17 in certain adults and children. Talk with your doctor to see if Marijean Heath is appropriate for you. Like PAXLOVID, FDA may also allow for the emergency use of other medicines to treat people with COVID-19. Go to https://price.info/ for information on the emergency use of other medicines that are authorized by FDA to treat people with COVID-19. Your healthcare provider may  talk with you about clinical trials for which you may be eligible. It is your choice to be treated or not to be treated with PAXLOVID. Should you decide not to receive it or for your child not to receive it, it will not change your standard medical care.  What if I am pregnant or breastfeeding? There is no experience treating pregnant women or breastfeeding mothers with PAXLOVID. For a mother and unborn baby, the benefit of taking PAXLOVID may be greater than the risk from the treatment. If you are pregnant, discuss your options and specific situation with your healthcare provider. It is recommended that you use effective barrier contraception or do not have sexual activity while taking PAXLOVID. If you are breastfeeding, discuss your options and specific situation with your healthcare provider. 4 Revised: 04 February 2021   How do I report side effects with PAXLOVID? Contact your healthcare provider if you have any side effects that bother you or do not go away. Report side effects to FDA MedWatch at SmoothHits.hu or call 1-800-FDA1088 or you can report side effects to [employer]. at the contact information provided below. Website Fax number Telephone number www.pfizersafetyreporting.com [phone] How should I store [place]? Store PAXLOVID tablets at room temperature, between 68?F to 77?F (20?C to 25?C). How can I learn more about COVID-19? ? Ask your healthcare provider. ? Visit https://jacobson-johnson.[email or web address] ? Contact your local or state public health department. What is an Emergency Use Authorization (EUA)? The [place] FDA has made PAXLOVID available under an emergency access mechanism called an Emergency Use Authorization (EUA). The EUA is supported by a Education officer, museum and Human Service (HHS) declaration that circumstances exist to justify the emergency use of drugs and biological products during the COVID-19 pandemic. PAXLOVID for the  treatment of mild-to-moderate COVID-19 in adults and children [61 years of age and older weighing at least 68 pounds (29 kg)] with positive results of direct SARS-CoV-2 viral testing, and who are at high risk for progression to severe COVID-19, including hospitalization or death, has not undergone the same type of review as an FDA-approved product. In issuing an EUA under the SVXBL-39 public health emergency, the FDA has determined, among other things, that based on the total amount of scientific evidence available including data from adequate and well-controlled clinical trials, if available, it is reasonable to believe that the product may be effective for diagnosing, treating, or preventing COVID-19, or a serious or life-threatening disease or condition caused by COVID-19; that the known and potential benefits of the product, when used to diagnose, treat, or prevent such disease or condition, outweigh the known and potential risks of such product; and that there are no adequate, approved, and available alternatives. All of these criteria must be met to allow for the product to be used in the treatment  of patients during the COVID-19 pandemic. The EUA for PAXLOVID is in effect for the duration of the COVID-19 declaration justifying emergency use of this product, unless terminated or revoked (after which the products may no longer be used under the EUA). 5 Revised: 04 February 2021     Additional Information For general questions, visit the website or call the telephone number provided below. Website Telephone number www.COVID19oralRx.com [phone] (1-877-C19-PACK) You can also go to www.pfizermedinfo.com or call 6132948353 for more information. [id number] Revised: 04 February 2021

## 2021-12-27 NOTE — Telephone Encounter (Signed)
Pharmacy called in stating that there may be an interaction with the medication patient is taking. Pharmacy tech wants to know if Dr.Kim wants the patient to stop taking Benzonatate while taking the Paxlovid or what should she do.  Pharmacy could be contacted at [phone].  Please advise.

## 2021-12-27 NOTE — Progress Notes (Signed)
Virtual Visit via Telephone Note  I connected with Sheila Wolf on 12/27/21 at 10:20 AM EST by telephone and verified that I am speaking with the correct person using two identifiers.   I discussed the limitations of performing an evaluation and management service by telephone and requested permission for a phone visit. The patient expressed understanding and agreed to proceed.  Location patient:  [place] Location provider: work or home office Participants present for the call: patient, provider Patient did not have a visit with me in the prior 7 days to address this/these issue(s).   History of Present Illness:  Acute telemedicine visit for Covid19: -Onset:yesterday -Symptoms include: nasal congestion, cough, low grade temp, mild body aches, vomited once -Denies: CP, SOB, vomiting any further -GFR was 65 on last check within the last year, no reported hx of renal or liver disease -drinking fluids, able to get up and down out of bed -Pertinent past medical history: see below, she has not had covid before -Pertinent medication allergies:  Allergies  Allergen Reactions   Penicillins Rash  -COVID-19 vaccine status: Immunization History  Administered Date(s) Administered   Fluad Quad(high Dose 65+) 08/23/2019, 08/02/2020   Influenza, High Dose Seasonal PF 09/04/2017, 08/23/2018, 08/22/2021   PFIZER(Purple Top)SARS-COV-2 Vaccination 12/11/2019, 01/01/2020, 08/25/2020   Pneumococcal Conjugate-13 07/29/2014, 11/30/2018   Pneumococcal Polysaccharide-23 03/20/2012, 11/29/2018   Zoster Recombinat (Shingrix) 10/06/2017, 01/07/2018      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 90 tablet 3   butalbital-acetaminophen-caffeine (FIORICET) 50-325-40 MG tablet TAKE 1 TABLET BY MOUTH 4 TIMES DAILY AS NEEDED FOR HEADACHE 60 tablet 1   cetirizine (ZYRTEC) 10 MG tablet TAKE 1 TABLET BY MOUTH EVERY DAY 90 tablet 3   cycloSPORINE (RESTASIS) 0.05 % ophthalmic emulsion 1 drop 2 (two) times daily.     escitalopram (LEXAPRO) 20 MG tablet Take 1 tablet by mouth daily. 90 tablet 1   fluticasone (FLONASE) 50 MCG/ACT nasal spray Place 2 sprays into both nostrils daily. 16 g 6   Melatonin 5 MG CAPS Take by mouth.     meloxicam (MOBIC) 15 MG tablet TAKE 1 TABLET BY MOUTH EVERY DAY 90 tablet 1   metoprolol succinate (TOPROL-XL) 50 MG 24 hr tablet TAKE 1 TABLET BY MOUTH EVERY DAY WITH OR IMMEDIATELY following A MEAL 90 tablet 1   nystatin-triamcinolone ointment (MYCOLOG) apply topically 2 TIMES DAILY 30 g 1   omeprazole (PRILOSEC) 20 MG capsule TAKE 1 CAPSULE BY MOUTH EVERY DAY 90 capsule 0   simvastatin (ZOCOR) 10 MG tablet Take 1 tablet (10 mg total) by mouth at bedtime. 90 tablet 3   traMADol (ULTRAM) 50 MG tablet      No current facility-administered medications on file prior to visit.    Observations/Objective: Patient sounds cheerful and well on the phone. I do not appreciate any SOB. Speech and thought processing are grossly intact. Patient reported vitals:  Assessment and Plan:  COVID-19   Discussed treatment options and risk of drug interactions, ideal treatment window, potential complications, isolation and precautions for COVID-19.  Discussed possibility of rebound with antivirals  and the need to reisolate if it should occur for 5 days.  After lengthy discussion, the patient opted for treatment with Paxlovid due to being higher risk for complications of covid or severe disease and other factors. Discussed EUA status of this drug and the fact that there is preliminary limited knowledge of risks/interactions/side effects per  EUA document vs possible benefits and precautions. She agrees to hold her simvastatin for 8 days and to take a half dose of her amlodipine. She does not take the fioricet and will hold as well during treatment. This information was shared with patient during the visit and also was provided in patient instructions. Also, advised that patient discuss risks/interactions and use with pharmacist/treatment team as well.  The patient did want a prescription for cough, Tessalon Rx sent.  Other symptomatic care measures summarized in patient instructions. Advised to seek prompt virtual visit or in person care if worsening, new symptoms arise, or if is not improving with treatment as expected per our conversation of expected course. Discussed options for follow up care. Did let this patient know that I do telemedicine on Tuesdays and Thursdays for [place] and those are the days I am logged into the system. Advised to schedule follow up visit with PCP, [place] virtual visits or [facility] if any further questions or concerns to avoid delays in care.   I discussed the assessment and treatment plan with the patient. The patient was provided an opportunity to ask questions and all were answered. The patient agreed with the plan and demonstrated an understanding of the instructions.    Follow Up Instructions:  I did not refer this patient for an OV with me in the next 24 hours for this/these issue(s).  I discussed the assessment and treatment plan with the patient. The patient was provided an opportunity to ask questions and all were answered. The patient agreed with the plan and demonstrated an understanding of the instructions.   I spent 19 minutes on the date of this visit in the care of this patient. See summary of tasks completed to properly care for this patient in the detailed notes above which also included counseling of above, review of PMH, medications, allergies, evaluation of the patient and ordering and/or   instructing patient on testing and care options.     Lucretia Kern, DO

## 2021-12-27 NOTE — Telephone Encounter (Signed)
FYI

## 2021-12-27 NOTE — Telephone Encounter (Signed)
Okay for refill?    LOV video visit on 09/07/2021 for GI issues   Last Refill 10/27/2021  60 QTY. 1 Refills

## 2022-01-04 ENCOUNTER — Telehealth: Payer: Self-pay | Admitting: Adult Health

## 2022-01-04 DIAGNOSIS — R296 Repeated falls: Secondary | ICD-10-CM

## 2022-01-04 NOTE — Telephone Encounter (Signed)
Please advise 

## 2022-01-04 NOTE — Telephone Encounter (Signed)
Order faxed to [facility]

## 2022-01-04 NOTE — Telephone Encounter (Signed)
Received confirmation fax.

## 2022-01-04 NOTE — Telephone Encounter (Signed)
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

## 2022-01-10 DIAGNOSIS — R2681 Unsteadiness on feet: Secondary | ICD-10-CM | POA: Diagnosis not present

## 2022-01-10 DIAGNOSIS — M25562 Pain in left knee: Secondary | ICD-10-CM | POA: Diagnosis not present

## 2022-01-10 DIAGNOSIS — Z9181 History of falling: Secondary | ICD-10-CM | POA: Diagnosis not present

## 2022-01-10 DIAGNOSIS — M6281 Muscle weakness (generalized): Secondary | ICD-10-CM | POA: Diagnosis not present

## 2022-01-10 DIAGNOSIS — R2689 Other abnormalities of gait and mobility: Secondary | ICD-10-CM | POA: Diagnosis not present

## 2022-01-14 ENCOUNTER — Other Ambulatory Visit: Payer: Self-pay | Admitting: Adult Health

## 2022-01-14 DIAGNOSIS — F419 Anxiety disorder, unspecified: Secondary | ICD-10-CM

## 2022-01-14 DIAGNOSIS — F32A Depression, unspecified: Secondary | ICD-10-CM

## 2022-01-16 DIAGNOSIS — R2689 Other abnormalities of gait and mobility: Secondary | ICD-10-CM | POA: Diagnosis not present

## 2022-01-16 DIAGNOSIS — R2681 Unsteadiness on feet: Secondary | ICD-10-CM | POA: Diagnosis not present

## 2022-01-16 DIAGNOSIS — M6281 Muscle weakness (generalized): Secondary | ICD-10-CM | POA: Diagnosis not present

## 2022-01-16 DIAGNOSIS — Z9181 History of falling: Secondary | ICD-10-CM | POA: Diagnosis not present

## 2022-01-16 DIAGNOSIS — M25562 Pain in left knee: Secondary | ICD-10-CM | POA: Diagnosis not present

## 2022-01-19 ENCOUNTER — Other Ambulatory Visit: Payer: Self-pay | Admitting: Adult Health

## 2022-01-19 DIAGNOSIS — Z9181 History of falling: Secondary | ICD-10-CM | POA: Diagnosis not present

## 2022-01-19 DIAGNOSIS — I10 Essential (primary) hypertension: Secondary | ICD-10-CM

## 2022-01-19 DIAGNOSIS — R2689 Other abnormalities of gait and mobility: Secondary | ICD-10-CM | POA: Diagnosis not present

## 2022-01-19 DIAGNOSIS — R2681 Unsteadiness on feet: Secondary | ICD-10-CM | POA: Diagnosis not present

## 2022-01-19 DIAGNOSIS — M25562 Pain in left knee: Secondary | ICD-10-CM | POA: Diagnosis not present

## 2022-01-19 DIAGNOSIS — M6281 Muscle weakness (generalized): Secondary | ICD-10-CM | POA: Diagnosis not present

## 2022-01-20 DIAGNOSIS — M25562 Pain in left knee: Secondary | ICD-10-CM | POA: Diagnosis not present

## 2022-01-20 DIAGNOSIS — M1712 Unilateral primary osteoarthritis, left knee: Secondary | ICD-10-CM | POA: Diagnosis not present

## 2022-01-20 DIAGNOSIS — M25462 Effusion, left knee: Secondary | ICD-10-CM | POA: Diagnosis not present

## 2022-01-23 ENCOUNTER — Telehealth: Payer: Self-pay | Admitting: Adult Health

## 2022-01-23 NOTE — Telephone Encounter (Signed)
Patient called because she is confused on the referral for PT that was sent in by [facility]. Patient states that someone named Jinny Blossom was just at her home to help her regarding her balance, so she is unsure on why the referral was placed. Patient would like a call back to discuss. ? ? ? ? ?Please advise  ?

## 2022-01-25 DIAGNOSIS — R2689 Other abnormalities of gait and mobility: Secondary | ICD-10-CM | POA: Diagnosis not present

## 2022-01-25 DIAGNOSIS — M6281 Muscle weakness (generalized): Secondary | ICD-10-CM | POA: Diagnosis not present

## 2022-01-25 DIAGNOSIS — M25562 Pain in left knee: Secondary | ICD-10-CM | POA: Diagnosis not present

## 2022-01-25 DIAGNOSIS — Z9181 History of falling: Secondary | ICD-10-CM | POA: Diagnosis not present

## 2022-01-25 DIAGNOSIS — R2681 Unsteadiness on feet: Secondary | ICD-10-CM | POA: Diagnosis not present

## 2022-01-25 NOTE — Telephone Encounter (Signed)
Called pt but no answer. Will try again shortly.  ?

## 2022-01-26 NOTE — Telephone Encounter (Signed)
Spoke to pt and she stated that she just wanted to know how much rehab is costing her and how is it being paid. Pt advised to reach out to PT with questions. Pt advised that I am unaware. Pt verbalized understanding.  ?

## 2022-01-27 ENCOUNTER — Ambulatory Visit (INDEPENDENT_AMBULATORY_CARE_PROVIDER_SITE_OTHER): Payer: Medicare Other | Admitting: Adult Health

## 2022-01-27 ENCOUNTER — Encounter: Payer: Self-pay | Admitting: Adult Health

## 2022-01-27 VITALS — BP 130/70 | HR 55 | Temp 98.1°F | Ht 63.0 in | Wt 127.0 lb

## 2022-01-27 DIAGNOSIS — M6281 Muscle weakness (generalized): Secondary | ICD-10-CM | POA: Diagnosis not present

## 2022-01-27 DIAGNOSIS — L700 Acne vulgaris: Secondary | ICD-10-CM

## 2022-01-27 DIAGNOSIS — M25562 Pain in left knee: Secondary | ICD-10-CM | POA: Diagnosis not present

## 2022-01-27 DIAGNOSIS — R2689 Other abnormalities of gait and mobility: Secondary | ICD-10-CM | POA: Diagnosis not present

## 2022-01-27 DIAGNOSIS — R2681 Unsteadiness on feet: Secondary | ICD-10-CM | POA: Diagnosis not present

## 2022-01-27 DIAGNOSIS — Z9181 History of falling: Secondary | ICD-10-CM | POA: Diagnosis not present

## 2022-01-27 NOTE — Progress Notes (Signed)
? ?Subjective:  ? ? Patient ID: Sheila Wolf, female    DOB: [birth year], 88 y.o.   MRN: [id number] ? ?HPI ? 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 ? ?She presents to the office today for concern of neoplasm on her right nare.  First noticed 2 to 3 weeks ago.  Denies pain, itching, creased in size, or drainage. ? ? ?Review of Systems ?See HPI  ? ?Past Medical History:  ?Diagnosis Date  ? Anxiety and depression   ? Arthritis   ? Frequent headaches   ? GERD (gastroesophageal reflux disease)   ? Hypertension   ? Osteoporosis   ? 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 Urinary incontinence   ? ? ?Social History  ? ?Socioeconomic History  ? Marital status: Widowed  ?  Spouse name: Not on file  ? Number of children: Not on file  ? Years of education: Not on file  ? Highest education level: Not on file  ?Occupational History  ? Not on file  ?Tobacco Use  ? Smoking status: Former  ?  Years: 10.00  ?  Types: Cigarettes  ? Smokeless tobacco: Never  ?Vaping Use  ? Vaping Use: Never used  ?Substance and Sexual Activity  ? Alcohol use: Yes  ?  Alcohol/week: 10.0 standard drinks  ?  Types: 10 Glasses of wine per week  ? Drug use: No  ? Sexual activity: Never  ?Other Topics Concern  ? Not on file  ?Social History Narrative  ? Not on file  ? ?Social Determinants of Health  ? ?Financial Resource Strain: Low Risk   ? Difficulty of Paying Living Expenses: Not hard at all  ?Food Insecurity: No Food Insecurity  ? Worried About Charity fundraiser in the Last Year: Never true  ? Ran Out of Food in the Last Year: Never true  ?Transportation Needs: No Transportation Needs  ? Lack of Transportation (Medical): No  ? Lack of Transportation (Non-Medical): No  ?Physical Activity: Not on file  ?Stress: No Stress Concern Present  ? Feeling of Stress : Not at  all  ?Social Connections: Moderately Isolated  ? Frequency of Communication with Friends and Family: Three times a week  ? Frequency of Social Gatherings with Friends and Family: Three times a week  ? Attends Religious Services: 1 to 4 times per year  ? Active Member of Clubs or Organizations: No  ? Attends Archivist Meetings: Never  ? Marital Status: Widowed  ?Intimate Partner Violence: Not At Risk  ? Fear of Current or Ex-Partner: No  ? Emotionally Abused: No  ? Physically Abused: No  ? Sexually Abused: No  ? ? ?Past Surgical History:  ?Procedure Laterality Date  ? APPENDECTOMY    ? BUNIONECTOMY    ? CERVICAL SPINE SURGERY    ? SHOULDER SURGERY    ? TONSILLECTOMY AND ADENOIDECTOMY    ? ? ?Family History  ?Problem Relation Age of Onset  ? Arthritis Mother   ? Hearing loss Mother   ? Heart disease Mother   ? Hypertension Mother   ? Alcohol abuse Father   ? Early death Father   ? Heart disease Father   ? Stroke Father   ? Birth defects Brother   ? Heart disease Brother   ? ? ?Allergies  ?Allergen Reactions  ? Penicillins Rash  ? ? ?Current Outpatient  Medications on File Prior to Visit  ?Medication Sig Dispense Refill  ? amLODipine (NORVASC) 5 MG tablet Take 1 tablet (5 mg total) by mouth daily. 90 tablet 3  ? benzonatate (TESSALON PERLES) 100 MG capsule Take 1 capsule (100 mg total) by mouth 3 (three) times daily as needed. 20 capsule 0  ? butalbital-acetaminophen-caffeine (FIORICET) 50-325-40 MG tablet TAKE 1 TABLET BY MOUTH EVERY DAY 30 tablet 1  ? cetirizine (ZYRTEC) 10 MG tablet TAKE 1 TABLET BY MOUTH EVERY DAY 90 tablet 3  ? cycloSPORINE (RESTASIS) 0.05 % ophthalmic emulsion 1 drop 2 (two) times daily.    ? escitalopram (LEXAPRO) 20 MG tablet TAKE 1 TABLET BY MOUTH EVERY DAY 90 tablet 1  ? fluticasone (FLONASE) 50 MCG/ACT nasal spray Place 2 sprays into both nostrils daily. 16 g 6  ? Melatonin 5 MG CAPS Take by mouth.    ? meloxicam (MOBIC) 15 MG tablet TAKE 1 TABLET BY MOUTH EVERY DAY 90 tablet 1  ?  metoprolol succinate (TOPROL-XL) 50 MG 24 hr tablet TAKE 1 TABLET BY MOUTH EVERY DAY WITH OR IMMEDIATELY FOLLOWING A MEAL 90 tablet 1  ? nystatin-triamcinolone ointment (MYCOLOG) apply topically 2 TIMES DAILY 30 g 1  ? omeprazole (PRILOSEC) 20 MG capsule TAKE 1 CAPSULE BY MOUTH EVERY DAY 90 capsule 0  ? simvastatin (ZOCOR) 10 MG tablet TAKE 1 TABLET BY MOUTH AT BEDTIME 90 tablet 3  ? traMADol (ULTRAM) 50 MG tablet     ? ?No current facility-administered medications on file prior to visit.  ? ? ?BP 130/70   Pulse (!) 55   Temp 98.1 ?F (36.7 ?C) (Oral)   Ht '5\' 3"'$  (1.6 m)   Wt 127 lb (57.6 kg)   SpO2 96%   BMI 22.50 kg/m?  ? ? ?   ?Objective:  ? Physical Exam ?Vitals and nursing note reviewed.  ?Constitutional:   ?   Appearance: Normal appearance.  ?Skin: ?   General: Skin is warm and dry.  ?   Comments: She has a small blackhead on her right nare.  ?Neurological:  ?   General: No focal deficit present.  ?   Mental Status: She is alert and oriented to person, place, and time.  ?Psychiatric:     ?   Mood and Affect: Mood normal.     ?   Behavior: Behavior normal.     ?   Thought Content: Thought content normal.     ?   Judgment: Judgment normal.  ? ?   ?Assessment & Plan:  ?1. [place] ?-Assurance given that there is no concern for malignancy.  Can use over-the-counter face washes with Salicylic acid such as Neutrogena.  ? ?Dorothyann Peng, NP ? ?

## 2022-01-30 DIAGNOSIS — Z9181 History of falling: Secondary | ICD-10-CM | POA: Diagnosis not present

## 2022-01-30 DIAGNOSIS — R2681 Unsteadiness on feet: Secondary | ICD-10-CM | POA: Diagnosis not present

## 2022-01-30 DIAGNOSIS — M6281 Muscle weakness (generalized): Secondary | ICD-10-CM | POA: Diagnosis not present

## 2022-01-30 DIAGNOSIS — M25562 Pain in left knee: Secondary | ICD-10-CM | POA: Diagnosis not present

## 2022-01-30 DIAGNOSIS — R2689 Other abnormalities of gait and mobility: Secondary | ICD-10-CM | POA: Diagnosis not present

## 2022-02-02 DIAGNOSIS — R2681 Unsteadiness on feet: Secondary | ICD-10-CM | POA: Diagnosis not present

## 2022-02-02 DIAGNOSIS — R2689 Other abnormalities of gait and mobility: Secondary | ICD-10-CM | POA: Diagnosis not present

## 2022-02-02 DIAGNOSIS — M6281 Muscle weakness (generalized): Secondary | ICD-10-CM | POA: Diagnosis not present

## 2022-02-02 DIAGNOSIS — Z9181 History of falling: Secondary | ICD-10-CM | POA: Diagnosis not present

## 2022-02-02 DIAGNOSIS — M25562 Pain in left knee: Secondary | ICD-10-CM | POA: Diagnosis not present

## 2022-02-07 ENCOUNTER — Ambulatory Visit (INDEPENDENT_AMBULATORY_CARE_PROVIDER_SITE_OTHER): Payer: Medicare Other | Admitting: Adult Health

## 2022-02-07 ENCOUNTER — Encounter: Payer: Self-pay | Admitting: Adult Health

## 2022-02-07 VITALS — BP 130/60 | HR 59 | Temp 98.4°F | Ht 63.0 in | Wt 127.0 lb

## 2022-02-07 DIAGNOSIS — B379 Candidiasis, unspecified: Secondary | ICD-10-CM

## 2022-02-07 MED ORDER — FLUCONAZOLE 150 MG PO TABS: ORAL_TABLET | ORAL | 0 refills | Status: DC

## 2022-02-07 NOTE — Progress Notes (Signed)
? ?Subjective:  ? ? Patient ID: Sheila Wolf, female    DOB: 1934/01/01, 88 y.o.   MRN: [id number] ? 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 ? ?She presents to the office today for an acute issue. Her symptoms started 10 days ago  ? ?Symptoms include that of burning with and without  urination and red burning/itching irritation around her vagina  ? ?Denies frequency,urgency,low back pain,fevers or chills.  ? ? ?Review of Systems ?See HPI  ? ?Past Medical History:  ?Diagnosis Date  ? Anxiety and depression   ? Arthritis   ? Frequent headaches   ? GERD (gastroesophageal reflux disease)   ? Hypertension   ? Osteoporosis   ? 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 Urinary incontinence   ? ? ?Social History  ? ?Socioeconomic History  ? Marital status: Widowed  ?  Spouse name: Not on file  ? Number of children: Not on file  ? Years of education: Not on file  ? Highest education level: Not on file  ?Occupational History  ? Not on file  ?Tobacco Use  ? Smoking status: Former  ?  Years: 10.00  ?  Types: Cigarettes  ? Smokeless tobacco: Never  ?Vaping Use  ? Vaping Use: Never used  ?Substance and Sexual Activity  ? Alcohol use: Yes  ?  Alcohol/week: 10.0 standard drinks  ?  Types: 10 Glasses of wine per week  ? Drug use: No  ? Sexual activity: Never  ?Other Topics Concern  ? Not on file  ?Social History Narrative  ? Not on file  ? ?Social Determinants of Health  ? ?Financial Resource Strain: Low Risk   ? Difficulty of Paying Living Expenses: Not hard at all  ?Food Insecurity: No Food Insecurity  ? Worried About Charity fundraiser in the Last Year: Never true  ? Ran Out of Food in the Last Year: Never true  ?Transportation Needs: No Transportation Needs  ? Lack of Transportation (Medical): No  ? Lack of Transportation (Non-Medical):  No  ?Physical Activity: Not on file  ?Stress: No Stress Concern Present  ? Feeling of Stress : Not at all  ?Social Connections: Moderately Isolated  ? Frequency of Communication with Friends and Family: Three times a week  ? Frequency of Social Gatherings with Friends and Family: Three times a week  ? Attends Religious Services: 1 to 4 times per year  ? Active Member of Clubs or Organizations: No  ? Attends Archivist Meetings: Never  ? Marital Status: Widowed  ?Intimate Partner Violence: Not At Risk  ? Fear of Current or Ex-Partner: No  ? Emotionally Abused: No  ? Physically Abused: No  ? Sexually Abused: No  ? ? ?Past Surgical History:  ?Procedure Laterality Date  ? APPENDECTOMY    ? BUNIONECTOMY    ? CERVICAL SPINE SURGERY    ? SHOULDER SURGERY    ? TONSILLECTOMY AND ADENOIDECTOMY    ? ? ?Family History  ?Problem Relation Age of Onset  ? Arthritis Mother   ? Hearing loss Mother   ? Heart disease Mother   ? Hypertension Mother   ? Alcohol abuse Father   ? Early death Father   ? Heart disease Father   ? Stroke Father   ? Birth defects Brother   ? Heart disease Brother   ? ? ?Allergies  ?  Allergen Reactions  ? Penicillins Rash  ? ? ?Current Outpatient Medications on File Prior to Visit  ?Medication Sig Dispense Refill  ? amLODipine (NORVASC) 5 MG tablet Take 1 tablet (5 mg total) by mouth daily. 90 tablet 3  ? benzonatate (TESSALON PERLES) 100 MG capsule Take 1 capsule (100 mg total) by mouth 3 (three) times daily as needed. 20 capsule 0  ? butalbital-acetaminophen-caffeine (FIORICET) 50-325-40 MG tablet TAKE 1 TABLET BY MOUTH EVERY DAY 30 tablet 1  ? cetirizine (ZYRTEC) 10 MG tablet TAKE 1 TABLET BY MOUTH EVERY DAY 90 tablet 3  ? cycloSPORINE (RESTASIS) 0.05 % ophthalmic emulsion 1 drop 2 (two) times daily.    ? escitalopram (LEXAPRO) 20 MG tablet TAKE 1 TABLET BY MOUTH EVERY DAY 90 tablet 1  ? fluticasone (FLONASE) 50 MCG/ACT nasal spray Place 2 sprays into both nostrils daily. 16 g 6  ? Melatonin 5 MG  CAPS Take by mouth.    ? meloxicam (MOBIC) 15 MG tablet TAKE 1 TABLET BY MOUTH EVERY DAY 90 tablet 1  ? metoprolol succinate (TOPROL-XL) 50 MG 24 hr tablet TAKE 1 TABLET BY MOUTH EVERY DAY WITH OR IMMEDIATELY FOLLOWING A MEAL 90 tablet 1  ? nystatin-triamcinolone ointment (MYCOLOG) apply topically 2 TIMES DAILY 30 g 1  ? omeprazole (PRILOSEC) 20 MG capsule TAKE 1 CAPSULE BY MOUTH EVERY DAY 90 capsule 0  ? simvastatin (ZOCOR) 10 MG tablet TAKE 1 TABLET BY MOUTH AT BEDTIME 90 tablet 3  ? traMADol (ULTRAM) 50 MG tablet     ? ?No current facility-administered medications on file prior to visit.  ? ? ?BP 130/60 (BP Location: Left Arm, Cuff Size: Normal)   Pulse (!) 59   Temp 98.4 ?F (36.9 ?C) (Oral)   Ht '5\' 3"'$  (1.6 m)   Wt 127 lb (57.6 kg)   SpO2 98%   BMI 22.50 kg/m?  ? ? ?   ?Objective:  ? Physical Exam ?Vitals and nursing note reviewed.  ?Constitutional:   ?   Appearance: Normal appearance.  ?Cardiovascular:  ?   Rate and Rhythm: Normal rate and regular rhythm.  ?   Pulses: Normal pulses.  ?   Heart sounds: Normal heart sounds.  ?Pulmonary:  ?   Effort: Pulmonary effort is normal.  ?   Breath sounds: Normal breath sounds.  ?Abdominal:  ?   General: Abdomen is flat. Bowel sounds are normal.  ?   Palpations: Abdomen is soft.  ?Musculoskeletal:     ?   General: Normal range of motion.  ?Skin: ?   General: Skin is warm and dry.  ?   Capillary Refill: Capillary refill takes less than 2 seconds.  ?Neurological:  ?   General: No focal deficit present.  ?   Mental Status: She is alert and oriented to person, place, and time.  ?Psychiatric:     ?   Mood and Affect: Mood normal.     ?   Behavior: Behavior normal.     ?   Thought Content: Thought content normal.     ?   Judgment: Judgment normal.  ? ?   ?Assessment & Plan:  ? ?1. Yeast infection ?- Symptoms consistent with yeast dermatitis  ?- fluconazole (DIFLUCAN) 150 MG tablet; Take one tablet every three days  Dispense: 3 tablet; Refill: 0 ? ?Dorothyann Peng, NP ? ?

## 2022-02-07 NOTE — Patient Instructions (Addendum)
I think you have a yeast infection. We will treat this with a medication called Diflucan. Take one tablet every three days until the medicine is gone.  ?

## 2022-02-09 DIAGNOSIS — Z9181 History of falling: Secondary | ICD-10-CM | POA: Diagnosis not present

## 2022-02-09 DIAGNOSIS — M25562 Pain in left knee: Secondary | ICD-10-CM | POA: Diagnosis not present

## 2022-02-09 DIAGNOSIS — R2689 Other abnormalities of gait and mobility: Secondary | ICD-10-CM | POA: Diagnosis not present

## 2022-02-09 DIAGNOSIS — R2681 Unsteadiness on feet: Secondary | ICD-10-CM | POA: Diagnosis not present

## 2022-02-09 DIAGNOSIS — M6281 Muscle weakness (generalized): Secondary | ICD-10-CM | POA: Diagnosis not present

## 2022-02-13 DIAGNOSIS — R2689 Other abnormalities of gait and mobility: Secondary | ICD-10-CM | POA: Diagnosis not present

## 2022-02-13 DIAGNOSIS — R2681 Unsteadiness on feet: Secondary | ICD-10-CM | POA: Diagnosis not present

## 2022-02-13 DIAGNOSIS — Z9181 History of falling: Secondary | ICD-10-CM | POA: Diagnosis not present

## 2022-02-13 DIAGNOSIS — M6281 Muscle weakness (generalized): Secondary | ICD-10-CM | POA: Diagnosis not present

## 2022-02-13 DIAGNOSIS — M25562 Pain in left knee: Secondary | ICD-10-CM | POA: Diagnosis not present

## 2022-03-03 DIAGNOSIS — Z6823 Body mass index (BMI) 23.0-23.9, adult: Secondary | ICD-10-CM | POA: Diagnosis not present

## 2022-03-03 DIAGNOSIS — Z0142 Encounter for cervical smear to confirm findings of recent normal smear following initial abnormal smear: Secondary | ICD-10-CM | POA: Diagnosis not present

## 2022-03-03 DIAGNOSIS — Z01411 Encounter for gynecological examination (general) (routine) with abnormal findings: Secondary | ICD-10-CM | POA: Diagnosis not present

## 2022-03-03 DIAGNOSIS — N898 Other specified noninflammatory disorders of vagina: Secondary | ICD-10-CM | POA: Diagnosis not present

## 2022-03-03 DIAGNOSIS — Z01419 Encounter for gynecological examination (general) (routine) without abnormal findings: Secondary | ICD-10-CM | POA: Diagnosis not present

## 2022-03-03 DIAGNOSIS — L292 Pruritus vulvae: Secondary | ICD-10-CM | POA: Diagnosis not present

## 2022-03-03 DIAGNOSIS — Z124 Encounter for screening for malignant neoplasm of cervix: Secondary | ICD-10-CM | POA: Diagnosis not present

## 2022-03-03 DIAGNOSIS — N852 Hypertrophy of uterus: Secondary | ICD-10-CM | POA: Diagnosis not present

## 2022-03-03 DIAGNOSIS — R300 Dysuria: Secondary | ICD-10-CM | POA: Diagnosis not present

## 2022-03-03 DIAGNOSIS — Z1231 Encounter for screening mammogram for malignant neoplasm of breast: Secondary | ICD-10-CM | POA: Diagnosis not present

## 2022-03-10 ENCOUNTER — Other Ambulatory Visit: Payer: Self-pay | Admitting: Adult Health

## 2022-03-10 DIAGNOSIS — R519 Headache, unspecified: Secondary | ICD-10-CM

## 2022-03-10 NOTE — Telephone Encounter (Signed)
Okay for refill?  ? ? ?LOV 02-07-2022 ? ? ?Last Refill       ? ?butalbital-acetaminophen-caffeine (FIORICET) 50-325-40 MG tablet 30 tablet 1 12/27/2021  ? ?

## 2022-03-13 DIAGNOSIS — N852 Hypertrophy of uterus: Secondary | ICD-10-CM | POA: Diagnosis not present

## 2022-03-13 DIAGNOSIS — N950 Postmenopausal bleeding: Secondary | ICD-10-CM | POA: Diagnosis not present

## 2022-04-03 DIAGNOSIS — R928 Other abnormal and inconclusive findings on diagnostic imaging of breast: Secondary | ICD-10-CM | POA: Diagnosis not present

## 2022-04-03 DIAGNOSIS — R922 Inconclusive mammogram: Secondary | ICD-10-CM | POA: Diagnosis not present

## 2022-04-18 DIAGNOSIS — M25562 Pain in left knee: Secondary | ICD-10-CM | POA: Diagnosis not present

## 2022-04-18 DIAGNOSIS — M1712 Unilateral primary osteoarthritis, left knee: Secondary | ICD-10-CM | POA: Diagnosis not present

## 2022-04-18 DIAGNOSIS — R262 Difficulty in walking, not elsewhere classified: Secondary | ICD-10-CM | POA: Diagnosis not present

## 2022-04-18 DIAGNOSIS — M25462 Effusion, left knee: Secondary | ICD-10-CM | POA: Diagnosis not present

## 2022-04-27 ENCOUNTER — Other Ambulatory Visit: Payer: Self-pay | Admitting: Adult Health

## 2022-04-27 DIAGNOSIS — M25462 Effusion, left knee: Secondary | ICD-10-CM | POA: Diagnosis not present

## 2022-04-27 DIAGNOSIS — R262 Difficulty in walking, not elsewhere classified: Secondary | ICD-10-CM | POA: Diagnosis not present

## 2022-04-27 DIAGNOSIS — R2681 Unsteadiness on feet: Secondary | ICD-10-CM | POA: Diagnosis not present

## 2022-04-27 DIAGNOSIS — R2689 Other abnormalities of gait and mobility: Secondary | ICD-10-CM | POA: Diagnosis not present

## 2022-04-27 DIAGNOSIS — R519 Headache, unspecified: Secondary | ICD-10-CM

## 2022-04-27 DIAGNOSIS — M25562 Pain in left knee: Secondary | ICD-10-CM | POA: Diagnosis not present

## 2022-04-27 DIAGNOSIS — M1712 Unilateral primary osteoarthritis, left knee: Secondary | ICD-10-CM | POA: Diagnosis not present

## 2022-04-27 DIAGNOSIS — M6281 Muscle weakness (generalized): Secondary | ICD-10-CM | POA: Diagnosis not present

## 2022-05-01 DIAGNOSIS — M6281 Muscle weakness (generalized): Secondary | ICD-10-CM | POA: Diagnosis not present

## 2022-05-01 DIAGNOSIS — R2681 Unsteadiness on feet: Secondary | ICD-10-CM | POA: Diagnosis not present

## 2022-05-01 DIAGNOSIS — R262 Difficulty in walking, not elsewhere classified: Secondary | ICD-10-CM | POA: Diagnosis not present

## 2022-05-01 DIAGNOSIS — M25562 Pain in left knee: Secondary | ICD-10-CM | POA: Diagnosis not present

## 2022-05-01 DIAGNOSIS — M1712 Unilateral primary osteoarthritis, left knee: Secondary | ICD-10-CM | POA: Diagnosis not present

## 2022-05-01 DIAGNOSIS — M25462 Effusion, left knee: Secondary | ICD-10-CM | POA: Diagnosis not present

## 2022-05-03 DIAGNOSIS — Z23 Encounter for immunization: Secondary | ICD-10-CM | POA: Diagnosis not present

## 2022-05-04 DIAGNOSIS — M6281 Muscle weakness (generalized): Secondary | ICD-10-CM | POA: Diagnosis not present

## 2022-05-04 DIAGNOSIS — M1712 Unilateral primary osteoarthritis, left knee: Secondary | ICD-10-CM | POA: Diagnosis not present

## 2022-05-04 DIAGNOSIS — M25462 Effusion, left knee: Secondary | ICD-10-CM | POA: Diagnosis not present

## 2022-05-04 DIAGNOSIS — M25562 Pain in left knee: Secondary | ICD-10-CM | POA: Diagnosis not present

## 2022-05-04 DIAGNOSIS — R2681 Unsteadiness on feet: Secondary | ICD-10-CM | POA: Diagnosis not present

## 2022-05-04 DIAGNOSIS — R262 Difficulty in walking, not elsewhere classified: Secondary | ICD-10-CM | POA: Diagnosis not present

## 2022-05-09 DIAGNOSIS — M1712 Unilateral primary osteoarthritis, left knee: Secondary | ICD-10-CM | POA: Diagnosis not present

## 2022-05-09 DIAGNOSIS — M25462 Effusion, left knee: Secondary | ICD-10-CM | POA: Diagnosis not present

## 2022-05-09 DIAGNOSIS — M25562 Pain in left knee: Secondary | ICD-10-CM | POA: Diagnosis not present

## 2022-05-09 DIAGNOSIS — R2681 Unsteadiness on feet: Secondary | ICD-10-CM | POA: Diagnosis not present

## 2022-05-09 DIAGNOSIS — M6281 Muscle weakness (generalized): Secondary | ICD-10-CM | POA: Diagnosis not present

## 2022-05-09 DIAGNOSIS — R262 Difficulty in walking, not elsewhere classified: Secondary | ICD-10-CM | POA: Diagnosis not present

## 2022-05-16 DIAGNOSIS — M1712 Unilateral primary osteoarthritis, left knee: Secondary | ICD-10-CM | POA: Diagnosis not present

## 2022-05-16 DIAGNOSIS — M6281 Muscle weakness (generalized): Secondary | ICD-10-CM | POA: Diagnosis not present

## 2022-05-16 DIAGNOSIS — R262 Difficulty in walking, not elsewhere classified: Secondary | ICD-10-CM | POA: Diagnosis not present

## 2022-05-16 DIAGNOSIS — M25562 Pain in left knee: Secondary | ICD-10-CM | POA: Diagnosis not present

## 2022-05-16 DIAGNOSIS — M25462 Effusion, left knee: Secondary | ICD-10-CM | POA: Diagnosis not present

## 2022-05-16 DIAGNOSIS — R2681 Unsteadiness on feet: Secondary | ICD-10-CM | POA: Diagnosis not present

## 2022-05-18 DIAGNOSIS — M25462 Effusion, left knee: Secondary | ICD-10-CM | POA: Diagnosis not present

## 2022-05-18 DIAGNOSIS — M6281 Muscle weakness (generalized): Secondary | ICD-10-CM | POA: Diagnosis not present

## 2022-05-18 DIAGNOSIS — M25562 Pain in left knee: Secondary | ICD-10-CM | POA: Diagnosis not present

## 2022-05-18 DIAGNOSIS — M1712 Unilateral primary osteoarthritis, left knee: Secondary | ICD-10-CM | POA: Diagnosis not present

## 2022-05-18 DIAGNOSIS — R262 Difficulty in walking, not elsewhere classified: Secondary | ICD-10-CM | POA: Diagnosis not present

## 2022-05-18 DIAGNOSIS — R2681 Unsteadiness on feet: Secondary | ICD-10-CM | POA: Diagnosis not present

## 2022-05-25 DIAGNOSIS — R2681 Unsteadiness on feet: Secondary | ICD-10-CM | POA: Diagnosis not present

## 2022-05-25 DIAGNOSIS — M6281 Muscle weakness (generalized): Secondary | ICD-10-CM | POA: Diagnosis not present

## 2022-05-25 DIAGNOSIS — R2689 Other abnormalities of gait and mobility: Secondary | ICD-10-CM | POA: Diagnosis not present

## 2022-05-25 DIAGNOSIS — M25462 Effusion, left knee: Secondary | ICD-10-CM | POA: Diagnosis not present

## 2022-05-25 DIAGNOSIS — R262 Difficulty in walking, not elsewhere classified: Secondary | ICD-10-CM | POA: Diagnosis not present

## 2022-05-25 DIAGNOSIS — M1712 Unilateral primary osteoarthritis, left knee: Secondary | ICD-10-CM | POA: Diagnosis not present

## 2022-05-25 DIAGNOSIS — M25562 Pain in left knee: Secondary | ICD-10-CM | POA: Diagnosis not present

## 2022-05-30 DIAGNOSIS — M25562 Pain in left knee: Secondary | ICD-10-CM | POA: Diagnosis not present

## 2022-05-30 DIAGNOSIS — M25462 Effusion, left knee: Secondary | ICD-10-CM | POA: Diagnosis not present

## 2022-05-30 DIAGNOSIS — M6281 Muscle weakness (generalized): Secondary | ICD-10-CM | POA: Diagnosis not present

## 2022-05-30 DIAGNOSIS — M1712 Unilateral primary osteoarthritis, left knee: Secondary | ICD-10-CM | POA: Diagnosis not present

## 2022-05-30 DIAGNOSIS — R262 Difficulty in walking, not elsewhere classified: Secondary | ICD-10-CM | POA: Diagnosis not present

## 2022-05-30 DIAGNOSIS — R2681 Unsteadiness on feet: Secondary | ICD-10-CM | POA: Diagnosis not present

## 2022-06-01 ENCOUNTER — Other Ambulatory Visit: Payer: Self-pay | Admitting: Adult Health

## 2022-06-01 DIAGNOSIS — M6281 Muscle weakness (generalized): Secondary | ICD-10-CM | POA: Diagnosis not present

## 2022-06-01 DIAGNOSIS — M25562 Pain in left knee: Secondary | ICD-10-CM | POA: Diagnosis not present

## 2022-06-01 DIAGNOSIS — R262 Difficulty in walking, not elsewhere classified: Secondary | ICD-10-CM | POA: Diagnosis not present

## 2022-06-01 DIAGNOSIS — M25462 Effusion, left knee: Secondary | ICD-10-CM | POA: Diagnosis not present

## 2022-06-01 DIAGNOSIS — M1712 Unilateral primary osteoarthritis, left knee: Secondary | ICD-10-CM | POA: Diagnosis not present

## 2022-06-01 DIAGNOSIS — Z760 Encounter for issue of repeat prescription: Secondary | ICD-10-CM

## 2022-06-01 DIAGNOSIS — R2681 Unsteadiness on feet: Secondary | ICD-10-CM | POA: Diagnosis not present

## 2022-06-06 DIAGNOSIS — R262 Difficulty in walking, not elsewhere classified: Secondary | ICD-10-CM | POA: Diagnosis not present

## 2022-06-06 DIAGNOSIS — M25562 Pain in left knee: Secondary | ICD-10-CM | POA: Diagnosis not present

## 2022-06-06 DIAGNOSIS — M6281 Muscle weakness (generalized): Secondary | ICD-10-CM | POA: Diagnosis not present

## 2022-06-06 DIAGNOSIS — M25462 Effusion, left knee: Secondary | ICD-10-CM | POA: Diagnosis not present

## 2022-06-06 DIAGNOSIS — M1712 Unilateral primary osteoarthritis, left knee: Secondary | ICD-10-CM | POA: Diagnosis not present

## 2022-06-06 DIAGNOSIS — R2681 Unsteadiness on feet: Secondary | ICD-10-CM | POA: Diagnosis not present

## 2022-06-08 DIAGNOSIS — L292 Pruritus vulvae: Secondary | ICD-10-CM | POA: Diagnosis not present

## 2022-06-16 DIAGNOSIS — M25562 Pain in left knee: Secondary | ICD-10-CM | POA: Diagnosis not present

## 2022-06-16 DIAGNOSIS — R262 Difficulty in walking, not elsewhere classified: Secondary | ICD-10-CM | POA: Diagnosis not present

## 2022-06-16 DIAGNOSIS — M1712 Unilateral primary osteoarthritis, left knee: Secondary | ICD-10-CM | POA: Diagnosis not present

## 2022-06-16 DIAGNOSIS — M6281 Muscle weakness (generalized): Secondary | ICD-10-CM | POA: Diagnosis not present

## 2022-06-16 DIAGNOSIS — R2681 Unsteadiness on feet: Secondary | ICD-10-CM | POA: Diagnosis not present

## 2022-06-16 DIAGNOSIS — M25462 Effusion, left knee: Secondary | ICD-10-CM | POA: Diagnosis not present

## 2022-06-21 DIAGNOSIS — M25462 Effusion, left knee: Secondary | ICD-10-CM | POA: Diagnosis not present

## 2022-06-21 DIAGNOSIS — M1712 Unilateral primary osteoarthritis, left knee: Secondary | ICD-10-CM | POA: Diagnosis not present

## 2022-06-21 DIAGNOSIS — R262 Difficulty in walking, not elsewhere classified: Secondary | ICD-10-CM | POA: Diagnosis not present

## 2022-06-21 DIAGNOSIS — R2681 Unsteadiness on feet: Secondary | ICD-10-CM | POA: Diagnosis not present

## 2022-06-21 DIAGNOSIS — R2689 Other abnormalities of gait and mobility: Secondary | ICD-10-CM | POA: Diagnosis not present

## 2022-06-21 DIAGNOSIS — M6281 Muscle weakness (generalized): Secondary | ICD-10-CM | POA: Diagnosis not present

## 2022-06-21 DIAGNOSIS — M25562 Pain in left knee: Secondary | ICD-10-CM | POA: Diagnosis not present

## 2022-06-26 ENCOUNTER — Ambulatory Visit (INDEPENDENT_AMBULATORY_CARE_PROVIDER_SITE_OTHER): Payer: Medicare Other | Admitting: Family Medicine

## 2022-06-26 ENCOUNTER — Encounter: Payer: Self-pay | Admitting: Family Medicine

## 2022-06-26 ENCOUNTER — Other Ambulatory Visit: Payer: Self-pay | Admitting: Adult Health

## 2022-06-26 VITALS — BP 112/56 | HR 68 | Temp 98.6°F | Wt 130.8 lb

## 2022-06-26 DIAGNOSIS — M25462 Effusion, left knee: Secondary | ICD-10-CM | POA: Diagnosis not present

## 2022-06-26 DIAGNOSIS — L0100 Impetigo, unspecified: Secondary | ICD-10-CM | POA: Diagnosis not present

## 2022-06-26 DIAGNOSIS — R2681 Unsteadiness on feet: Secondary | ICD-10-CM | POA: Diagnosis not present

## 2022-06-26 DIAGNOSIS — M25562 Pain in left knee: Secondary | ICD-10-CM | POA: Diagnosis not present

## 2022-06-26 DIAGNOSIS — R519 Headache, unspecified: Secondary | ICD-10-CM

## 2022-06-26 DIAGNOSIS — L309 Dermatitis, unspecified: Secondary | ICD-10-CM | POA: Diagnosis not present

## 2022-06-26 DIAGNOSIS — M1712 Unilateral primary osteoarthritis, left knee: Secondary | ICD-10-CM | POA: Diagnosis not present

## 2022-06-26 DIAGNOSIS — F419 Anxiety disorder, unspecified: Secondary | ICD-10-CM

## 2022-06-26 DIAGNOSIS — M6281 Muscle weakness (generalized): Secondary | ICD-10-CM | POA: Diagnosis not present

## 2022-06-26 DIAGNOSIS — I10 Essential (primary) hypertension: Secondary | ICD-10-CM

## 2022-06-26 DIAGNOSIS — R262 Difficulty in walking, not elsewhere classified: Secondary | ICD-10-CM | POA: Diagnosis not present

## 2022-06-26 MED ORDER — MUPIROCIN 2 % EX OINT: 1.0000 | TOPICAL_OINTMENT | Freq: Two times a day (BID) | CUTANEOUS | 0 refills | Status: AC

## 2022-06-26 NOTE — Patient Instructions (Signed)
Prescription for cream, mupirocin was sent to your pharmacy.  You can use this on the area underneath your nose twice a day for the next 5 days.  You can use over-the-counter cortisone 1% cream once a day for the next 5 days on the areas on your cheeks if they become more red or itchy.  You can also use an over-the-counter antihistamine pill daily for increased itching.  If you notice other spots on her body let us know.

## 2022-06-26 NOTE — Progress Notes (Signed)
Subjective:    Patient ID: Sheila Wolf, female    DOB: [birth year], 88 y.o.   MRN: [id number]  Chief Complaint  Patient presents with   Rash    Red spots on right side of face. Noticed this morning, no pain, no swelling, no burning. Did not eat or do anything out of the ordinary    HPI Patient was seen today for acute concern.  Patient endorses intermittent thematous rash on face and underneath nose x1 day.  Patient states that area under her nose started first then she noticed a smaller lighter red spots on her cheeks.  Patient can tell something is on her face and starting to feel like she wants to scratch.  Patient denies changes in soaps, lotions, or detergents.  Denies changes in foods or rash on other areas of body.  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[facility])    Urinary incontinence     Allergies  Allergen Reactions   Penicillins Rash    ROS General: Denies fever, chills, night sweats, changes in weight, changes in appetite HEENT: Denies headaches, ear pain, changes in vision, rhinorrhea, sore throat CV: Denies CP, palpitations, SOB, orthopnea Pulm: Denies SOB, cough, wheezing GI: Denies abdominal pain, nausea, vomiting, diarrhea, constipation GU: Denies dysuria, hematuria, frequency, vaginal discharge Msk: Denies muscle cramps, joint pains Neuro: Denies weakness, numbness, tingling Skin: Denies rashes, bruising  +rash on face Psych: Denies depression, anxiety, hallucinations      Objective:    Blood pressure (!) 112/56, pulse 68, temperature 98.6 F (37 C), temperature source Oral, weight 130 lb 12.8 oz (59.3 kg), SpO2 96 %.  Gen. Pleasant, well-nourished, in no distress, normal affect   HEENT: [place]/AT, face symmetric, conjunctiva clear, no scleral icterus, PERRLA, EOMI, nares patent without  drainage Lungs: no accessory muscle use Cardiovascular: RRR, no peripheral edema Musculoskeletal: No deformities, no cyanosis or clubbing, normal tone Neuro:  A&Ox3, CN II-XII intact, normal gait Skin:  Warm, dry, intact.  Faint erythematous papules on b/l cheeks.  Erythematous crusted area underneath L nares.   Wt Readings from Last 3 Encounters:  02/07/22 127 lb (57.6 kg)  01/27/22 127 lb (57.6 kg)  12/27/21 126 lb (57.2 kg)    Lab Results  Component Value Date   WBC 5.6 01/18/2021   HGB 12.7 01/18/2021   HCT 38.3 01/18/2021   PLT 133.0 (L) 01/18/2021   GLUCOSE 98 01/18/2021   CHOL 240 (H) 01/18/2021   TRIG 71.0 01/18/2021   HDL 117.40 01/18/2021   LDLCALC 108 (H) 01/18/2021   ALT 13 01/18/2021   AST 19 01/18/2021   NA 139 01/18/2021   K 5.0 01/18/2021   CL 103 01/18/2021   CREATININE 0.82 01/18/2021   BUN 24 (H) 01/18/2021   CO2 30 01/18/2021   TSH 1.94 01/18/2021    Assessment/Plan:  Dermatitis  Impetigo - Plan: mupirocin ointment (BACTROBAN) 2 %  Discussed possible causes of dermatitis.  Underneath nose concerning for impetigo.  Will start mupirocin twice daily x5 days.  Can use 1% cortisone cream on cheeks as needed.  Can use OTC antihistamine for pruritis.  Use gentle soaps, lotions, detergents.  Follow-up with PCP as needed for continued or worsening symptoms   Grier Mitts, MD

## 2022-06-28 ENCOUNTER — Telehealth: Payer: Self-pay

## 2022-06-28 NOTE — Telephone Encounter (Signed)
Contacted patient on preferred number listed in notes for scheduled AWV. Patient stated unable to complete visit today and would like a call back to set up rescheduling.

## 2022-06-28 NOTE — Telephone Encounter (Signed)
Patient need to schedule a physical for more refills. lvm

## 2022-06-29 ENCOUNTER — Other Ambulatory Visit: Payer: Self-pay | Admitting: Family Medicine

## 2022-06-29 ENCOUNTER — Other Ambulatory Visit: Payer: Self-pay | Admitting: Adult Health

## 2022-06-29 DIAGNOSIS — R519 Headache, unspecified: Secondary | ICD-10-CM

## 2022-06-30 ENCOUNTER — Telehealth: Payer: Self-pay | Admitting: Adult Health

## 2022-06-30 NOTE — Telephone Encounter (Signed)
Pharmacy seeking clarity on prescription denial butalbital-acetaminophen-caffeine (FIORICET) 50-325-40 MG tablet

## 2022-07-03 ENCOUNTER — Telehealth: Payer: Self-pay | Admitting: Adult Health

## 2022-07-03 DIAGNOSIS — M25562 Pain in left knee: Secondary | ICD-10-CM | POA: Diagnosis not present

## 2022-07-03 DIAGNOSIS — M1712 Unilateral primary osteoarthritis, left knee: Secondary | ICD-10-CM | POA: Diagnosis not present

## 2022-07-03 DIAGNOSIS — M25462 Effusion, left knee: Secondary | ICD-10-CM | POA: Diagnosis not present

## 2022-07-03 DIAGNOSIS — R2681 Unsteadiness on feet: Secondary | ICD-10-CM | POA: Diagnosis not present

## 2022-07-03 DIAGNOSIS — R262 Difficulty in walking, not elsewhere classified: Secondary | ICD-10-CM | POA: Diagnosis not present

## 2022-07-03 DIAGNOSIS — M6281 Muscle weakness (generalized): Secondary | ICD-10-CM | POA: Diagnosis not present

## 2022-07-03 NOTE — Telephone Encounter (Signed)
Last refill 04/27/22 Last office visit with Dr Volanda Napoleon 06/26/22 Last office visit 02/07/22

## 2022-07-03 NOTE — Telephone Encounter (Signed)
Duplicate message. See hone encounter from 07/03/22 that has been sent to provider.

## 2022-07-03 NOTE — Telephone Encounter (Signed)
Sheila Wolf at [facility] a refill of the  butalbital-acetaminophen-caffeine (FIORICET) 50-325-40 MG tablet   Last OV:  06/26/2022  [facility] - [place], [place] - [address] Phone:  [phone]  Fax:  [phone]      Please advise.

## 2022-07-10 ENCOUNTER — Ambulatory Visit (INDEPENDENT_AMBULATORY_CARE_PROVIDER_SITE_OTHER): Payer: Medicare Other

## 2022-07-10 VITALS — Ht 63.0 in | Wt 130.0 lb

## 2022-07-10 DIAGNOSIS — Z0000 Encounter for general adult medical examination without abnormal findings: Secondary | ICD-10-CM

## 2022-07-10 NOTE — Patient Instructions (Signed)
Sheila Wolf , Thank you for taking time to come for your Medicare Wellness Visit. I appreciate your ongoing commitment to your health goals. Please review the following plan we discussed and let me know if I can assist you in the future.   Screening recommendations/referrals: Colonoscopy: not required Mammogram: not required Bone Density: completed 11/03/2019 Recommended yearly ophthalmology/optometry visit for glaucoma screening and checkup Recommended yearly dental visit for hygiene and checkup  Vaccinations: Influenza vaccine: due Pneumococcal vaccine: completed 11/30/2018 Tdap vaccine: n/a Shingles vaccine: completed   Covid-19: 08/25/2020, 01/01/2020, 12/11/2019  Advanced directives: Please bring a copy of your POA (Power of Attorney) and/or Living Will to your next appointment.   Conditions/risks identified: none  Next appointment: Follow up in one year for your annual wellness visit    Preventive Care 65 Years and Older, Female Preventive care refers to lifestyle choices and visits with your health care provider that can promote health and wellness. What does preventive care include? A yearly physical exam. This is also called an annual well check. Dental exams once or twice a year. Routine eye exams. Ask your health care provider how often you should have your eyes checked. Personal lifestyle choices, including: Daily care of your teeth and gums. Regular physical activity. Eating a healthy diet. Avoiding tobacco and drug use. Limiting alcohol use. Practicing safe sex. Taking low-dose aspirin every day. Taking vitamin and mineral supplements as recommended by your health care provider. What happens during an annual well check? The services and screenings done by your health care provider during your annual well check will depend on your age, overall health, lifestyle risk factors, and family history of disease. Counseling  Your health care provider may ask you questions  about your: Alcohol use. Tobacco use. Drug use. Emotional well-being. Home and relationship well-being. Sexual activity. Eating habits. History of falls. Memory and ability to understand (cognition). Work and work Statistician. Reproductive health. Screening  You may have the following tests or measurements: Height, weight, and BMI. Blood pressure. Lipid and cholesterol levels. These may be checked every 5 years, or more frequently if you are over 55 years old. Skin check. Lung cancer screening. You may have this screening every year starting at age 26 if you have a 30-pack-year history of smoking and currently smoke or have quit within the past 15 years. Fecal occult blood test (FOBT) of the stool. You may have this test every year starting at age 60. Flexible sigmoidoscopy or colonoscopy. You may have a sigmoidoscopy every 5 years or a colonoscopy every 10 years starting at age 34. Hepatitis C blood test. Hepatitis B blood test. Sexually transmitted disease (STD) testing. Diabetes screening. This is done by checking your blood sugar (glucose) after you have not eaten for a while (fasting). You may have this done every 1-3 years. Bone density scan. This is done to screen for osteoporosis. You may have this done starting at age 18. Mammogram. This may be done every 1-2 years. Talk to your health care provider about how often you should have regular mammograms. Talk with your health care provider about your test results, treatment options, and if necessary, the need for more tests. Vaccines  Your health care provider may recommend certain vaccines, such as: Influenza vaccine. This is recommended every year. Tetanus, diphtheria, and acellular pertussis (Tdap, Td) vaccine. You may need a Td booster every 10 years. Zoster vaccine. You may need this after age 32. Pneumococcal 13-valent conjugate (PCV13) vaccine. One dose is recommended after age  86. Pneumococcal polysaccharide (PPSV23)  vaccine. One dose is recommended after age 25. Talk to your health care provider about which screenings and vaccines you need and how often you need them. This information is not intended to replace advice given to you by your health care provider. Make sure you discuss any questions you have with your health care provider. Document Released: 12/03/2015 Document Revised: 07/26/2016 Document Reviewed: 09/07/2015 Elsevier Interactive Patient Education  2017 [place] Prevention in the Home Falls can cause injuries. They can happen to people of all ages. There are many things you can do to make your home safe and to help prevent falls. What can I do on the outside of my home? Regularly fix the edges of walkways and driveways and fix any cracks. Remove anything that might make you trip as you walk through a door, such as a raised step or threshold. Trim any bushes or trees on the path to your home. Use bright outdoor lighting. Clear any walking paths of anything that might make someone trip, such as rocks or tools. Regularly check to see if handrails are loose or broken. Make sure that both sides of any steps have handrails. Any raised decks and porches should have guardrails on the edges. Have any leaves, snow, or ice cleared regularly. Use sand or salt on walking paths during winter. Clean up any spills in your garage right away. This includes oil or grease spills. What can I do in the bathroom? Use night lights. Install grab bars by the toilet and in the tub and shower. Do not use towel bars as grab bars. Use non-skid mats or decals in the tub or shower. If you need to sit down in the shower, use a plastic, non-slip stool. Keep the floor dry. Clean up any water that spills on the floor as soon as it happens. Remove soap buildup in the tub or shower regularly. Attach bath mats securely with double-sided non-slip rug tape. Do not have throw rugs and other things on the floor that  can make you trip. What can I do in the bedroom? Use night lights. Make sure that you have a light by your bed that is easy to reach. Do not use any sheets or blankets that are too big for your bed. They should not hang down onto the floor. Have a firm chair that has side arms. You can use this for support while you get dressed. Do not have throw rugs and other things on the floor that can make you trip. What can I do in the kitchen? Clean up any spills right away. Avoid walking on wet floors. Keep items that you use a lot in easy-to-reach places. If you need to reach something above you, use a strong step stool that has a grab bar. Keep electrical cords out of the way. Do not use floor polish or wax that makes floors slippery. If you must use wax, use non-skid floor wax. Do not have throw rugs and other things on the floor that can make you trip. What can I do with my stairs? Do not leave any items on the stairs. Make sure that there are handrails on both sides of the stairs and use them. Fix handrails that are broken or loose. Make sure that handrails are as long as the stairways. Check any carpeting to make sure that it is firmly attached to the stairs. Fix any carpet that is loose or worn. Avoid having throw rugs at  the top or bottom of the stairs. If you do have throw rugs, attach them to the floor with carpet tape. Make sure that you have a light switch at the top of the stairs and the bottom of the stairs. If you do not have them, ask someone to add them for you. What else can I do to help prevent falls? Wear shoes that: Do not have high heels. Have rubber bottoms. Are comfortable and fit you well. Are closed at the toe. Do not wear sandals. If you use a stepladder: Make sure that it is fully opened. Do not climb a closed stepladder. Make sure that both sides of the stepladder are locked into place. Ask someone to hold it for you, if possible. Clearly mark and make sure that you  can see: Any grab bars or handrails. First and last steps. Where the edge of each step is. Use tools that help you move around (mobility aids) if they are needed. These include: Canes. Walkers. Scooters. Crutches. Turn on the lights when you go into a dark area. Replace any light bulbs as soon as they burn out. Set up your furniture so you have a clear path. Avoid moving your furniture around. If any of your floors are uneven, fix them. If there are any pets around you, be aware of where they are. Review your medicines with your doctor. Some medicines can make you feel dizzy. This can increase your chance of falling. Ask your doctor what other things that you can do to help prevent falls. This information is not intended to replace advice given to you by your health care provider. Make sure you discuss any questions you have with your health care provider. Document Released: 09/02/2009 Document Revised: 04/13/2016 Document Reviewed: 12/11/2014 Elsevier Interactive Patient Education  2017 [employer].

## 2022-07-10 NOTE — Progress Notes (Signed)
I connected with Sheila Wolf today by telephone and verified that I am speaking with the correct person using two identifiers. Location patient: home Location provider: work Persons participating in the virtual visit: Sheila Wolf, Glenna Durand LPN.   I discussed the limitations, risks, security and privacy concerns of performing an evaluation and management service by telephone and the availability of in person appointments. I also discussed with the patient that there may be a patient responsible charge related to this service. The patient expressed understanding and verbally consented to this telephonic visit.    Interactive audio and video telecommunications were attempted between this provider and patient, however failed, due to patient having technical difficulties OR patient did not have access to video capability.  We continued and completed visit with audio only.     Vital signs may be patient reported or missing.  Subjective:   Sheila Wolf is a 88 y.o. female who presents for Medicare Annual (Subsequent) preventive examination.  Review of Systems     Cardiac Risk Factors include: advanced age (>13mn, >>18women);hypertension     Objective:    Today's Vitals   07/10/22 1027 07/10/22 1028  Weight: 130 lb (59 kg)   Height: '5\' 3"'$  (1.6 m)   PainSc:  6    Body mass index is 23.03 kg/m.     07/10/2022   10:35 AM 06/21/2021   10:37 AM 08/13/2018   10:42 PM 08/13/2018    4:37 PM  Advanced Directives  Does Patient Have a Medical Advance Directive? Yes Yes No No  Type of [place]    Copy of [place]? No - copy requested No - copy requested    Would patient like information on creating a medical advance directive?    No - Patient declined    Current Medications (verified) Outpatient Encounter Medications as of 07/10/2022   Medication Sig   amLODipine (NORVASC) 5 MG tablet Take 1 tablet (5 mg total) by mouth daily.   butalbital-acetaminophen-caffeine (FIORICET) 50-325-40 MG tablet TAKE 1 TABLET BY MOUTH 4 TIMES DAILY AS NEEDED FOR HEADACHE   cetirizine (ZYRTEC) 10 MG tablet TAKE 1 TABLET BY MOUTH EVERY DAY   escitalopram (LEXAPRO) 20 MG tablet TAKE 1 TABLET BY MOUTH EVERY DAY   fluticasone (FLONASE) 50 MCG/ACT nasal spray Place 2 sprays into both nostrils daily.   Melatonin 5 MG CAPS Take by mouth.   meloxicam (MOBIC) 15 MG tablet TAKE 1 TABLET BY MOUTH EVERY DAY   metoprolol succinate (TOPROL-XL) 50 MG 24 hr tablet TAKE 1 TABLET BY MOUTH EVERY DAY WITH OR IMMEDIATELY FOLLOWING A MEAL   nystatin-triamcinolone ointment (MYCOLOG) apply topically 2 TIMES DAILY   omeprazole (PRILOSEC) 20 MG capsule TAKE 1 CAPSULE BY MOUTH EVERY DAY   cycloSPORINE (RESTASIS) 0.05 % ophthalmic emulsion 1 drop 2 (two) times daily. (Patient not taking: Reported on 07/10/2022)   fluconazole (DIFLUCAN) 150 MG tablet Take one tablet every three days (Patient not taking: Reported on 06/26/2022)   simvastatin (ZOCOR) 10 MG tablet TAKE 1 TABLET BY MOUTH AT BEDTIME (Patient not taking: Reported on 06/26/2022)   traMADol (ULTRAM) 50 MG tablet  (Patient not taking: Reported on 06/26/2022)   No facility-administered encounter medications on file as of 07/10/2022.    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[place])    Urinary incontinence    Past Surgical History:  Procedure Laterality Date   APPENDECTOMY     BUNIONECTOMY     CERVICAL SPINE SURGERY     SHOULDER SURGERY     TONSILLECTOMY AND ADENOIDECTOMY     Family History  Problem Relation Age of Onset   Arthritis Mother    Hearing loss Mother    Heart disease Mother    Hypertension Mother     Alcohol abuse Father    Early death Father    Heart disease Father    Stroke Father    Birth defects Brother    Heart disease Brother    Social History   Socioeconomic History   Marital status: Widowed    Spouse name: Not on file   Number of children: Not on file   Years of education: Not on file   Highest education level: Not on file  Occupational History   Not on file  Tobacco Use   Smoking status: Former    Years: 10.00    Types: Cigarettes   Smokeless tobacco: Never  Vaping Use   Vaping Use: Never used  Substance and Sexual Activity   Alcohol use: Yes    Alcohol/week: 10.0 standard drinks of alcohol    Types: 10 Glasses of wine per week   Drug use: No   Sexual activity: Not Currently  Other Topics Concern   Not on file  Social History Narrative   Not on file   Social Determinants of Health   Financial Resource Strain: Low Risk  (07/10/2022)   Overall Financial Resource Strain (CARDIA)    Difficulty of Paying Living Expenses: Not hard at all  Food Insecurity: No Food Insecurity (07/10/2022)   Hunger Vital Sign    Worried About Running Out of Food in the Last Year: Never true    Ran Out of Food in the Last Year: Never true  Transportation Needs: No Transportation Needs (07/10/2022)   PRAPARE - Hydrologist (Medical): No    Lack of Transportation (Non-Medical): No  Physical Activity: Inactive (07/10/2022)   Exercise Vital Sign    Days of Exercise per Week: 0 days    Minutes of Exercise per Session: 0 min  Stress: No Stress Concern Present (07/10/2022)   [place]    Feeling of Stress : Not at all  Social Connections: Moderately Isolated (06/21/2021)   Social Connection and Isolation Panel [NHANES]    Frequency of Communication with Friends and Family: Three times a week    Frequency of Social Gatherings with Friends and Family: Three times a week    Attends Religious  Services: 1 to 4 times per year    Active Member of Clubs or Organizations: No    Attends Archivist Meetings: Never    Marital Status: Widowed    Tobacco Counseling Counseling given: Not Answered   Clinical Intake:  Pre-visit preparation completed: Yes  Pain : 0-10 Pain Score: 6  Pain Type: Chronic pain Pain Location: Knee Pain Orientation: Left Pain Descriptors / Indicators: Aching Pain Onset: More than a month ago Pain Frequency: Constant     Nutritional Status: BMI of 19-24  Normal Diabetes: No  How often do you need to have someone help you when you read instructions, pamphlets, or other written materials from your  doctor or pharmacy?: 1 - Never What is the last grade level you completed in school?: masters degree  Diabetic? no  Interpreter Needed?: No  Information entered by :: NAllen LPN   Activities of Daily Living    07/10/2022   10:37 AM  In your present state of health, do you have any difficulty performing the following activities:  Hearing? 0  Vision? 0  Difficulty concentrating or making decisions? 1  Walking or climbing stairs? 1  Dressing or bathing? 0  Doing errands, shopping? 0  Preparing Food and eating ? N  Using the Toilet? N  In the past six months, have you accidently leaked urine? Y  Do you have problems with loss of bowel control? N  Managing your Medications? N  Managing your Finances? N  Housekeeping or managing your Housekeeping? N    Patient Care Team: Dorothyann Peng, NP as PCP - General (Family Medicine) Paralee Cancel, MD as Consulting Physician (Orthopedic Surgery)  Indicate any recent Medical Services you may have received from other than [facility] providers in the past year (date may be approximate).     Assessment:   This is a routine wellness examination for [place].  Hearing/Vision screen Vision Screening - Comments:: Regular eye exams, [facility]  Dietary issues and exercise activities  discussed: Current Exercise Habits: The patient does not participate in regular exercise at present   Goals Addressed             This Visit's Progress    Patient Stated       07/10/2022, no goals       Depression Screen    07/10/2022   10:37 AM 06/26/2022    2:42 PM 02/07/2022    2:26 PM 06/21/2021   10:37 AM 06/21/2021   10:35 AM 09/14/2020    1:13 PM  PHQ 2/9 Scores  PHQ - 2 Score 0 0 0 0 0 0  PHQ- 9 Score  0 0   0    Fall Risk    07/10/2022   10:36 AM 06/26/2022    2:42 PM 02/07/2022    2:26 PM 06/21/2021   10:37 AM 09/14/2020    1:13 PM  [place] in the past year? 0 0 1 0 1  Number falls in past yr: 0 0 0 0 1  Injury with Fall? 0 0 0 0 1  Risk for fall due to : Medication side effect No Fall Risks No Fall Risks  Impaired balance/gait  Follow up Falls evaluation completed;Education provided;Falls prevention discussed Falls evaluation completed Falls evaluation completed Falls evaluation completed     FALL RISK PREVENTION PERTAINING TO THE HOME:  Any stairs in or around the home? Yes  If so, are there any without handrails? No  Home free of loose throw rugs in walkways, pet beds, electrical cords, etc? Yes  Adequate lighting in your home to reduce risk of falls? Yes   ASSISTIVE DEVICES UTILIZED TO PREVENT FALLS:  Life alert? Yes  Use of a cane, walker or w/c? No  Grab bars in the bathroom? Yes  Shower chair or bench in shower? Yes  Elevated toilet seat or a handicapped toilet? Yes   TIMED UP AND GO:  Was the test performed? No .      Cognitive Function:        07/10/2022   10:39 AM  6CIT Screen  What Year? 0 points  What month? 0 points  What time? 0 points  Count  back from 20 0 points  Months in reverse 4 points  Repeat phrase 10 points  Total Score 14 points    Immunizations Immunization History  Administered Date(s) Administered   Fluad Quad(high Dose 65+) 08/23/2019, 08/02/2020   Influenza, High Dose Seasonal PF 09/04/2017,  08/23/2018, 08/22/2021   PFIZER(Purple Top)SARS-COV-2 Vaccination 12/11/2019, 01/01/2020, 08/25/2020   Pneumococcal Conjugate-13 07/29/2014, 11/30/2018   Pneumococcal Polysaccharide-23 03/20/2012, 11/29/2018   Zoster Recombinat (Shingrix) 10/06/2017, 01/07/2018    TDAP status: Up to date  Flu Vaccine status: Due, Education has been provided regarding the importance of this vaccine. Advised may receive this vaccine at local pharmacy or Health Dept. Aware to provide a copy of the vaccination record if obtained from local pharmacy or Health Dept. Verbalized acceptance and understanding.  Pneumococcal vaccine status: Up to date  Covid-19 vaccine status: Completed vaccines  Qualifies for Shingles Vaccine? Yes   Zostavax completed Yes   Shingrix Completed?: Yes  Screening Tests Health Maintenance  Topic Date Due   COVID-19 Vaccine (4 - Pfizer series) 10/20/2020   INFLUENZA VACCINE  06/20/2022   Pneumonia Vaccine 11+ Years old  Completed   DEXA SCAN  Completed   Zoster Vaccines- Shingrix  Completed   HPV VACCINES  Aged Out    Health Maintenance  Health Maintenance Due  Topic Date Due   COVID-19 Vaccine (4 - Pfizer series) 10/20/2020   INFLUENZA VACCINE  06/20/2022    Colorectal cancer screening: No longer required.   Mammogram status: No longer required due to age.  Bone Density status: Completed 11/03/2019.   Lung Cancer Screening: (Low Dose CT Chest recommended if Age 47-80 years, 30 pack-year currently smoking OR have quit w/in 15years.) does not qualify.   Lung Cancer Screening Referral: no  Additional Screening:  Hepatitis C Screening: does not qualify;   Vision Screening: Recommended annual ophthalmology exams for early detection of glaucoma and other disorders of the eye. Is the patient up to date with their annual eye exam?  Yes  Who is the provider or what is the name of the office in which the patient attends annual eye exams? [facility] If pt is not  established with a provider, would they like to be referred to a provider to establish care? No .   Dental Screening: Recommended annual dental exams for proper oral hygiene  Community Resource Referral / Chronic Care Management: CRR required this visit?  No   CCM required this visit?  No      Plan:     I have personally reviewed and noted the following in the patient's chart:   Medical and social history Use of alcohol, tobacco or illicit drugs  Current medications and supplements including opioid prescriptions. Patient is not currently taking opioid prescriptions. Functional ability and status Nutritional status Physical activity Advanced directives List of other physicians Hospitalizations, surgeries, and ER visits in previous 12 months Vitals Screenings to include cognitive, depression, and falls Referrals and appointments  In addition, I have reviewed and discussed with patient certain preventive protocols, quality metrics, and best practice recommendations. A written personalized care plan for preventive services as well as general preventive health recommendations were provided to patient.     Kellie Simmering, LPN   [id number]/42/[id number]   Nurse Notes: none  Due to this being a virtual visit, the after visit summary with patients personalized plan was offered to patient via mail or my-chart.  per request, patient was mailed a copy of AVS.

## 2022-07-12 DIAGNOSIS — M25562 Pain in left knee: Secondary | ICD-10-CM | POA: Diagnosis not present

## 2022-07-12 DIAGNOSIS — M1712 Unilateral primary osteoarthritis, left knee: Secondary | ICD-10-CM | POA: Diagnosis not present

## 2022-07-12 DIAGNOSIS — M25462 Effusion, left knee: Secondary | ICD-10-CM | POA: Diagnosis not present

## 2022-07-13 DIAGNOSIS — L710 Perioral dermatitis: Secondary | ICD-10-CM | POA: Diagnosis not present

## 2022-07-25 ENCOUNTER — Other Ambulatory Visit: Payer: Self-pay | Admitting: Adult Health

## 2022-07-25 DIAGNOSIS — F32A Depression, unspecified: Secondary | ICD-10-CM

## 2022-07-25 DIAGNOSIS — I10 Essential (primary) hypertension: Secondary | ICD-10-CM

## 2022-07-27 ENCOUNTER — Other Ambulatory Visit: Payer: Self-pay | Admitting: Adult Health

## 2022-07-27 DIAGNOSIS — R519 Headache, unspecified: Secondary | ICD-10-CM

## 2022-07-27 NOTE — Telephone Encounter (Signed)
Pt needs a CPE for further refills 

## 2022-08-18 ENCOUNTER — Other Ambulatory Visit: Payer: Self-pay | Admitting: Adult Health

## 2022-08-18 DIAGNOSIS — F419 Anxiety disorder, unspecified: Secondary | ICD-10-CM

## 2022-08-18 DIAGNOSIS — I10 Essential (primary) hypertension: Secondary | ICD-10-CM

## 2022-08-18 DIAGNOSIS — F32A Depression, unspecified: Secondary | ICD-10-CM

## 2022-08-21 ENCOUNTER — Telehealth: Payer: Self-pay | Admitting: Adult Health

## 2022-08-21 ENCOUNTER — Encounter: Payer: Self-pay | Admitting: Family Medicine

## 2022-08-21 ENCOUNTER — Ambulatory Visit (INDEPENDENT_AMBULATORY_CARE_PROVIDER_SITE_OTHER): Payer: Medicare Other | Admitting: Family Medicine

## 2022-08-21 VITALS — BP 118/78 | HR 95 | Temp 98.3°F | Ht 63.0 in | Wt 127.0 lb

## 2022-08-21 DIAGNOSIS — R112 Nausea with vomiting, unspecified: Secondary | ICD-10-CM

## 2022-08-21 MED ORDER — ONDANSETRON HCL 4 MG PO TABS: 4.0000 mg | ORAL_TABLET | Freq: Three times a day (TID) | ORAL | 0 refills | Status: DC | PRN

## 2022-08-21 NOTE — Progress Notes (Signed)
Acute Office Visit  Subjective:     Patient ID: Sheila Wolf, female    DOB: 07/14/1934, 88 y.o.   MRN: [id number]  Chief Complaint  Patient presents with   Nausea    Patient complains of nausea and vomiting x2-3 days, states the home Covid test was negative    Patient has been having vomiting for about 2 days. Reports she has only been able to keep down a bit of ginger ale. States she tried some toast yesterday but couldn't swallow it. States she is dry heaving also. She reports rumbling in her stomach but no pain, maybe some subjective chills, no fever. She reports that her next door neighbor had COVID, has not been tested yet. Maybe some nasal congestion but no diarrhea, no real fevers that she has felt. No body aches or pains, some mild fatigue. No burning with urination or pelvic symptoms, no back pain, states she had a normal BM yesterday.     Review of Systems  Constitutional:  Negative for fever.  HENT:  Negative for congestion and sinus pain.   Respiratory:  Negative for cough, sputum production and shortness of breath.   Cardiovascular:  Negative for chest pain and leg swelling.  Gastrointestinal:  Positive for heartburn. Negative for abdominal pain, blood in stool, constipation and diarrhea.  Genitourinary:  Negative for dysuria.  Musculoskeletal:  Negative for myalgias.  All other systems reviewed and are negative.       Objective:    BP 118/78 (BP Location: Left Arm, Patient Position: Sitting, Cuff Size: Normal)   Pulse 95   Temp 98.3 F (36.8 C) (Oral)   Ht '5\' 3"'$  (1.6 m)   Wt 127 lb (57.6 kg)   SpO2 98%   BMI 22.50 kg/m    Physical Exam Vitals reviewed.  Constitutional:      Appearance: Normal appearance. She is well-groomed and normal weight.  HENT:     Head: Normocephalic and atraumatic.     Right Ear: Tympanic membrane and ear canal normal.     Left Ear: Tympanic membrane and ear canal normal.     Mouth/Throat:     Mouth: Mucous  membranes are moist.     Pharynx: Oropharynx is clear. No oropharyngeal exudate or posterior oropharyngeal erythema.  Eyes:     Extraocular Movements: Extraocular movements intact.     Conjunctiva/sclera: Conjunctivae normal.     Pupils: Pupils are equal, round, and reactive to light.  Cardiovascular:     Rate and Rhythm: Normal rate and regular rhythm.     Heart sounds: S1 normal and S2 normal.  Pulmonary:     Effort: Pulmonary effort is normal.     Breath sounds: Normal breath sounds and air entry.  Abdominal:     General: Abdomen is flat. Bowel sounds are normal. There is no distension.     Palpations: Abdomen is soft.     Tenderness: There is no abdominal tenderness. There is no guarding.  Skin:    General: Skin is warm and dry.  Neurological:     Mental Status: She is alert and oriented to person, place, and time. Mental status is at baseline.     Gait: Gait is intact.  Psychiatric:        Mood and Affect: Mood and affect normal.        Speech: Speech normal.        Behavior: Behavior normal.        Judgment: Judgment normal.  No results found for any visits on 08/21/22.      Assessment & Plan:   Problem List Items Addressed This Visit   None Visit Diagnoses     Nausea and vomiting, unspecified vomiting type    -  Primary   Relevant Medications   ondansetron (ZOFRAN) 4 MG tablet  Most likely secondary to Acute gastroenteritis. There are no red flag symptoms on physical exam at this time, no abdominal pain. I recommended that we treat the nausea/vomiting with ondansetron 4 mg every 8 hours as needed and I advised patient to increase hydration when the nausea improves. If she continues to have symptoms or if new symptoms develop then she will need additional work up.     Meds ordered this encounter  Medications   ondansetron (ZOFRAN) 4 MG tablet    Sig: Take 1 tablet (4 mg total) by mouth every 8 (eight) hours as needed for nausea or vomiting.    Dispense:  20  tablet    Refill:  0    No follow-ups on file.  Farrel Conners, MD

## 2022-08-21 NOTE — Telephone Encounter (Signed)
Requesting refill meloxicam (MOBIC) 15 MG tablet,  escitalopram (LEXAPRO) 20 MG tablet, metoprolol succinate (TOPROL-XL) 50 MG 24 hr tablet

## 2022-08-21 NOTE — Patient Instructions (Signed)
Increase fluids - may use soups, gatorade, ice cream, popsicles, etc.   Zofran every 8 hours as needed for nausea.

## 2022-08-21 NOTE — Telephone Encounter (Signed)
Last OV 02/07/22 for acute reasons.  Pt has not had CPE since 01/18/21.  Pt aware that medications cannot be refilled due to no CPE. Pt verb understanding. Appt scheduled for 08/29/22

## 2022-08-22 ENCOUNTER — Telehealth: Payer: Self-pay | Admitting: Adult Health

## 2022-08-22 DIAGNOSIS — I10 Essential (primary) hypertension: Secondary | ICD-10-CM

## 2022-08-22 DIAGNOSIS — F419 Anxiety disorder, unspecified: Secondary | ICD-10-CM

## 2022-08-22 MED ORDER — ESCITALOPRAM OXALATE 20 MG PO TABS: 20.0000 mg | ORAL_TABLET | Freq: Every day | ORAL | 0 refills | Status: DC

## 2022-08-22 MED ORDER — MELOXICAM 15 MG PO TABS: 15.0000 mg | ORAL_TABLET | Freq: Every day | ORAL | 0 refills | Status: DC

## 2022-08-22 MED ORDER — METOPROLOL SUCCINATE ER 50 MG PO TB24: 50.0000 mg | ORAL_TABLET | Freq: Every day | ORAL | 0 refills | Status: DC

## 2022-08-22 NOTE — Telephone Encounter (Signed)
Pt needs refills on Metoprolol, Meloxicam, and [place]

## 2022-08-24 ENCOUNTER — Other Ambulatory Visit: Payer: Self-pay | Admitting: Adult Health

## 2022-08-24 DIAGNOSIS — F419 Anxiety disorder, unspecified: Secondary | ICD-10-CM

## 2022-08-25 ENCOUNTER — Telehealth: Payer: Self-pay | Admitting: Adult Health

## 2022-08-25 DIAGNOSIS — Z23 Encounter for immunization: Secondary | ICD-10-CM | POA: Diagnosis not present

## 2022-08-25 NOTE — Telephone Encounter (Signed)
Pt called to inform MD that she received her flu shot today at [facility].

## 2022-08-28 NOTE — Telephone Encounter (Signed)
Noted. Added to record.

## 2022-08-29 ENCOUNTER — Encounter: Payer: Medicare Other | Admitting: Adult Health

## 2022-09-08 ENCOUNTER — Other Ambulatory Visit: Payer: Self-pay | Admitting: Adult Health

## 2022-09-08 DIAGNOSIS — R519 Headache, unspecified: Secondary | ICD-10-CM

## 2022-09-08 DIAGNOSIS — L292 Pruritus vulvae: Secondary | ICD-10-CM | POA: Diagnosis not present

## 2022-09-08 NOTE — Telephone Encounter (Signed)
Okay for refill?  

## 2022-09-18 ENCOUNTER — Telehealth: Payer: Self-pay | Admitting: Adult Health

## 2022-09-18 DIAGNOSIS — Z760 Encounter for issue of repeat prescription: Secondary | ICD-10-CM

## 2022-09-18 DIAGNOSIS — I10 Essential (primary) hypertension: Secondary | ICD-10-CM

## 2022-09-19 NOTE — Telephone Encounter (Signed)
Patient need to schedule an ov for more refills. 

## 2022-09-20 ENCOUNTER — Other Ambulatory Visit: Payer: Self-pay | Admitting: Adult Health

## 2022-09-20 DIAGNOSIS — I10 Essential (primary) hypertension: Secondary | ICD-10-CM

## 2022-09-20 DIAGNOSIS — Z760 Encounter for issue of repeat prescription: Secondary | ICD-10-CM

## 2022-09-20 MED ORDER — AMLODIPINE BESYLATE 5 MG PO TABS: 5.0000 mg | ORAL_TABLET | Freq: Every day | ORAL | 0 refills | Status: DC

## 2022-09-20 NOTE — Telephone Encounter (Signed)
Patient scheduled OV for 09/27/22 at 11:30. Requesting a partial fill to get her thru

## 2022-09-20 NOTE — Telephone Encounter (Signed)
Pt has been rescheduled. Rx filled until CPE

## 2022-09-20 NOTE — Addendum Note (Signed)
Addended by: Gwenyth Ober R on: 09/20/2022 04:49 PM   Modules accepted: Orders

## 2022-09-26 DIAGNOSIS — Z23 Encounter for immunization: Secondary | ICD-10-CM | POA: Diagnosis not present

## 2022-09-27 ENCOUNTER — Ambulatory Visit: Payer: Medicare Other | Admitting: Adult Health

## 2022-09-28 ENCOUNTER — Other Ambulatory Visit: Payer: Self-pay | Admitting: Adult Health

## 2022-09-28 DIAGNOSIS — I10 Essential (primary) hypertension: Secondary | ICD-10-CM

## 2022-09-28 DIAGNOSIS — F32A Depression, unspecified: Secondary | ICD-10-CM

## 2022-10-05 DIAGNOSIS — Z961 Presence of intraocular lens: Secondary | ICD-10-CM | POA: Diagnosis not present

## 2022-10-06 DIAGNOSIS — M170 Bilateral primary osteoarthritis of knee: Secondary | ICD-10-CM | POA: Diagnosis not present

## 2022-10-06 DIAGNOSIS — M25562 Pain in left knee: Secondary | ICD-10-CM | POA: Diagnosis not present

## 2022-10-24 ENCOUNTER — Other Ambulatory Visit: Payer: Self-pay | Admitting: Adult Health

## 2022-10-24 DIAGNOSIS — I10 Essential (primary) hypertension: Secondary | ICD-10-CM

## 2022-10-24 DIAGNOSIS — F32A Depression, unspecified: Secondary | ICD-10-CM

## 2022-11-09 ENCOUNTER — Encounter: Payer: Self-pay | Admitting: Adult Health

## 2022-11-09 ENCOUNTER — Ambulatory Visit (INDEPENDENT_AMBULATORY_CARE_PROVIDER_SITE_OTHER): Payer: Medicare Other | Admitting: Adult Health

## 2022-11-09 VITALS — BP 120/63 | HR 88 | Temp 98.1°F | Ht 63.0 in | Wt 126.0 lb

## 2022-11-09 DIAGNOSIS — J069 Acute upper respiratory infection, unspecified: Secondary | ICD-10-CM

## 2022-11-09 DIAGNOSIS — M159 Polyosteoarthritis, unspecified: Secondary | ICD-10-CM | POA: Diagnosis not present

## 2022-11-09 DIAGNOSIS — I10 Essential (primary) hypertension: Secondary | ICD-10-CM | POA: Diagnosis not present

## 2022-11-09 DIAGNOSIS — R519 Headache, unspecified: Secondary | ICD-10-CM

## 2022-11-09 DIAGNOSIS — K219 Gastro-esophageal reflux disease without esophagitis: Secondary | ICD-10-CM | POA: Diagnosis not present

## 2022-11-09 DIAGNOSIS — E782 Mixed hyperlipidemia: Secondary | ICD-10-CM

## 2022-11-09 DIAGNOSIS — R413 Other amnesia: Secondary | ICD-10-CM

## 2022-11-09 DIAGNOSIS — F32A Depression, unspecified: Secondary | ICD-10-CM

## 2022-11-09 DIAGNOSIS — F419 Anxiety disorder, unspecified: Secondary | ICD-10-CM

## 2022-11-09 MED ORDER — METOPROLOL SUCCINATE ER 50 MG PO TB24: ORAL_TABLET | ORAL | 3 refills | Status: DC

## 2022-11-09 MED ORDER — MELOXICAM 15 MG PO TABS: 15.0000 mg | ORAL_TABLET | Freq: Every day | ORAL | 3 refills | Status: DC

## 2022-11-09 MED ORDER — BENZONATATE 200 MG PO CAPS: 200.0000 mg | ORAL_CAPSULE | Freq: Two times a day (BID) | ORAL | 0 refills | Status: DC | PRN

## 2022-11-09 NOTE — Patient Instructions (Signed)
It was great seeing you today   Please schedule your lab appointment at the front desk   Your memory score was perfect today   Please let me know if you need anything

## 2022-11-09 NOTE — Progress Notes (Signed)
Subjective:    Patient ID: Sheila Wolf, female    DOB: 1934/01/18, 88 y.o.   MRN: [id number]  HPI Patient presents for yearly preventative medicine examination. She is a pleasant 88 year old female who  has a past medical history of Anxiety and depression, Arthritis, Frequent headaches, GERD (gastroesophageal reflux disease), Hypertension, Osteoporosis, Thrombocytopathia ([place]), and Urinary incontinence.  Hypertension - managed with Norvasc 5 mg  and metoprolol 50 mg daily.  She denies dizziness, lightheadedness, chest pain, or shortness of breath BP Readings from Last 3 Encounters:  11/09/22 120/63  08/21/22 118/78  06/26/22 (!) 112/56   Anxiety and depression-managed with Lexapro 20 mg daily.  She does feel well-controlled on this medication  Osteoarthritis - Takes Mobic 15 mg daily.   Headaches-uses Fioricet as needed.  She has had headaches all of her life.  There have been no changes in the quantity or quality of her headaches.  Hyperlipidemia - managed with simvastatin 10 mg daily. She denies myalgia or fatigue  Lab Results  Component Value Date   CHOL 240 (H) 01/18/2021   HDL 117.40 01/18/2021   LDLCALC 108 (H) 01/18/2021   TRIG 71.0 01/18/2021   CHOLHDL 2 01/18/2021   Cognitive Impairment - mild, age related. Does not feel like it has gotten much worse over the last year   Cough -she has had a cough for the last few days.  She does feel as though she is improving.  Associated symptoms include rhinorrhea with clear drainage.  She has not had any fevers or chills.  At home has been using Coricidin HBP and Tessalon Perles and finds that these medications are helpful.  She does need a refill of her Ladona Ridgel  All immunizations and health maintenance protocols were reviewed with the patient and needed orders were placed.  Appropriate screening laboratory values were ordered for the patient including screening of hyperlipidemia, renal function and hepatic  function.  Medication reconciliation,  past medical history, social history, problem list and allergies were reviewed in detail with the patient  Goals were established with regard to weight loss, exercise, and  diet in compliance with medications Wt Readings from Last 3 Encounters:  11/09/22 126 lb (57.2 kg)  08/21/22 127 lb (57.6 kg)  07/10/22 130 lb (59 kg)    Review of Systems  Constitutional: Negative.   HENT:  Positive for rhinorrhea.   Eyes: Negative.   Respiratory:  Positive for cough.   Cardiovascular: Negative.   Gastrointestinal: Negative.   Endocrine: Negative.   Genitourinary: Negative.   Musculoskeletal: Negative.   Skin: Negative.   Allergic/Immunologic: Negative.   Neurological: Negative.   Hematological: Negative.   Psychiatric/Behavioral: Negative.     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[place])    Urinary incontinence     Social History   Socioeconomic History   Marital status: Widowed    Spouse name: Not on file   Number of children: Not on file   Years of education: Not on file   Highest education level: Not on file  Occupational History   Not on file  Tobacco Use   Smoking status: Former    Years: 10.00    Types: Cigarettes   Smokeless tobacco: Never  Vaping Use   Vaping Use: Never used  Substance and  Sexual Activity   Alcohol use: Yes    Alcohol/week: 10.0 standard drinks of alcohol    Types: 10 Glasses of wine per week   Drug use: No   Sexual activity: Not Currently  Other Topics Concern   Not on file  Social History Narrative   Not on file   Social Determinants of Health   Financial Resource Strain: Low Risk  (07/10/2022)   Overall Financial Resource Strain (CARDIA)    Difficulty of Paying Living Expenses: Not hard at all  Food Insecurity: No Food Insecurity (07/10/2022)    Hunger Vital Sign    Worried About Running Out of Food in the Last Year: Never true    Ran Out of Food in the Last Year: Never true  Transportation Needs: No Transportation Needs (07/10/2022)   PRAPARE - Hydrologist (Medical): No    Lack of Transportation (Non-Medical): No  Physical Activity: Inactive (07/10/2022)   Exercise Vital Sign    Days of Exercise per Week: 0 days    Minutes of Exercise per Session: 0 min  Stress: No Stress Concern Present (07/10/2022)   [place]    Feeling of Stress : Not at all  Social Connections: Moderately Isolated (06/21/2021)   Social Connection and Isolation Panel [NHANES]    Frequency of Communication with Friends and Family: Three times a week    Frequency of Social Gatherings with Friends and Family: Three times a week    Attends Religious Services: 1 to 4 times per year    Active Member of Clubs or Organizations: No    Attends Archivist Meetings: Never    Marital Status: Widowed  Intimate Partner Violence: Not At Risk (06/21/2021)   Humiliation, Afraid, Rape, and Kick questionnaire    Fear of Current or Ex-Partner: No    Emotionally Abused: No    Physically Abused: No    Sexually Abused: No    Past Surgical History:  Procedure Laterality Date   APPENDECTOMY     BUNIONECTOMY     CERVICAL SPINE SURGERY     SHOULDER SURGERY     TONSILLECTOMY AND ADENOIDECTOMY      Family History  Problem Relation Age of Onset   Arthritis Mother    Hearing loss Mother    Heart disease Mother    Hypertension Mother    Alcohol abuse Father    Early death Father    Heart disease Father    Stroke Father    Birth defects Brother    Heart disease Brother     Allergies  Allergen Reactions   Penicillins Rash    Current Outpatient Medications on File Prior to Visit  Medication Sig Dispense Refill   amLODipine (NORVASC) 5 MG tablet Take 1 tablet (5  mg total) by mouth daily. 90 tablet 0   butalbital-acetaminophen-caffeine (FIORICET) 50-325-40 MG tablet TAKE 1 TABLET BY MOUTH 4 TIMES DAILY AS NEEDED FOR HEADACHE 60 tablet 1   cetirizine (ZYRTEC) 10 MG tablet TAKE 1 TABLET BY MOUTH EVERY DAY 90 tablet 3   cycloSPORINE (RESTASIS) 0.05 % ophthalmic emulsion 1 drop 2 (two) times daily.     escitalopram (LEXAPRO) 20 MG tablet TAKE 1 TABLET BY MOUTH EVERY DAY 30 tablet 0   fluconazole (DIFLUCAN) 150 MG tablet Take one tablet every three days 3 tablet 0   fluticasone (FLONASE) 50 MCG/ACT nasal spray Place 2 sprays into both nostrils daily. 16 g 6  Melatonin 5 MG CAPS Take by mouth.     meloxicam (MOBIC) 15 MG tablet TAKE 1 TABLET BY MOUTH EVERY DAY 30 tablet 0   metoprolol succinate (TOPROL-XL) 50 MG 24 hr tablet TAKE 1 TABLET BY MOUTH EVERY DAY. KEEP APPT FOR MORE REFILLS 30 tablet 0   nystatin-triamcinolone ointment (MYCOLOG) apply topically 2 TIMES DAILY 30 g 1   omeprazole (PRILOSEC) 20 MG capsule TAKE 1 CAPSULE BY MOUTH EVERY DAY 90 capsule 0   ondansetron (ZOFRAN) 4 MG tablet Take 1 tablet (4 mg total) by mouth every 8 (eight) hours as needed for nausea or vomiting. 20 tablet 0   simvastatin (ZOCOR) 10 MG tablet TAKE 1 TABLET BY MOUTH AT BEDTIME 90 tablet 3   traMADol (ULTRAM) 50 MG tablet      No current facility-administered medications on file prior to visit.    BP 120/63   Pulse 88   Temp 98.1 F (36.7 C) (Oral)   Ht '5\' 3"'$  (1.6 m)   Wt 126 lb (57.2 kg)   SpO2 97%   BMI 22.32 kg/m       Objective:   Physical Exam Vitals and nursing note reviewed.  Constitutional:      General: She is not in acute distress.    Appearance: Normal appearance. She is well-developed. She is not ill-appearing.  HENT:     Head: Normocephalic and atraumatic.     Right Ear: Tympanic membrane, ear canal and external ear normal. There is no impacted cerumen.     Left Ear: Tympanic membrane, ear canal and external ear normal. There is no impacted  cerumen.     Nose: Rhinorrhea present. No congestion. Rhinorrhea is clear.     Mouth/Throat:     Mouth: Mucous membranes are moist.     Pharynx: Oropharynx is clear. No oropharyngeal exudate or posterior oropharyngeal erythema.  Eyes:     General:        Right eye: No discharge.        Left eye: No discharge.     Extraocular Movements: Extraocular movements intact.     Conjunctiva/sclera: Conjunctivae normal.     Pupils: Pupils are equal, round, and reactive to light.  Neck:     Thyroid: No thyromegaly.     Vascular: No carotid bruit.     Trachea: No tracheal deviation.  Cardiovascular:     Rate and Rhythm: Normal rate and regular rhythm.     Pulses: Normal pulses.     Heart sounds: Normal heart sounds. No murmur heard.    No friction rub. No gallop.  Pulmonary:     Effort: Pulmonary effort is normal. No respiratory distress.     Breath sounds: Normal breath sounds. No stridor. No wheezing, rhonchi or rales.  Chest:     Chest wall: No tenderness.  Abdominal:     General: Abdomen is flat. Bowel sounds are normal. There is no distension.     Palpations: Abdomen is soft. There is no mass.     Tenderness: There is no abdominal tenderness. There is no right CVA tenderness, left CVA tenderness, guarding or rebound.     Hernia: No hernia is present.  Musculoskeletal:        General: No swelling, tenderness, deformity or signs of injury. Normal range of motion.     Cervical back: Normal range of motion and neck supple.     Right lower leg: No edema.     Left lower leg: No edema.  Lymphadenopathy:  Cervical: No cervical adenopathy.  Skin:    General: Skin is warm and dry.     Coloration: Skin is not jaundiced or pale.     Findings: No bruising, erythema, lesion or rash.  Neurological:     General: No focal deficit present.     Mental Status: She is alert and oriented to person, place, and time.     Cranial Nerves: No cranial nerve deficit.     Sensory: No sensory deficit.      Motor: No weakness.     Coordination: Coordination normal.     Gait: Gait normal.     Deep Tendon Reflexes: Reflexes normal.  Psychiatric:        Mood and Affect: Mood normal.        Behavior: Behavior normal.        Thought Content: Thought content normal.        Judgment: Judgment normal.       Assessment & Plan:  1. Essential hypertension - Well controlled. No change in medication  - CBC with Differential/Platelet; Future - Comprehensive metabolic panel; Future - Lipid panel; Future - TSH; Future - metoprolol succinate (TOPROL-XL) 50 MG 24 hr tablet; TAKE 1 TABLET BY MOUTH EVERY DAY.  Dispense: 90 tablet; Refill: 3  2. Anxiety and depression - Well controlled on SSRI   3. Memory loss MMSE 30/30 today  - CBC with Differential/Platelet; Future - Comprehensive metabolic panel; Future - Lipid panel; Future - TSH; Future  4. Mixed hyperlipidemia - Consider increase in statin therapy  - CBC with Differential/Platelet; Future - Comprehensive metabolic panel; Future - Lipid panel; Future - TSH; Future  5. Frequent headaches - Continue with Fioricet PRN  - CBC with Differential/Platelet; Future - Comprehensive metabolic panel; Future - Lipid panel; Future - TSH; Future  6. Primary osteoarthritis involving multiple joints  - meloxicam (MOBIC) 15 MG tablet; Take 1 tablet (15 mg total) by mouth daily.  Dispense: 90 tablet; Refill: 3  7. Viral URI with cough  - benzonatate (TESSALON) 200 MG capsule; Take 1 capsule (200 mg total) by mouth 2 (two) times daily as needed for cough.  Dispense: 20 capsule; Refill: 0  Dorothyann Peng, NP

## 2022-11-10 ENCOUNTER — Other Ambulatory Visit (INDEPENDENT_AMBULATORY_CARE_PROVIDER_SITE_OTHER): Payer: Medicare Other

## 2022-11-10 DIAGNOSIS — R519 Headache, unspecified: Secondary | ICD-10-CM

## 2022-11-10 DIAGNOSIS — R413 Other amnesia: Secondary | ICD-10-CM | POA: Diagnosis not present

## 2022-11-10 DIAGNOSIS — E782 Mixed hyperlipidemia: Secondary | ICD-10-CM

## 2022-11-10 DIAGNOSIS — I10 Essential (primary) hypertension: Secondary | ICD-10-CM | POA: Diagnosis not present

## 2022-11-10 LAB — COMPREHENSIVE METABOLIC PANEL
ALT: 13 U/L (ref 0–35)
AST: 21 U/L (ref 0–37)
Albumin: 4 g/dL (ref 3.5–5.2)
Alkaline Phosphatase: 46 U/L (ref 39–117)
BUN: 24 mg/dL — ABNORMAL HIGH (ref 6–23)
CO2: 29 mEq/L (ref 19–32)
Calcium: 9.4 mg/dL (ref 8.4–10.5)
Chloride: 103 mEq/L (ref 96–112)
Creatinine, Ser: 1.06 mg/dL (ref 0.40–1.20)
GFR: 47.59 mL/min — ABNORMAL LOW (ref >60.00)
Glucose, Bld: 104 mg/dL — ABNORMAL HIGH (ref 70–99)
Potassium: 4.3 mEq/L (ref 3.5–5.1)
Sodium: 139 mEq/L (ref 135–145)
Total Bilirubin: 0.6 mg/dL (ref 0.2–1.2)
Total Protein: 6.8 g/dL (ref 6.0–8.3)

## 2022-11-10 LAB — CBC WITH DIFFERENTIAL/PLATELET
Basophils Absolute: 0 10*3/uL (ref 0.0–0.1)
Basophils Relative: 0.9 % (ref 0.0–3.0)
Eosinophils Absolute: 0.2 10*3/uL (ref 0.0–0.7)
Eosinophils Relative: 4.1 % (ref 0.0–5.0)
HCT: 39.3 % (ref 36.0–46.0)
Hemoglobin: 12.9 g/dL (ref 12.0–15.0)
Lymphocytes Relative: 25.2 % (ref 12.0–46.0)
Lymphs Abs: 1.3 10*3/uL (ref 0.7–4.0)
MCHC: 32.8 g/dL (ref 30.0–36.0)
MCV: 96.3 fl (ref 78.0–100.0)
Monocytes Absolute: 0.9 10*3/uL (ref 0.1–1.0)
Monocytes Relative: 18 % — ABNORMAL HIGH (ref 3.0–12.0)
Neutro Abs: 2.6 10*3/uL (ref 1.4–7.7)
Neutrophils Relative %: 51.8 % (ref 43.0–77.0)
Platelets: 156 10*3/uL (ref 150.0–400.0)
RBC: 4.08 Mil/uL (ref 3.87–5.11)
RDW: 14.3 % (ref 11.5–15.5)
WBC: 5 10*3/uL (ref 4.0–10.5)

## 2022-11-10 LAB — LIPID PANEL
Cholesterol: 217 mg/dL — ABNORMAL HIGH (ref 0–200)
HDL: 81.1 mg/dL (ref >39.00)
LDL Cholesterol: 117 mg/dL — ABNORMAL HIGH (ref 0–99)
NonHDL: 135.68
Total CHOL/HDL Ratio: 3
Triglycerides: 94 mg/dL (ref 0.0–149.0)
VLDL: 18.8 mg/dL (ref 0.0–40.0)

## 2022-11-11 ENCOUNTER — Other Ambulatory Visit: Payer: Self-pay | Admitting: Adult Health

## 2022-11-11 DIAGNOSIS — R519 Headache, unspecified: Secondary | ICD-10-CM

## 2022-11-11 LAB — TSH: TSH: 1.15 u[IU]/mL (ref 0.35–5.50)

## 2022-11-16 ENCOUNTER — Telehealth: Payer: Self-pay

## 2022-11-16 NOTE — Telephone Encounter (Signed)
Pt called wanting me to speak with daughter regarding labs. Lab work will be printed and mailed off to pt. Pt provided daughter information: Eustaquio Maize [phone]  I called Beth went over lab results as well as the mini mental as she was concerned about pt memory. Beth also is concerned about pt not taking the right medications when she needs to take them. I advised that the pharmacist may be able to help with this. I also advised that the pharmacist will be leaving so she may or may not be able to help. Beth verbalized understanding.

## 2022-11-24 ENCOUNTER — Other Ambulatory Visit: Payer: Self-pay | Admitting: Adult Health

## 2022-11-24 DIAGNOSIS — I10 Essential (primary) hypertension: Secondary | ICD-10-CM

## 2022-11-24 DIAGNOSIS — E782 Mixed hyperlipidemia: Secondary | ICD-10-CM

## 2022-11-24 DIAGNOSIS — F32A Depression, unspecified: Secondary | ICD-10-CM

## 2022-12-04 ENCOUNTER — Other Ambulatory Visit: Payer: Medicare Other

## 2022-12-04 NOTE — Progress Notes (Addendum)
12/04/2022 Name: Sheila Wolf MRN: [id number] DOB: 08/16/1934  Chief Complaint  Patient presents with   Medication Problem    Sheila Wolf is a 89 y.o. year old female who presented for a telephone visit.   They were referred to the pharmacist by their PCP for assistance in managing  medication adherence .   Patient is participating in a Managed Medicaid Plan:  No  Subjective:  Care Team: Primary Care Provider: Dorothyann Peng, NP  Medication Access/Adherence  Current Pharmacy:  [facility], [place] [address] [place] [address] Phone: [phone] Fax: [phone]   Patient reports affordability concerns with their medications: No  Patient reports access/transportation concerns to their pharmacy: No  Patient reports adherence concerns with their medications:  Yes  Forgetting/not taking doses   Medication Management:  Current adherence strategy: Uses daily pill organizer  Patient reports Fair adherence to medications  Patient reports the following barriers to adherence: Unsure but potentially forgetting to take medications/doses  Objective:  Lab Results  Component Value Date   CREATININE 1.06 11/10/2022   BUN 24 (H) 11/10/2022   NA 139 11/10/2022   K 4.3 11/10/2022   CL 103 11/10/2022   CO2 29 11/10/2022    Lab Results  Component Value Date   CHOL 217 (H) 11/10/2022   HDL 81.10 11/10/2022   LDLCALC 117 (H) 11/10/2022   TRIG 94.0 11/10/2022   CHOLHDL 3 11/10/2022    Medications Reviewed Today     Reviewed by Darlina Guys, RPH (Pharmacist) on 12/04/22 at 1134  Med List Status: <None>   Medication Order Taking? Sig Documenting Provider Last Dose Status Informant  amLODipine (NORVASC) 5 MG tablet [id number] Yes Take 1 tablet (5 mg total) by mouth daily. Laurey Morale, MD  Active     Discontinued 12/04/22 1114 (Completed Course)   butalbital-acetaminophen-caffeine (FIORICET)  50-325-40 MG tablet [id number] Yes TAKE 1 TABLET BY MOUTH 4 TIMES DAILY AS NEEDED FOR HEADACHE Dorothyann Peng, NP  Active            Med Note Colin Rhein, Kalyani Maeda A   Mon Dec 04, 2022 11:32 AM) As needed  cetirizine (ZYRTEC) 10 MG tablet [id number]  TAKE 1 TABLET BY MOUTH EVERY DAY Nafziger, [name], NP  Active            Med Note Colin Rhein, Atianna Haidar A   Mon Dec 04, 2022 11:15 AM) Does not take daily  cycloSPORINE (RESTASIS) 0.05 % ophthalmic emulsion [id number]  1 drop 2 (two) times daily. [provider]  Active            Med Note Mayra Neer Dec 04, 2022 11:32 AM) Daughter unsure  escitalopram (LEXAPRO) 20 MG tablet [id number] Yes TAKE 1 TABLET BY MOUTH EVERY DAY Dorothyann Peng, NP  Active     Discontinued 12/04/22 1133 (Completed Course)   Melatonin 5 MG CAPS [id number]  Take by mouth. [provider]  Active   meloxicam (MOBIC) 15 MG tablet [id number] Yes Take 1 tablet (15 mg total) by mouth daily. Nafziger, [name], NP  Active   metoprolol succinate (TOPROL-XL) 50 MG 24 hr tablet [id number] Yes TAKE 1 TABLET BY MOUTH EVERY DAY. Dorothyann Peng, NP  Active   nystatin-triamcinolone ointment [facility]) [id number] Yes apply topically 2 TIMES DAILY Nafziger, [name], NP  Active   simvastatin (ZOCOR) 10 MG tablet [id number] Yes TAKE 1 TABLET BY MOUTH AT BEDTIME Dorothyann Peng, NP  Active  Med Note Colin Rhein, Elayna Tobler A   Mon Dec 04, 2022 11:34 AM) Had not been taking, but has resumed as of discussion with daughter 12/04/22     Discontinued 12/04/22 1134 (Completed Course)             Assessment/Plan:   Medication Management: - Currently strategy insufficient to maintain appropriate adherence to prescribed medication regimen - Daughter states patient uses weekly medication organizer, but she has never witnessed her fill to verify accuracy - Verified current medication list with daughter - Discussed collaboration with local pharmacies for adherence packaging. Reviewed local  pharmacies with adherence packaging options. Daughter is going to discuss with [name] & will reach back out to me. -Offered to have [facility] transfer prescriptions; so they could do adherence packing and deliver to pharmacy.  Follow Up Plan: Will contact daughter next Monday to check in if I have not heard back. Spoke with daughter, [name], again on 12/11/22; and [name] does not wish to transfer medications to a different pharmacy offering adherence packaging at this time.  She has my contact information and will reach out if/when this changes.

## 2022-12-12 ENCOUNTER — Telehealth: Payer: Self-pay | Admitting: Adult Health

## 2022-12-12 DIAGNOSIS — J069 Acute upper respiratory infection, unspecified: Secondary | ICD-10-CM

## 2022-12-12 DIAGNOSIS — R519 Headache, unspecified: Secondary | ICD-10-CM

## 2022-12-14 ENCOUNTER — Other Ambulatory Visit: Payer: Self-pay | Admitting: Adult Health

## 2022-12-14 DIAGNOSIS — H524 Presbyopia: Secondary | ICD-10-CM | POA: Diagnosis not present

## 2022-12-14 DIAGNOSIS — H04123 Dry eye syndrome of bilateral lacrimal glands: Secondary | ICD-10-CM | POA: Diagnosis not present

## 2022-12-14 DIAGNOSIS — H52203 Unspecified astigmatism, bilateral: Secondary | ICD-10-CM | POA: Diagnosis not present

## 2022-12-14 DIAGNOSIS — F32A Depression, unspecified: Secondary | ICD-10-CM

## 2022-12-14 DIAGNOSIS — Z961 Presence of intraocular lens: Secondary | ICD-10-CM | POA: Diagnosis not present

## 2022-12-15 NOTE — Telephone Encounter (Signed)
Pt called to FU on these refill requests.  Please advise.  [facility] - [place], [place] - [address] Phone: [phone]  Fax: [phone]

## 2022-12-18 ENCOUNTER — Other Ambulatory Visit: Payer: Self-pay

## 2022-12-18 MED ORDER — ATORVASTATIN CALCIUM 20 MG PO TABS: 20.0000 mg | ORAL_TABLET | Freq: Every day | ORAL | 3 refills | Status: DC

## 2022-12-18 NOTE — Telephone Encounter (Signed)
Pt returned call and advised that she wanted the benzonatate refilled and Fioricet. I informed pt that the Fioricet was refilled and the bezonate cannot be refilled unless she has a cough. Pt stated she didn't need it she just thought she ran out of it. No further action needed.

## 2022-12-18 NOTE — Telephone Encounter (Signed)
No medication is attached to this note. Tried to call pt to verify but no answer. Im assuming this is for the new Rx Lipitor '20mg'$ . I just sent it to the pharmacy.

## 2023-01-03 DIAGNOSIS — M170 Bilateral primary osteoarthritis of knee: Secondary | ICD-10-CM | POA: Diagnosis not present

## 2023-01-03 DIAGNOSIS — M25562 Pain in left knee: Secondary | ICD-10-CM | POA: Diagnosis not present

## 2023-01-17 ENCOUNTER — Other Ambulatory Visit: Payer: Self-pay | Admitting: Adult Health

## 2023-01-17 DIAGNOSIS — F32A Depression, unspecified: Secondary | ICD-10-CM

## 2023-02-20 ENCOUNTER — Other Ambulatory Visit: Payer: Self-pay | Admitting: Adult Health

## 2023-02-20 DIAGNOSIS — F419 Anxiety disorder, unspecified: Secondary | ICD-10-CM

## 2023-03-15 ENCOUNTER — Other Ambulatory Visit: Payer: Self-pay | Admitting: Adult Health

## 2023-03-15 DIAGNOSIS — F419 Anxiety disorder, unspecified: Secondary | ICD-10-CM

## 2023-03-23 ENCOUNTER — Other Ambulatory Visit: Payer: Self-pay | Admitting: Adult Health

## 2023-03-23 DIAGNOSIS — R519 Headache, unspecified: Secondary | ICD-10-CM

## 2023-03-23 NOTE — Telephone Encounter (Signed)
Okay for refill?    LOV 10/2022   Pt advised to return in a year

## 2023-04-02 DIAGNOSIS — M9905 Segmental and somatic dysfunction of pelvic region: Secondary | ICD-10-CM | POA: Diagnosis not present

## 2023-04-02 DIAGNOSIS — I8392 Asymptomatic varicose veins of left lower extremity: Secondary | ICD-10-CM | POA: Diagnosis not present

## 2023-04-02 DIAGNOSIS — M9904 Segmental and somatic dysfunction of sacral region: Secondary | ICD-10-CM | POA: Diagnosis not present

## 2023-04-02 DIAGNOSIS — M9906 Segmental and somatic dysfunction of lower extremity: Secondary | ICD-10-CM | POA: Diagnosis not present

## 2023-04-02 DIAGNOSIS — M25552 Pain in left hip: Secondary | ICD-10-CM | POA: Diagnosis not present

## 2023-04-02 DIAGNOSIS — M9903 Segmental and somatic dysfunction of lumbar region: Secondary | ICD-10-CM | POA: Diagnosis not present

## 2023-04-12 DIAGNOSIS — M25552 Pain in left hip: Secondary | ICD-10-CM | POA: Diagnosis not present

## 2023-04-12 DIAGNOSIS — M25562 Pain in left knee: Secondary | ICD-10-CM | POA: Diagnosis not present

## 2023-04-12 DIAGNOSIS — M7062 Trochanteric bursitis, left hip: Secondary | ICD-10-CM | POA: Diagnosis not present

## 2023-04-23 ENCOUNTER — Other Ambulatory Visit: Payer: Self-pay | Admitting: Adult Health

## 2023-04-23 DIAGNOSIS — F32A Depression, unspecified: Secondary | ICD-10-CM

## 2023-05-01 DIAGNOSIS — M25562 Pain in left knee: Secondary | ICD-10-CM | POA: Diagnosis not present

## 2023-05-02 ENCOUNTER — Other Ambulatory Visit: Payer: Self-pay | Admitting: Adult Health

## 2023-05-02 DIAGNOSIS — R519 Headache, unspecified: Secondary | ICD-10-CM

## 2023-05-02 NOTE — Telephone Encounter (Signed)
Okay for refill?  

## 2023-05-04 ENCOUNTER — Encounter (HOSPITAL_BASED_OUTPATIENT_CLINIC_OR_DEPARTMENT_OTHER): Payer: Self-pay

## 2023-05-04 ENCOUNTER — Emergency Department (HOSPITAL_BASED_OUTPATIENT_CLINIC_OR_DEPARTMENT_OTHER): Payer: Medicare Other

## 2023-05-04 ENCOUNTER — Emergency Department (HOSPITAL_BASED_OUTPATIENT_CLINIC_OR_DEPARTMENT_OTHER)
Admission: EM | Admit: 2023-05-04 | Discharge: 2023-05-04 | Disposition: A | Discharge status: Home / Self Care | Payer: Medicare Other | Attending: Emergency Medicine | Admitting: Emergency Medicine

## 2023-05-04 DIAGNOSIS — R109 Unspecified abdominal pain: Secondary | ICD-10-CM | POA: Diagnosis not present

## 2023-05-04 DIAGNOSIS — Z79899 Other long term (current) drug therapy: Secondary | ICD-10-CM | POA: Diagnosis not present

## 2023-05-04 DIAGNOSIS — E860 Dehydration: Secondary | ICD-10-CM | POA: Diagnosis not present

## 2023-05-04 DIAGNOSIS — I700 Atherosclerosis of aorta: Secondary | ICD-10-CM | POA: Diagnosis not present

## 2023-05-04 DIAGNOSIS — R112 Nausea with vomiting, unspecified: Secondary | ICD-10-CM | POA: Diagnosis not present

## 2023-05-04 DIAGNOSIS — I10 Essential (primary) hypertension: Secondary | ICD-10-CM | POA: Insufficient documentation

## 2023-05-04 LAB — CBC WITH DIFFERENTIAL/PLATELET
Abs Immature Granulocytes: 0.03 10*3/uL (ref 0.00–0.07)
Basophils Absolute: 0 10*3/uL (ref 0.0–0.1)
Basophils Relative: 0 %
Eosinophils Absolute: 0 10*3/uL (ref 0.0–0.5)
Eosinophils Relative: 1 %
HCT: 38.9 % (ref 36.0–46.0)
Hemoglobin: 12.7 g/dL (ref 12.0–15.0)
Immature Granulocytes: 1 %
Lymphocytes Relative: 18 %
Lymphs Abs: 1.1 10*3/uL (ref 0.7–4.0)
MCH: 31.2 pg (ref 26.0–34.0)
MCHC: 32.6 g/dL (ref 30.0–36.0)
MCV: 95.6 fL (ref 80.0–100.0)
Monocytes Absolute: 0.8 10*3/uL (ref 0.1–1.0)
Monocytes Relative: 13 %
Neutro Abs: 4.2 10*3/uL (ref 1.7–7.7)
Neutrophils Relative %: 67 %
Platelets: 124 10*3/uL — ABNORMAL LOW (ref 150–400)
RBC: 4.07 MIL/uL (ref 3.87–5.11)
RDW: 14.2 % (ref 11.5–15.5)
WBC: 6.3 10*3/uL (ref 4.0–10.5)
nRBC: 0 % (ref 0.0–0.2)

## 2023-05-04 LAB — COMPREHENSIVE METABOLIC PANEL
ALT: 17 U/L (ref 0–44)
AST: 22 U/L (ref 15–41)
Albumin: 4.3 g/dL (ref 3.5–5.0)
Alkaline Phosphatase: 56 U/L (ref 38–126)
Anion gap: 9 (ref 5–15)
BUN: 17 mg/dL (ref 8–23)
CO2: 27 mmol/L (ref 22–32)
Calcium: 9.4 mg/dL (ref 8.9–10.3)
Chloride: 104 mmol/L (ref 98–111)
Creatinine, Ser: 0.73 mg/dL (ref 0.44–1.00)
GFR, Estimated: >60 mL/min (ref >60)
Glucose, Bld: 100 mg/dL — ABNORMAL HIGH (ref 70–99)
Potassium: 3.7 mmol/L (ref 3.5–5.1)
Sodium: 140 mmol/L (ref 135–145)
Total Bilirubin: 0.6 mg/dL (ref 0.3–1.2)
Total Protein: 6.8 g/dL (ref 6.5–8.1)

## 2023-05-04 LAB — URINALYSIS, ROUTINE W REFLEX MICROSCOPIC
Bilirubin Urine: NEGATIVE
Glucose, UA: NEGATIVE mg/dL
Hgb urine dipstick: NEGATIVE
Ketones, ur: 15 mg/dL — AB
Leukocytes,Ua: NEGATIVE
Nitrite: NEGATIVE
Specific Gravity, Urine: 1.017 (ref 1.005–1.030)
pH: 5.5 (ref 5.0–8.0)

## 2023-05-04 LAB — LIPASE, BLOOD: Lipase: 14 U/L (ref 11–51)

## 2023-05-04 MED ORDER — SODIUM CHLORIDE 0.9 % IV BOLUS
1000.0000 mL | Freq: Once | INTRAVENOUS | Status: AC
  Administered 2023-05-04: 1000 mL via INTRAVENOUS

## 2023-05-04 MED ORDER — DIPHENHYDRAMINE HCL 50 MG/ML IJ SOLN
12.5000 mg | Freq: Once | INTRAMUSCULAR | Status: AC
  Administered 2023-05-04: 12.5 mg via INTRAVENOUS
  Filled 2023-05-04: qty 1

## 2023-05-04 MED ORDER — IOHEXOL 300 MG/ML  SOLN
100.0000 mL | Freq: Once | INTRAMUSCULAR | Status: AC | PRN
  Administered 2023-05-04: 80 mL via INTRAVENOUS

## 2023-05-04 MED ORDER — METOCLOPRAMIDE HCL 5 MG/ML IJ SOLN
10.0000 mg | Freq: Once | INTRAMUSCULAR | Status: AC
  Administered 2023-05-04: 10 mg via INTRAVENOUS
  Filled 2023-05-04: qty 2

## 2023-05-04 MED ORDER — PROMETHAZINE HCL 12.5 MG PO TABS: 12.5000 mg | ORAL_TABLET | Freq: Four times a day (QID) | ORAL | 0 refills | Status: DC | PRN

## 2023-05-04 NOTE — ED Triage Notes (Signed)
Pt c/o NV since Wednesday. Pt states she thinks she "ate something." States that she has been trying to keep PO intake up. States she took zofran last night "but I don't think it helped." Zofran PTA, "but still has dry heaves."

## 2023-05-04 NOTE — ED Provider Notes (Signed)
[facility] DEPARTMENT AT [facility] Provider Note   CSN: [id number] Arrival date & time: 05/04/23  1151     History  Chief Complaint  Patient presents with   Emesis    Sheila Wolf is a 89 y.o. female.  Pt is a 89 yo female with pmhx significant for htn, gerd, anxiety and depression, arthritis, and osteoporosis.  Pt said she has had n/v since Wed, June 12.  She tried a zofran last night and this am.  It did help, but she still had dry heaves.  She has been unable to keep anything down.  She denies pain or diarrhea/constipation.  She lives at [facility] and they had a party.  She is not sure if she ate something bad or not, but no one else is sick.       Home Medications Prior to Admission medications   Medication Sig Start Date End Date Taking? Authorizing Provider  promethazine (PHENERGAN) 12.5 MG tablet Take 1 tablet (12.5 mg total) by mouth every 6 (six) hours as needed for nausea or vomiting. 05/04/23  Yes Jacalyn Lefevre, MD  amLODipine (NORVASC) 5 MG tablet Take 1 tablet (5 mg total) by mouth daily. 09/20/22   Nelwyn Salisbury, MD  atorvastatin (LIPITOR) 20 MG tablet Take 1 tablet (20 mg total) by mouth daily. 12/18/22   Nafziger, Kandee Keen, NP  butalbital-acetaminophen-caffeine (FIORICET) [phone] MG tablet TAKE 1 TABLET BY MOUTH 4 TIMES DAILY AS NEEDED FOR HEADACHE 05/02/23   Nafziger, Kandee Keen, NP  cetirizine (ZYRTEC) 10 MG tablet TAKE 1 TABLET BY MOUTH EVERY DAY 09/08/22   Nafziger, Kandee Keen, NP  cycloSPORINE (RESTASIS) 0.05 % ophthalmic emulsion 1 drop 2 (two) times daily.    [provider]  escitalopram (LEXAPRO) 20 MG tablet TAKE 1 TABLET BY MOUTH EVERY DAY 04/24/23   Nafziger, Kandee Keen, NP  Melatonin 5 MG CAPS Take by mouth.    [provider]  meloxicam (MOBIC) 15 MG tablet Take 1 tablet (15 mg total) by mouth daily. 11/09/22   Nafziger, Kandee Keen, NP  metoprolol succinate (TOPROL-XL) 50 MG 24 hr tablet TAKE 1 TABLET BY MOUTH EVERY DAY.  11/09/22   Shirline Frees, NP  nystatin-triamcinolone ointment [facility]) apply topically 2 TIMES DAILY 11/25/21   Shirline Frees, NP      Allergies    Penicillins    Review of Systems   Review of Systems  Gastrointestinal:  Positive for nausea and vomiting.  All other systems reviewed and are negative.   Physical Exam Updated Vital Signs BP (!) 168/69   Pulse 70   Temp 98 F (36.7 C)   Resp 20   SpO2 97%  Physical Exam Vitals and nursing note reviewed.  Constitutional:      Appearance: Normal appearance.  HENT:     Head: Normocephalic and atraumatic.     Right Ear: External ear normal.     Left Ear: External ear normal.     Nose: Nose normal.     Mouth/Throat:     Mouth: Mucous membranes are dry.  Eyes:     Extraocular Movements: Extraocular movements intact.     Conjunctiva/sclera: Conjunctivae normal.     Pupils: Pupils are equal, round, and reactive to light.  Cardiovascular:     Rate and Rhythm: Normal rate and regular rhythm.     Pulses: Normal pulses.     Heart sounds: Normal heart sounds.  Pulmonary:     Effort: Pulmonary effort is normal.     Breath  sounds: Normal breath sounds.  Abdominal:     General: Abdomen is flat. Bowel sounds are normal.     Palpations: Abdomen is soft.  Musculoskeletal:        General: Normal range of motion.     Cervical back: Normal range of motion and neck supple.  Skin:    General: Skin is warm.     Capillary Refill: Capillary refill takes less than 2 seconds.  Neurological:     General: No focal deficit present.     Mental Status: She is alert and oriented to person, place, and time.  Psychiatric:        Mood and Affect: Mood normal.        Behavior: Behavior normal.     ED Results / Procedures / Treatments   Labs (all labs ordered are listed, but only abnormal results are displayed) Labs Reviewed  CBC WITH DIFFERENTIAL/PLATELET - Abnormal; Notable for the following components:      Result Value   Platelets 124 (*)     All other components within normal limits  COMPREHENSIVE METABOLIC PANEL - Abnormal; Notable for the following components:   Glucose, Bld 100 (*)    All other components within normal limits  URINALYSIS, ROUTINE W REFLEX MICROSCOPIC - Abnormal; Notable for the following components:   Ketones, ur 15 (*)    Protein, ur TRACE (*)    All other components within normal limits  LIPASE, BLOOD    EKG EKG Interpretation  Date/Time:  Friday May 04 2023 13:03:48 EDT Ventricular Rate:  76 PR Interval:  152 QRS Duration: 88 QT Interval:  429 QTC Calculation: 483 R Axis:   -7 Text Interpretation: Sinus rhythm Probable anteroseptal infarct, old No significant change since last tracing Confirmed by Jacalyn Lefevre [phone]) on 05/04/2023 1:08:48 PM  Radiology CT ABDOMEN PELVIS W CONTRAST  Result Date: 05/04/2023 CLINICAL DATA:  Abdominal pain, acute, nonlocalized. EXAM: CT ABDOMEN AND PELVIS WITH CONTRAST TECHNIQUE: Multidetector CT imaging of the abdomen and pelvis was performed using the standard protocol following bolus administration of intravenous contrast. RADIATION DOSE REDUCTION: This exam was performed according to the departmental dose-optimization program which includes automated exposure control, adjustment of the mA and/or kV according to patient size and/or use of iterative reconstruction technique. CONTRAST:  80mL OMNIPAQUE IOHEXOL 300 MG/ML  SOLN COMPARISON:  None Available. FINDINGS: Lower chest: No acute abnormality. Hepatobiliary: No focal liver abnormality is seen. No gallstones, gallbladder wall thickening, or biliary dilatation. Pancreas: Unremarkable. No pancreatic ductal dilatation or surrounding inflammatory changes. Spleen: Normal. Adrenals/Urinary Tract: Adrenal glands are unremarkable. Kidneys are normal, without renal calculi, focal lesion, or hydronephrosis. Bladder is unremarkable. Stomach/Bowel: Normal stomach and duodenum. No dilated loops of small bowel. Appendix is not  visualized. No bowel wall thickening or surrounding inflammation. Vascular/Lymphatic: Aortic atherosclerosis. No enlarged abdominal or pelvic lymph nodes. Reproductive: Uterus and bilateral adnexa are unremarkable. Other: No abdominal wall hernia or abnormality. No abdominopelvic ascites. Musculoskeletal: No acute bone findings. Grade 1 anterolisthesis of L4 on L5 with prior left posterior spinal fusion at this level. IMPRESSION: No CT evidence of acute abdominal/pelvic process. Aortic Atherosclerosis (ICD10-I70.0). Electronically Signed   By: Orvan Falconer M.D.   On: 05/04/2023 14:50    Procedures Procedures    Medications Ordered in ED Medications  sodium chloride 0.9 % bolus 1,000 mL (0 mLs Intravenous Stopped 05/04/23 1434)  metoCLOPramide (REGLAN) injection 10 mg (10 mg Intravenous Given 05/04/23 1254)  diphenhydrAMINE (BENADRYL) injection 12.5 mg (12.5 mg Intravenous Given  05/04/23 1254)  iohexol (OMNIPAQUE) 300 MG/ML solution 100 mL (80 mLs Intravenous Contrast Given 05/04/23 1426)    ED Course/ Medical Decision Making/ A&P                             Medical Decision Making Amount and/or Complexity of Data Reviewed Labs: ordered. Radiology: ordered.  Risk Prescription drug management.   This patient presents to the ED for concern of n/v, this involves an extensive number of treatment options, and is a complaint that carries with it a high risk of complications and morbidity.  The differential diagnosis includes gastroenteritis, uti, cholecystitis, cardiac   Co morbidities that complicate the patient evaluation  htn, gerd, anxiety and depression, arthritis, and osteoporosis   Additional history obtained:  Additional history obtained from epic chart review External records from outside source obtained and reviewed including family   Lab Tests:  I Ordered, and personally interpreted labs.  The pertinent results include:  cbc nl, cmp nl, lip nl, ua nl   Imaging Studies  ordered:  I ordered imaging studies including ct abd/pelvis  I independently visualized and interpreted imaging which showed  No CT evidence of acute abdominal/pelvic process.    Aortic Atherosclerosis (ICD10-I70.0).   I agree with the radiologist interpretation   Cardiac Monitoring:  The patient was maintained on a cardiac monitor.  I personally viewed and interpreted the cardiac monitored which showed an underlying rhythm of: nsr   Medicines ordered and prescription drug management:  I ordered medication including benadryl, reglan, ivfs  for sx  Reevaluation of the patient after these medicines showed that the patient improved I have reviewed the patients home medicines and have made adjustments as needed   Test Considered:  ct   Critical Interventions:  ivfs   Problem List / ED Course:  N/v/dehydration: Pt is feeling much better.  She is able to tolerate fluids.  She is to return if worse.  F/u with pcp.   Reevaluation:  After the interventions noted above, I reevaluated the patient and found that they have :improved   Social Determinants of Health:  Lives at friend's home   Dispostion:  After consideration of the diagnostic results and the patients response to treatment, I feel that the patent would benefit from discharge with outpatient f/u.          Final Clinical Impression(s) / ED Diagnoses Final diagnoses:  Nausea and vomiting, unspecified vomiting type  Dehydration    Rx / DC Orders ED Discharge Orders          Ordered    promethazine (PHENERGAN) 12.5 MG tablet  Every 6 hours PRN        05/04/23 1526              Jacalyn Lefevre, MD 05/04/23 1530

## 2023-05-04 NOTE — ED Notes (Signed)
Provider requested to give Pt fluids... Fluids given... No N/V after the fluids that were given.Marland KitchenMarland Kitchen

## 2023-05-04 NOTE — ED Notes (Signed)
Pt aware of the need for a urine... Unable to currently provide a sample... 

## 2023-05-04 NOTE — ED Notes (Signed)
Discharge paperwork given and verbally understood. 

## 2023-05-08 ENCOUNTER — Encounter: Payer: Self-pay | Admitting: Adult Health

## 2023-05-08 ENCOUNTER — Ambulatory Visit (INDEPENDENT_AMBULATORY_CARE_PROVIDER_SITE_OTHER): Payer: Medicare Other | Admitting: Adult Health

## 2023-05-08 VITALS — BP 138/70 | HR 90 | Temp 98.2°F | Ht 63.0 in | Wt 124.0 lb

## 2023-05-08 DIAGNOSIS — R110 Nausea: Secondary | ICD-10-CM | POA: Diagnosis not present

## 2023-05-08 MED ORDER — ONDANSETRON HCL 4 MG PO TABS: 4.0000 mg | ORAL_TABLET | Freq: Three times a day (TID) | ORAL | 1 refills | Status: DC | PRN

## 2023-05-08 NOTE — Progress Notes (Signed)
Subjective:    Patient ID: Sheila Wolf, female    DOB: 13-Dec-1933, 89 y.o.   MRN: [id number]  HPI  89 year old female who  has a past medical history of Anxiety and depression, Arthritis, Frequent headaches, GERD (gastroesophageal reflux disease), Hypertension, Osteoporosis, Thrombocytopathia (HCC), and Urinary incontinence.  She is being evaluated today for follow-up after being seen in the emergency room 4 days ago.  She was seen for nausea and vomiting that started 2 days prior.  At home she tried Zofran and it helped but she continued to have dry heaves.  She was unable to keep anything down.  She denied abdominal pain, diarrhea/constipation.  ER her labs and CT abdomen pelvis were within normal limits.  She was given fluids and Reglan which helped improve her symptoms.  She was able to complete p.o. challenge and was discharged home.  Since being home she has continued to feel nauseated but has not thrown up. She has not been eating much at home nor drinking.    Review of Systems See HPI   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HCC)    Urinary incontinence     Social History   Socioeconomic History   Marital status: Widowed    Spouse name: Not on file   Number of children: Not on file   Years of education: Not on file   Highest education level: Not on file  Occupational History   Not on file  Tobacco Use   Smoking status: Former    Years: 10    Types: Cigarettes   Smokeless tobacco: Never  Vaping Use   Vaping Use: Never used  Substance and Sexual Activity   Alcohol use: Yes    Alcohol/week: 10.0 standard drinks of alcohol    Types: 10 Glasses of wine per week   Drug use: No   Sexual activity: Not Currently  Other Topics Concern   Not on file  Social History Narrative   Not on file    Social Determinants of Health   Financial Resource Strain: Low Risk  (07/10/2022)   Overall Financial Resource Strain (CARDIA)    Difficulty of Paying Living Expenses: Not hard at all  Food Insecurity: No Food Insecurity (07/10/2022)   Hunger Vital Sign    Worried About Running Out of Food in the Last Year: Never true    Ran Out of Food in the Last Year: Never true  Transportation Needs: No Transportation Needs (07/10/2022)   PRAPARE - Administrator, Civil Service (Medical): No    Lack of Transportation (Non-Medical): No  Physical Activity: Inactive (07/10/2022)   Exercise Vital Sign    Days of Exercise per Week: 0 days    Minutes of Exercise per Session: 0 min  Stress: No Stress Concern Present (07/10/2022)   [employer] of Occupational Health - Occupational Stress Questionnaire    Feeling of Stress : Not at all  Social Connections: Moderately Isolated (06/21/2021)   Social Connection and Isolation Panel [NHANES]    Frequency of Communication with Friends and Family: Three times a week    Frequency of Social Gatherings with Friends and Family: Three times a week    Attends Religious Services: 1 to 4 times per year    Active Member of Clubs or  Organizations: No    Attends Banker Meetings: Never    Marital Status: Widowed  Intimate Partner Violence: Not At Risk (06/21/2021)   Humiliation, Afraid, Rape, and Kick questionnaire    Fear of Current or Ex-Partner: No    Emotionally Abused: No    Physically Abused: No    Sexually Abused: No    Past Surgical History:  Procedure Laterality Date   APPENDECTOMY     BUNIONECTOMY     CERVICAL SPINE SURGERY     SHOULDER SURGERY     TONSILLECTOMY AND ADENOIDECTOMY      Family History  Problem Relation Age of Onset   Arthritis Mother    Hearing loss Mother    Heart disease Mother    Hypertension Mother    Alcohol abuse Father    Early death Father    Heart disease Father    Stroke Father    Birth  defects Brother    Heart disease Brother     Allergies  Allergen Reactions   Penicillins Rash    Current Outpatient Medications on File Prior to Visit  Medication Sig Dispense Refill   amLODipine (NORVASC) 5 MG tablet Take 1 tablet (5 mg total) by mouth daily. 90 tablet 0   atorvastatin (LIPITOR) 20 MG tablet Take 1 tablet (20 mg total) by mouth daily. 90 tablet 3   butalbital-acetaminophen-caffeine (FIORICET) 50-325-40 MG tablet TAKE 1 TABLET BY MOUTH 4 TIMES DAILY AS NEEDED FOR HEADACHE 60 tablet 1   cetirizine (ZYRTEC) 10 MG tablet TAKE 1 TABLET BY MOUTH EVERY DAY 90 tablet 3   cycloSPORINE (RESTASIS) 0.05 % ophthalmic emulsion 1 drop 2 (two) times daily.     escitalopram (LEXAPRO) 20 MG tablet TAKE 1 TABLET BY MOUTH EVERY DAY 30 tablet 0   Melatonin 5 MG CAPS Take by mouth.     meloxicam (MOBIC) 15 MG tablet Take 1 tablet (15 mg total) by mouth daily. 90 tablet 3   metoprolol succinate (TOPROL-XL) 50 MG 24 hr tablet TAKE 1 TABLET BY MOUTH EVERY DAY. 90 tablet 3   nystatin-triamcinolone ointment (MYCOLOG) apply topically 2 TIMES DAILY 30 g 1   No current facility-administered medications on file prior to visit.    BP 138/70   Pulse 90   Temp 98.2 F (36.8 C) (Oral)   Ht 5\' 3"  (1.6 m)   Wt 124 lb (56.2 kg)   SpO2 93%   BMI 21.97 kg/m       Objective:   Physical Exam Vitals and nursing note reviewed.  Constitutional:      Appearance: Normal appearance.  Cardiovascular:     Rate and Rhythm: Normal rate and regular rhythm.     Pulses: Normal pulses.     Heart sounds: Normal heart sounds.  Pulmonary:     Effort: Pulmonary effort is normal.     Breath sounds: Normal breath sounds.  Skin:    General: Skin is warm and dry.     Capillary Refill: Capillary refill takes less than 2 seconds.  Neurological:     General: No focal deficit present.     Mental Status: She is alert and oriented to person, place, and time.  Psychiatric:        Mood and Affect: Mood normal.         Behavior: Behavior normal.        Thought Content: Thought content normal.        Judgment: Judgment normal.  Assessment & Plan:  1. Nausea - Encouraged bland diet and to increase fluids. - Follow up as needed  - ondansetron (ZOFRAN) 4 MG tablet; Take 1 tablet (4 mg total) by mouth every 8 (eight) hours as needed for nausea or vomiting.  Dispense: 20 tablet; Refill: 1   Shirline Frees, NP

## 2023-05-09 ENCOUNTER — Telehealth: Payer: Self-pay

## 2023-05-09 NOTE — Telephone Encounter (Signed)
Transition Care Management Follow-up Telephone Call Date of discharge and from where: [facility] 6/14 How have you been since you were released from the hospital? Better but not 100% Any questions or concerns? No  Items Reviewed: Did the pt receive and understand the discharge instructions provided? Yes  Medications obtained and verified? Yes  Other? No  Any new allergies since your discharge? No  Dietary orders reviewed? No Do you have support at home? Yes    Follow up appointments reviewed:  PCP Hospital f/u appt confirmed? Yes  Scheduled to see pcp on 6/18 @ . Specialist Hospital f/u appt confirmed? No  Scheduled to see  on  @ . Are transportation arrangements needed? No  If their condition worsens, is the pt aware to call PCP or go to the Emergency Dept.? Yes Was the patient provided with contact information for the PCP's office or ED? Yes Was to pt encouraged to call back with questions or concerns? No

## 2023-05-16 ENCOUNTER — Other Ambulatory Visit: Payer: Self-pay | Admitting: Adult Health

## 2023-05-16 DIAGNOSIS — F419 Anxiety disorder, unspecified: Secondary | ICD-10-CM

## 2023-05-31 ENCOUNTER — Encounter: Payer: Self-pay | Admitting: Adult Health

## 2023-05-31 ENCOUNTER — Ambulatory Visit (INDEPENDENT_AMBULATORY_CARE_PROVIDER_SITE_OTHER): Payer: Medicare Other | Admitting: Adult Health

## 2023-05-31 VITALS — BP 132/70 | HR 65 | Temp 98.1°F | Ht 63.0 in | Wt 128.0 lb

## 2023-05-31 DIAGNOSIS — R4189 Other symptoms and signs involving cognitive functions and awareness: Secondary | ICD-10-CM | POA: Diagnosis not present

## 2023-05-31 DIAGNOSIS — L821 Other seborrheic keratosis: Secondary | ICD-10-CM

## 2023-05-31 NOTE — Progress Notes (Signed)
Subjective:    Patient ID: Sheila Wolf, female    DOB: 04-26-1934, 89 y.o.   MRN: [id number]  HPI 89 year old female who  has a past medical history of Anxiety and depression, Arthritis, Frequent headaches, GERD (gastroesophageal reflux disease), Hypertension, Osteoporosis, Thrombocytopathia (HCC), and Urinary incontinence.  She presents to the office today for an removal of skin neoplams on here neck and back. These neoplams have become aggravated by clothing and jewelry.   Her daughter who is with her today would also like for her to have a screening done for dementia. She reports cognitive impairment such as often forgetting which day of the is, needing help with directions while driving, and having trouble remembering what she did or what she ate the day before.  She is not leaving the stove on, getting lost while driving, or mixing up medications. She continues to live in an independent living facility      11/09/2022   11:15 AM  MMSE - Mini Mental State Exam  Orientation to time 5  Orientation to Place 5  Registration 3  Attention/ Calculation 5  Recall 3  Language- name 2 objects 2  Language- repeat 1  Language- follow 3 step command 3  Language- read & follow direction 1  Write a sentence 1  Copy design 1  Total score 30    Review of Systems See HPI  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HCC)    Urinary incontinence     Social History   Socioeconomic History   Marital status: Widowed    Spouse name: Not on file   Number of children: Not on file   Years of education: Not on file   Highest education level: Not on file  Occupational History   Not on file  Tobacco Use   Smoking status: Former    Types: Cigarettes   Smokeless tobacco: Never  Vaping Use   Vaping status: Never Used   Substance and Sexual Activity   Alcohol use: Yes    Alcohol/week: 10.0 standard drinks of alcohol    Types: 10 Glasses of wine per week   Drug use: No   Sexual activity: Not Currently  Other Topics Concern   Not on file  Social History Narrative   Not on file   Social Determinants of Health   Financial Resource Strain: Low Risk  (07/10/2022)   Overall Financial Resource Strain (CARDIA)    Difficulty of Paying Living Expenses: Not hard at all  Food Insecurity: No Food Insecurity (07/10/2022)   Hunger Vital Sign    Worried About Running Out of Food in the Last Year: Never true    Ran Out of Food in the Last Year: Never true  Transportation Needs: No Transportation Needs (07/10/2022)   PRAPARE - Administrator, Civil Service (Medical): No    Lack of Transportation (Non-Medical): No  Physical Activity: Inactive (07/10/2022)   Exercise Vital Sign    Days of Exercise per Week: 0 days    Minutes of Exercise per Session: 0 min  Stress: No Stress Concern Present (07/10/2022)   [employer] of Occupational Health - Occupational Stress Questionnaire    Feeling of Stress : Not at all  Social Connections: Unknown (03/30/2022)   Received [facility]  Social Network    Social Network: Not on file  Intimate Partner Violence: Unknown (03/30/2022)   Received from [facility]   HITS    Physically Hurt: Not on file    Insult or Talk Down To: Not on file    Threaten Physical Harm: Not on file    Scream or Curse: Not on file    Past Surgical History:  Procedure Laterality Date   APPENDECTOMY     BUNIONECTOMY     CERVICAL SPINE SURGERY     SHOULDER SURGERY     TONSILLECTOMY AND ADENOIDECTOMY      Family History  Problem Relation Age of Onset   Arthritis Mother    Hearing loss Mother    Heart disease Mother    Hypertension Mother    Alcohol abuse Father    Early death Father    Heart disease Father    Stroke Father    Birth defects Brother    Heart disease  Brother     Allergies  Allergen Reactions   Penicillins Rash    Current Outpatient Medications on File Prior to Visit  Medication Sig Dispense Refill   amLODipine (NORVASC) 5 MG tablet Take 1 tablet (5 mg total) by mouth daily. 90 tablet 0   atorvastatin (LIPITOR) 20 MG tablet Take 1 tablet (20 mg total) by mouth daily. 90 tablet 3   butalbital-acetaminophen-caffeine (FIORICET) 50-325-40 MG tablet TAKE 1 TABLET BY MOUTH 4 TIMES DAILY AS NEEDED FOR HEADACHE 60 tablet 1   cetirizine (ZYRTEC) 10 MG tablet TAKE 1 TABLET BY MOUTH EVERY DAY 90 tablet 3   cycloSPORINE (RESTASIS) 0.05 % ophthalmic emulsion 1 drop 2 (two) times daily.     escitalopram (LEXAPRO) 20 MG tablet TAKE 1 TABLET BY MOUTH EVERY DAY 30 tablet 0   Melatonin 5 MG CAPS Take by mouth.     meloxicam (MOBIC) 15 MG tablet Take 1 tablet (15 mg total) by mouth daily. 90 tablet 3   metoprolol succinate (TOPROL-XL) 50 MG 24 hr tablet TAKE 1 TABLET BY MOUTH EVERY DAY. 90 tablet 3   nystatin-triamcinolone ointment (MYCOLOG) apply topically 2 TIMES DAILY 30 g 1   ondansetron (ZOFRAN) 4 MG tablet Take 1 tablet (4 mg total) by mouth every 8 (eight) hours as needed for nausea or vomiting. 20 tablet 1   No current facility-administered medications on file prior to visit.    BP 132/70   Pulse 65   Temp 98.1 F (36.7 C) (Oral)   Ht 5\' 3"  (1.6 m)   Wt 128 lb (58.1 kg)   SpO2 97%   BMI 22.67 kg/m       Objective:   Physical Exam Vitals and nursing note reviewed.  Constitutional:      Appearance: Normal appearance.  Cardiovascular:     Rate and Rhythm: Normal rate and regular rhythm.     Pulses: Normal pulses.     Heart sounds: Normal heart sounds.  Pulmonary:     Effort: Pulmonary effort is normal.     Breath sounds: Normal breath sounds.  Musculoskeletal:        General: Normal range of motion.  Skin:    General: Skin is warm and dry.     Comments: Three seborrheic keratosis noted on right shoulder and chest wall. She  has two larger seborrheic keratosis noted on mid back at bra line  Neurological:     General: No focal deficit present.     Mental Status: She is alert  and oriented to person, place, and time.  Psychiatric:        Mood and Affect: Mood normal.        Behavior: Behavior normal.        Thought Content: Thought content normal.        Judgment: Judgment normal.       Assessment & Plan:  1. Seborrheic keratosis -Verbal consent obtained. Using liquid nitrogen, each keratosis he was frozen using 3 freeze thaw cycles.  Patient tolerated procedure well.  Advised of at home instructions as well as follow-up if needed  2. Cognitive impairment - MMSE today 30/30 - Likely related cognitive decline.  We discussed getting an MRI but did not think that this would be warranted at this time. - Follow up as needed  Shirline Frees, NP

## 2023-06-19 ENCOUNTER — Other Ambulatory Visit: Payer: Self-pay | Admitting: Adult Health

## 2023-06-19 DIAGNOSIS — F32A Depression, unspecified: Secondary | ICD-10-CM

## 2023-07-11 ENCOUNTER — Other Ambulatory Visit: Payer: Self-pay | Admitting: Adult Health

## 2023-07-11 DIAGNOSIS — R519 Headache, unspecified: Secondary | ICD-10-CM

## 2023-07-12 ENCOUNTER — Telehealth (INDEPENDENT_AMBULATORY_CARE_PROVIDER_SITE_OTHER): Payer: Medicare Other | Admitting: Family Medicine

## 2023-07-12 ENCOUNTER — Encounter: Payer: Self-pay | Admitting: Family Medicine

## 2023-07-12 VITALS — Ht 63.0 in | Wt 128.0 lb

## 2023-07-12 DIAGNOSIS — Z0000 Encounter for general adult medical examination without abnormal findings: Secondary | ICD-10-CM | POA: Diagnosis not present

## 2023-07-12 NOTE — Patient Instructions (Signed)
I really enjoyed getting to talk with you today! I am available on Tuesdays and Thursdays for virtual visits if you have any questions or concerns, or if I can be of any further assistance.   CHECKLIST FROM ANNUAL WELLNESS VISIT:  -Follow up (please call to schedule if not scheduled after visit):   -yearly for annual wellness visit with primary care office  Here is a list of your preventive care/health maintenance measures and the plan for each if any are due:  PLAN For any measures below that may be due:  -can get the vaccines at the pharmacy - let us know when you do so that we can update them in your chart  Health Maintenance  Topic Date Due   DTaP/Tdap/Td (1 - Tdap) Never done   COVID-19 Vaccine (4 - 2023-24 season) 07/21/2022   INFLUENZA VACCINE  06/21/2023   Medicare Annual Wellness (AWV)  07/11/2024   Pneumonia Vaccine 53+ Years old  Completed   DEXA SCAN  Completed   Zoster Vaccines- Shingrix  Completed   HPV VACCINES  Aged Out    -See a dentist at least yearly  -Get your eyes checked and then per your eye specialist's recommendations  -Other issues addressed today:   -I have included below further information regarding a healthy whole foods based diet, physical activity guidelines for adults, stress management and opportunities for social connections. I hope you find this information useful.   -----------------------------------------------------------------------------------------------------------------------------------------------------------------------------------------------------------------------------------------------------------  NUTRITION: -eat real food: lots of colorful vegetables (half the plate) and fruits -5-7 servings of vegetables and fruits per day (fresh or steamed is best), exp. 2 servings of vegetables with lunch and dinner and 2 servings of fruit per day. Berries and greens such as kale and collards are great choices.  -consume on a regular  basis: whole grains (make sure first ingredient on label contains the word "whole"), fresh fruits, fish, nuts, seeds, healthy oils (such as olive oil, avocado oil, grape seed oil) -may eat small amounts of dairy and lean meat on occasion, but avoid processed meats such as ham, bacon, lunch meat, etc. -drink water -try to avoid fast food and pre-packaged foods, processed meat -most experts advise limiting sodium to < 2300mg  per day, should limit further is any chronic conditions such as high blood pressure, heart disease, diabetes, etc. The American Heart Association advised that < 1500mg  is is ideal -try to avoid foods that contain any ingredients with names you do not recognize  -try to avoid sugar/sweets (except for the natural sugar that occurs in fresh fruit) -try to avoid sweet drinks -try to avoid white rice, white bread, pasta (unless whole grain), white or yellow potatoes  EXERCISE GUIDELINES FOR ADULTS: -if you wish to increase your physical activity, do so gradually and with the approval of your doctor -STOP and seek medical care immediately if you have any chest pain, chest discomfort or trouble breathing when starting or increasing exercise  -move and stretch your body, legs, feet and arms when sitting for long periods -Physical activity guidelines for optimal health in adults: -least 150 minutes per week of aerobic exercise (can talk, but not sing) once approved by your doctor, 20-30 minutes of sustained activity or two 10 minute episodes of sustained activity every day.  -resistance training at least 2 days per week if approved by your doctor -balance exercises 3+ days per week:   Stand somewhere where you have something sturdy to hold onto if you lose balance.    1) lift  up on toes, start with 5x per day and work up to 20x   2) stand and lift on leg straight out to the side so that foot is a few inches of the floor, start with 5x each side and work up to 20x each side   3) stand  on one foot, start with 5 seconds each side and work up to 20 seconds on each side  If you need ideas or help with getting more active:  -Silver sneakers https://tools.silversneakers.com  -Walk with a Doc: http://www.duncan-williams.com/  -try to include resistance (weight lifting/strength building) and balance exercises twice per week: or the following link for ideas: http://castillo-powell.com/  BuyDucts.dk  STRESS MANAGEMENT: -can try meditating, or just sitting quietly with deep breathing while intentionally relaxing all parts of your body for 5 minutes daily -if you need further help with stress, anxiety or depression please follow up with your primary doctor or contact the wonderful folks at [facility]: [phone]  SOCIAL CONNECTIONS: -options in [place] if you wish to engage in more social and exercise related activities:  -Silver sneakers https://tools.silversneakers.com  -Walk with a Doc: http://www.duncan-williams.com/  -Check out the [facility] Active Adults 50+ section on the [place] of [employer] (hiking clubs, book clubs, cards and games, chess, exercise classes, aquatic classes and much more) - see the website for details: https://www.[place]-.gov/departments/parks-recreation/active-adults50  -YouTube has lots of exercise videos for different ages and abilities as well  -[facility] (a variety of indoor and outdoor inperson activities for adults). ([phone]. [address].  -Virtual Online Classes (a variety of topics): see seniorplanet.org or call [phone]  -consider volunteering at a school, hospice center, church, senior center or elsewhere

## 2023-07-12 NOTE — Progress Notes (Signed)
PATIENT CHECK-IN and HEALTH RISK ASSESSMENT QUESTIONNAIRE:  -completed by phone/video for upcoming Medicare Preventive Visit  Pre-Visit Check-in: 1)Vitals (height, wt, BP, etc) - record in vitals section for visit on day of visit Request home vitals (wt, BP, etc.) and enter into vitals, THEN update Vital Signs SmartPhrase below at the top of the HPI. See below.  2)Review and Update Medications, Allergies PMH, Surgeries, Social history in Epic 3)Hospitalizations in the last year with date/reason? Unknown   4)Review and Update Care Team (patient's specialists) in Epic 5) Complete PHQ9 in Epic  6) Complete Fall Screening in Epic 7)Review all Health Maintenance Due and order under PCP if not done.  8)Medicare Wellness Questionnaire: Answer theses question about your habits: Do you drink alcohol? Yes If yes, how many drinks do you have a day? 1 drink rarely Have you ever smoked?Yes Quit date if applicable? 40 years   How many packs a day do/did you smoke? Few a day Do you use smokeless tobacco? No Do you use an illicit drugs? No Do you exercises? No - but she report she wants to. Reports her balance is good and she gets around well.  Are you sexually active? No Number of partners? N/A Eats breakfast and lunch in her apartment and then [facility] Typical breakfast: Varies  Typical lunch: Varies Typical dinner: Varies  Typical snacks: Triscuit   Beverages: Ginger ale  Answer theses question about you: Can you perform most household chores?Yes Do you find it hard to follow a conversation in a noisy room? No Do you often ask people to speak up or repeat themselves? Sometimes - but is not bad, has had check ups Do you feel that you have a problem with memory? Yes a little - but feels is ok for her age. She remembers important things.  Do you balance your checkbook and or bank acounts? No Do you feel safe at home? Yes  Last dentist visit? 1 year ago Do you need  assistance with any of the following: Please note if so No  Driving?  Feeding yourself?  Getting from bed to chair?   Getting to the toilet?  Bathing or showering?  Dressing yourself?  Managing money?  Climbing a flight of stairs   Preparing meals?  Do you have Advanced Directives in place (Living Will, Healthcare Power or Attorney)?  Yes   Last eye Exam and location? Unknown   Do you currently use prescribed or non-prescribed narcotic or opioid pain medications? No  Do you have a history or close family history of breast, ovarian, tubal or peritoneal cancer or a family member with BRCA (breast cancer susceptibility 1 and 2) gene mutations? Yes Aunt   Request home vitals (wt, BP, etc.) and enter into vitals, THEN update Vital Signs SmartPhrase below at the top of the HPI. See below.   Nurse/Assistant Credentials/time stamp: Mg 2:37 AM    ----------------------------------------------------------------------------------------------------------------------------------------------------------------------------------------------------------------------  Vital Signs: Unable to obtain new vitals due to this being a telehealth visit.   MEDICARE ANNUAL PREVENTIVE VISIT WITH PROVIDER: (Welcome to Medicare, initial annual wellness or annual wellness exam)  Virtual Visit via Phone Note  I connected with Sheila Wolf on 07/12/23 by phone and verified that I am speaking with the correct person using two identifiers.  Location patient: home Location provider:work or home office Persons participating in the virtual visit: patient, provider  Concerns and/or follow up today: doing ok. No concerns. At [facility] Has good social interactions.  See HM section in Epic for other details of completed HM.    ROS: negative for report of fevers, unintentional weight loss, vision changes, vision loss, hearing loss or change, chest pain, sob, hemoptysis, melena,  hematochezia, hematuria, falls, bleeding or bruising, thoughts of suicide or self harm, memory loss  Patient-completed extensive health risk assessment - reviewed and discussed with the patient: See Health Risk Assessment completed with patient prior to the visit either above or in recent phone note. This was reviewed in detailed with the patient today and appropriate recommendations, orders and referrals were placed as needed per Summary below and patient instructions.   Review of Medical History: -PMH, PSH, Family History and current specialty and care providers reviewed and updated and listed below   Patient Care Team: Shirline Frees, NP as PCP - General (Family Medicine) Durene Romans, MD as Consulting Physician (Orthopedic Surgery)   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HCC)    Urinary incontinence     Past Surgical History:  Procedure Laterality Date   APPENDECTOMY     BUNIONECTOMY     CERVICAL SPINE SURGERY     SHOULDER SURGERY     TONSILLECTOMY AND ADENOIDECTOMY      Social History   Socioeconomic History   Marital status: Widowed    Spouse name: Not on file   Number of children: Not on file   Years of education: Not on file   Highest education level: Not on file  Occupational History   Not on file  Tobacco Use   Smoking status: Former    Types: Cigarettes   Smokeless tobacco: Never  Vaping Use   Vaping status: Never Used  Substance and Sexual Activity   Alcohol use: Yes    Alcohol/week: 10.0 standard drinks of alcohol    Types: 10 Glasses of wine per week   Drug use: No   Sexual activity: Not Currently  Other Topics Concern   Not on file  Social History Narrative   Not on file   Social Determinants of Health   Financial Resource Strain: Low Risk  (07/12/2023)   Overall Financial Resource  Strain (CARDIA)    Difficulty of Paying Living Expenses: Not hard at all  Food Insecurity: No Food Insecurity (07/12/2023)   Hunger Vital Sign    Worried About Running Out of Food in the Last Year: Never true    Ran Out of Food in the Last Year: Never true  Transportation Needs: No Transportation Needs (07/12/2023)   PRAPARE - Administrator, Civil Service (Medical): No    Lack of Transportation (Non-Medical): No  Physical Activity: Inactive (07/12/2023)   Exercise Vital Sign    Days of Exercise per Week: 0 days    Minutes of Exercise per Session: 0 min  Stress: No Stress Concern Present (07/12/2023)   [employer] of Occupational Health - Occupational Stress Questionnaire    Feeling of Stress : Not at all  Social Connections: Moderately Isolated (07/12/2023)   Social Connection and Isolation Panel [NHANES]    Frequency of Communication with Friends and Family: More than three times a week    Frequency of Social Gatherings with Friends and Family: Once a week    Attends Religious Services: More than 4 times per year    Active Member  of Clubs or Organizations: No    Attends Banker Meetings: Never    Marital Status: Widowed  Intimate Partner Violence: Not At Risk (07/12/2023)   Humiliation, Afraid, Rape, and Kick questionnaire    Fear of Current or Ex-Partner: No    Emotionally Abused: No    Physically Abused: No    Sexually Abused: No    Family History  Problem Relation Age of Onset   Arthritis Mother    Hearing loss Mother    Heart disease Mother    Hypertension Mother    Alcohol abuse Father    Early death Father    Heart disease Father    Stroke Father    Birth defects Brother    Heart disease Brother     Current Outpatient Medications on File Prior to Visit  Medication Sig Dispense Refill   amLODipine (NORVASC) 5 MG tablet Take 1 tablet (5 mg total) by mouth daily. 90 tablet 0   atorvastatin (LIPITOR) 20 MG tablet Take 1 tablet (20 mg  total) by mouth daily. 90 tablet 3   butalbital-acetaminophen-caffeine (FIORICET) 50-325-40 MG tablet TAKE 1 TABLET BY MOUTH 4 TIMES DAILY AS NEEDED FOR HEADACHE 60 tablet 1   cetirizine (ZYRTEC) 10 MG tablet TAKE 1 TABLET BY MOUTH EVERY DAY 90 tablet 3   cycloSPORINE (RESTASIS) 0.05 % ophthalmic emulsion 1 drop 2 (two) times daily.     escitalopram (LEXAPRO) 20 MG tablet TAKE 1 TABLET BY MOUTH EVERY DAY 30 tablet 0   Melatonin 5 MG CAPS Take by mouth.     meloxicam (MOBIC) 15 MG tablet Take 1 tablet (15 mg total) by mouth daily. 90 tablet 3   metoprolol succinate (TOPROL-XL) 50 MG 24 hr tablet TAKE 1 TABLET BY MOUTH EVERY DAY. 90 tablet 3   nystatin-triamcinolone ointment (MYCOLOG) apply topically 2 TIMES DAILY 30 g 1   ondansetron (ZOFRAN) 4 MG tablet Take 1 tablet (4 mg total) by mouth every 8 (eight) hours as needed for nausea or vomiting. 20 tablet 1   No current facility-administered medications on file prior to visit.    Allergies  Allergen Reactions   Penicillins Rash       Physical Exam Vitals requested from patient and listed below if patient had equipment and was able to obtain at home for this virtual visit: There were no vitals filed for this visit. Estimated body mass index is 22.67 kg/m as calculated from the following:   Height as of this encounter: 5\' 3"  (1.6 m).   Weight as of this encounter: 128 lb (58.1 kg).  EKG (optional): deferred due to virtual visit  GENERAL: alert, oriented, no acute distress detected, full vision exam deferred due to pandemic and/or virtual encounter  PSYCH/NEURO: pleasant and cooperative, no obvious depression or anxiety, speech and thought processing grossly intact, Cognitive function grossly intact  Flowsheet Row Video Visit [facility]12/2023 [facility]  PHQ-9 Total Score 0           07/12/2023    2:21 PM 07/10/2022   10:37 AM 06/26/2022    2:42 PM 02/07/2022    2:26 PM 06/21/2021   10:37 AM   Depression screen PHQ 2/9  Decreased Interest 0 0 0 0 0  Down, Depressed, Hopeless 0 0 0 0 0  PHQ - 2 Score 0 0 0 0 0  Altered sleeping 0  0 0   Tired, decreased energy 0  0 0   Change in appetite  0  0 0   Feeling bad or failure about yourself  0  0 0   Trouble concentrating 0  0 0   Moving slowly or fidgety/restless 0  0 0   Suicidal thoughts 0  0 0   PHQ-9 Score 0  0 0   Difficult doing work/chores Not difficult at all   Not difficult at all        06/21/2021   10:37 AM 02/07/2022    2:26 PM 06/26/2022    2:42 PM 07/10/2022   10:36 AM 07/12/2023    2:21 PM  Fall Risk  Falls in the past year? 0 1 0 0 0  Was there an injury with Fall? 0 0 0 0 0  Fall Risk Category Calculator 0 1 0 0 0  Fall Risk Category (Retired) Low Low Low Low   (RETIRED) Patient Fall Risk Level Low fall risk Low fall risk Low fall risk    Patient at Risk for Falls Due to  No Fall Risks No Fall Risks Medication side effect No Fall Risks  Fall risk Follow up Falls evaluation completed Falls evaluation completed Falls evaluation completed Falls evaluation completed;Education provided;Falls prevention discussed Falls evaluation completed     SUMMARY AND PLAN:  Encounter for Medicare annual wellness exam   Discussed applicable health maintenance/preventive health measures and advised and referred or ordered per patient preferences: -discussed vaccines due and recommendations, she plans to get theses at the pharmacy Health Maintenance  Topic Date Due   DTaP/Tdap/Td (1 - Tdap) Never done   COVID-19 Vaccine (4 - 2023-24 season) 07/21/2022   INFLUENZA VACCINE  06/21/2023   Medicare Annual Wellness (AWV)  07/11/2024   Pneumonia Vaccine 63+ Years old  Completed   DEXA SCAN  Completed   Zoster Vaccines- Shingrix  Completed   HPV VACCINES  Aged Out    Education and counseling on the following was provided based on the above review of health and a plan/checklist for the patient, along with additional information  discussed, was provided for the patient in the patient instructions :   -Provided counseling and plan for difficulty hearing - she does not feel need anything at this time -Provided counseling and plan for increased risk of falling if applicable per above screening. Reviewed and demonstrated safe balance exercises that can be done at home to improve balance and discussed exercise guidelines for adults with include balance exercises at least 3 days per week. Advised to use her cane.  -Advised and counseled on a healthy lifestyle - including the importance of a healthy diet, regular physical activity, social connections and stress management. -Reviewed patient's current diet. Advised and counseled on a whole foods based healthy diet. A summary of a healthy diet was provided in the Patient Instructions.  -reviewed patient's current physical activity level and discussed exercise guidelines for adults. Discussed community resources and ideas for safe exercise at home to assist in meeting exercise guideline recommendations in a safe and healthy way. She plans to add some exercise.  -Advise yearly dental visits at minimum and regular eye exams -Advised and counseled on alcohol safe limits, risks  Follow up: see patient instructions     Patient Instructions  I really enjoyed getting to talk with you today! I am available on Tuesdays and Thursdays for virtual visits if you have any questions or concerns, or if I can be of any further assistance.   CHECKLIST FROM ANNUAL WELLNESS VISIT:  -Follow up (please call to schedule  if not scheduled after visit):   -yearly for annual wellness visit with primary care office  Here is a list of your preventive care/health maintenance measures and the plan for each if any are due:  PLAN For any measures below that may be due:  -can get the vaccines at the pharmacy - let us know when you do so that we can update them in your chart  Health Maintenance  Topic Date  Due   DTaP/Tdap/Td (1 - Tdap) Never done   COVID-19 Vaccine (4 - 2023-24 season) 07/21/2022   INFLUENZA VACCINE  06/21/2023   Medicare Annual Wellness (AWV)  07/11/2024   Pneumonia Vaccine 5+ Years old  Completed   DEXA SCAN  Completed   Zoster Vaccines- Shingrix  Completed   HPV VACCINES  Aged Out    -See a dentist at least yearly  -Get your eyes checked and then per your eye specialist's recommendations  -Other issues addressed today:   -I have included below further information regarding a healthy whole foods based diet, physical activity guidelines for adults, stress management and opportunities for social connections. I hope you find this information useful.   -----------------------------------------------------------------------------------------------------------------------------------------------------------------------------------------------------------------------------------------------------------  NUTRITION: -eat real food: lots of colorful vegetables (half the plate) and fruits -5-7 servings of vegetables and fruits per day (fresh or steamed is best), exp. 2 servings of vegetables with lunch and dinner and 2 servings of fruit per day. Berries and greens such as kale and collards are great choices.  -consume on a regular basis: whole grains (make sure first ingredient on label contains the word "whole"), fresh fruits, fish, nuts, seeds, healthy oils (such as olive oil, avocado oil, grape seed oil) -may eat small amounts of dairy and lean meat on occasion, but avoid processed meats such as ham, bacon, lunch meat, etc. -drink water -try to avoid fast food and pre-packaged foods, processed meat -most experts advise limiting sodium to < 2300mg  per day, should limit further is any chronic conditions such as high blood pressure, heart disease, diabetes, etc. The American Heart Association advised that < 1500mg  is is ideal -try to avoid foods that contain any ingredients with  names you do not recognize  -try to avoid sugar/sweets (except for the natural sugar that occurs in fresh fruit) -try to avoid sweet drinks -try to avoid white rice, white bread, pasta (unless whole grain), white or yellow potatoes  EXERCISE GUIDELINES FOR ADULTS: -if you wish to increase your physical activity, do so gradually and with the approval of your doctor -STOP and seek medical care immediately if you have any chest pain, chest discomfort or trouble breathing when starting or increasing exercise  -move and stretch your body, legs, feet and arms when sitting for long periods -Physical activity guidelines for optimal health in adults: -least 150 minutes per week of aerobic exercise (can talk, but not sing) once approved by your doctor, 20-30 minutes of sustained activity or two 10 minute episodes of sustained activity every day.  -resistance training at least 2 days per week if approved by your doctor -balance exercises 3+ days per week:   Stand somewhere where you have something sturdy to hold onto if you lose balance.    1) lift up on toes, start with 5x per day and work up to 20x   2) stand and lift on leg straight out to the side so that foot is a few inches of the floor, start with 5x each side and work up to 20x each side  3) stand on one foot, start with 5 seconds each side and work up to 20 seconds on each side  If you need ideas or help with getting more active:  -Silver sneakers https://tools.silversneakers.com  -Walk with a Doc: http://www.duncan-williams.com/  -try to include resistance (weight lifting/strength building) and balance exercises twice per week: or the following link for ideas: http://castillo-powell.com/  BuyDucts.dk  STRESS MANAGEMENT: -can try meditating, or just sitting quietly with deep breathing while intentionally relaxing all parts of your body for 5 minutes  daily -if you need further help with stress, anxiety or depression please follow up with your primary doctor or contact the wonderful folks at [facility]: [phone]  SOCIAL CONNECTIONS: -options in [place] if you wish to engage in more social and exercise related activities:  -Silver sneakers https://tools.silversneakers.com  -Walk with a Doc: http://www.duncan-williams.com/  -Check out the [facility] Active Adults 50+ section on the [place] of [employer] (hiking clubs, book clubs, cards and games, chess, exercise classes, aquatic classes and much more) - see the website for details: https://www.[place].gov/departments/parks-recreation/active-adults50  -YouTube has lots of exercise videos for different ages and abilities as well  -[facility] (a variety of indoor and outdoor inperson activities for adults). [phone]. [address].  -Virtual Online Classes (a variety of topics): see seniorplanet.org or call ([phone]) [phone]  -consider volunteering at a school, hospice center, church, senior center or elsewhere           Terressa Koyanagi, DO

## 2023-07-13 NOTE — Telephone Encounter (Signed)
Okay for refill?  

## 2023-08-13 ENCOUNTER — Other Ambulatory Visit: Payer: Self-pay

## 2023-08-13 DIAGNOSIS — F32A Depression, unspecified: Secondary | ICD-10-CM

## 2023-08-13 MED ORDER — ESCITALOPRAM OXALATE 20 MG PO TABS
20.0000 mg | ORAL_TABLET | Freq: Every day | ORAL | 0 refills | Status: DC
Start: 2023-08-13 — End: 2023-09-05

## 2023-08-21 DIAGNOSIS — Z23 Encounter for immunization: Secondary | ICD-10-CM | POA: Diagnosis not present

## 2023-09-04 ENCOUNTER — Other Ambulatory Visit: Payer: Self-pay | Admitting: Adult Health

## 2023-09-04 DIAGNOSIS — F419 Anxiety disorder, unspecified: Secondary | ICD-10-CM

## 2023-09-11 ENCOUNTER — Other Ambulatory Visit: Payer: Self-pay | Admitting: Adult Health

## 2023-09-28 ENCOUNTER — Other Ambulatory Visit: Payer: Self-pay | Admitting: Adult Health

## 2023-09-28 DIAGNOSIS — R519 Headache, unspecified: Secondary | ICD-10-CM

## 2023-09-28 NOTE — Telephone Encounter (Signed)
Okay for refill?  

## 2023-10-04 ENCOUNTER — Encounter: Payer: Self-pay | Admitting: Adult Health

## 2023-10-04 ENCOUNTER — Ambulatory Visit: Payer: Medicare Other | Admitting: Adult Health

## 2023-10-04 VITALS — BP 130/80 | HR 63 | Temp 98.4°F | Ht 60.0 in | Wt 131.0 lb

## 2023-10-04 DIAGNOSIS — M19042 Primary osteoarthritis, left hand: Secondary | ICD-10-CM

## 2023-10-04 NOTE — Progress Notes (Signed)
Subjective:    Patient ID: Sheila Wolf, female    DOB: 06-03-1934, 89 y.o.   MRN: [id number]  HPI  89 year old female who  has a past medical history of Anxiety and depression, Arthritis, Frequent headaches, GERD (gastroesophageal reflux disease), Hypertension, Osteoporosis, Thrombocytopathia (HCC), and Urinary incontinence.  She presents to the office today for an acute issue.  She reports pain to her left fifth digit x 2 weeks.  She denies any trauma or aggravating injury.  She noticed "a knot" in the middle of her finger and became concerned.  At home she has been using Tylenol and meloxicam with some relief.  Review of Systems See HPI   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HCC)    Urinary incontinence     Social History   Socioeconomic History   Marital status: Widowed    Spouse name: Not on file   Number of children: Not on file   Years of education: Not on file   Highest education level: Not on file  Occupational History   Not on file  Tobacco Use   Smoking status: Former    Types: Cigarettes   Smokeless tobacco: Never  Vaping Use   Vaping status: Never Used  Substance and Sexual Activity   Alcohol use: Yes    Alcohol/week: 10.0 standard drinks of alcohol    Types: 10 Glasses of wine per week   Drug use: No   Sexual activity: Not Currently  Other Topics Concern   Not on file  Social History Narrative   Not on file   Social Determinants of Health   Financial Resource Strain: Low Risk  (07/12/2023)   Overall Financial Resource Strain (CARDIA)    Difficulty of Paying Living Expenses: Not hard at all  Food Insecurity: No Food Insecurity (07/12/2023)   Hunger Vital Sign    Worried About Running Out of Food in the Last Year: Never true    Ran Out of Food in the Last Year: Never true   Transportation Needs: No Transportation Needs (07/12/2023)   PRAPARE - Administrator, Civil Service (Medical): No    Lack of Transportation (Non-Medical): No  Physical Activity: Inactive (07/12/2023)   Exercise Vital Sign    Days of Exercise per Week: 0 days    Minutes of Exercise per Session: 0 min  Stress: No Stress Concern Present (07/12/2023)   [employer] of Occupational Health - Occupational Stress Questionnaire    Feeling of Stress : Not at all  Social Connections: Moderately Isolated (07/12/2023)   Social Connection and Isolation Panel [NHANES]    Frequency of Communication with Friends and Family: More than three times a week    Frequency of Social Gatherings with Friends and Family: Once a week    Attends Religious Services: More than 4 times per year    Active Member of [employer] or Organizations: No    Attends Banker Meetings: Never    Marital Status: Widowed  Intimate Partner Violence: Not At Risk (07/12/2023)   Humiliation, Afraid, Rape, and Kick questionnaire    Fear of Current or Ex-Partner: No    Emotionally Abused: No    Physically Abused: No    Sexually Abused: No    Past Surgical History:  Procedure Laterality Date  APPENDECTOMY     BUNIONECTOMY     CERVICAL SPINE SURGERY     SHOULDER SURGERY     TONSILLECTOMY AND ADENOIDECTOMY      Family History  Problem Relation Age of Onset   Arthritis Mother    Hearing loss Mother    Heart disease Mother    Hypertension Mother    Alcohol abuse Father    Early death Father    Heart disease Father    Stroke Father    Birth defects Brother    Heart disease Brother     Allergies  Allergen Reactions   Penicillins Rash    Current Outpatient Medications on File Prior to Visit  Medication Sig Dispense Refill   amLODipine (NORVASC) 5 MG tablet Take 1 tablet (5 mg total) by mouth daily. 90 tablet 0   atorvastatin (LIPITOR) 20 MG tablet Take 1 tablet (20 mg total) by mouth daily. 90  tablet 3   butalbital-acetaminophen-caffeine (FIORICET) 50-325-40 MG tablet TAKE 1 TABLET BY MOUTH 4 TIMES DAILY AS NEEDED FOR HEADACHE 60 tablet 2   cetirizine (ZYRTEC) 10 MG tablet TAKE 1 TABLET BY MOUTH EVERY DAY 90 tablet 3   cycloSPORINE (RESTASIS) 0.05 % ophthalmic emulsion 1 drop 2 (two) times daily.     escitalopram (LEXAPRO) 20 MG tablet TAKE 1 TABLET BY MOUTH ONCE DAILY 30 tablet 0   Melatonin 5 MG CAPS Take by mouth.     meloxicam (MOBIC) 15 MG tablet Take 1 tablet (15 mg total) by mouth daily. 90 tablet 3   metoprolol succinate (TOPROL-XL) 50 MG 24 hr tablet TAKE 1 TABLET BY MOUTH EVERY DAY. 90 tablet 3   nystatin-triamcinolone ointment (MYCOLOG) apply topically 2 TIMES DAILY 30 g 1   ondansetron (ZOFRAN) 4 MG tablet Take 1 tablet (4 mg total) by mouth every 8 (eight) hours as needed for nausea or vomiting. 20 tablet 1   No current facility-administered medications on file prior to visit.    BP 130/80   Pulse 63   Temp 98.4 F (36.9 C) (Oral)   Ht 5' (1.524 m)   Wt 131 lb (59.4 kg)   SpO2 96%   BMI 25.58 kg/m      05/31/2023   11:59 AM 11/09/2022   11:15 AM  MMSE - Mini Mental State Exam  Orientation to time 5 5  Orientation to Place 5 5  Registration 3 3  Attention/ Calculation 5 5  Recall 3 3  Language- name 2 objects 2 2  Language- repeat 1 1  Language- follow 3 step command 3 3  Language- read & follow direction 1 1  Write a sentence 1 1  Copy design 1 1  Total score 30 30       Objective:   Physical Exam Vitals and nursing note reviewed.  Constitutional:      Appearance: Normal appearance.  Musculoskeletal:        General: Tenderness present. No swelling. Normal range of motion.     Comments: Heberden's node noted to left fifth finger PIP joint. No redness or warmth noted  Skin:    General: Skin is warm and dry.     Findings: No erythema.  Neurological:     General: No focal deficit present.     Mental Status: She is alert and oriented to  person, place, and time.  Psychiatric:        Mood and Affect: Mood normal.        Behavior: Behavior normal.  Thought Content: Thought content normal.        Judgment: Judgment normal.           Assessment & Plan:  1. Primary osteoarthritis of left hand - Reassurance given  - She can try using OTC Voltaren Gel  - Follow up as needed  Shirline Frees, NP

## 2023-10-15 ENCOUNTER — Other Ambulatory Visit: Payer: Self-pay | Admitting: Adult Health

## 2023-10-15 DIAGNOSIS — R110 Nausea: Secondary | ICD-10-CM

## 2023-10-16 ENCOUNTER — Other Ambulatory Visit: Payer: Self-pay | Admitting: Adult Health

## 2023-10-16 DIAGNOSIS — F32A Depression, unspecified: Secondary | ICD-10-CM

## 2023-11-08 ENCOUNTER — Other Ambulatory Visit: Payer: Self-pay | Admitting: Adult Health

## 2023-11-08 DIAGNOSIS — F419 Anxiety disorder, unspecified: Secondary | ICD-10-CM

## 2023-11-13 ENCOUNTER — Other Ambulatory Visit: Payer: Self-pay | Admitting: Adult Health

## 2023-11-13 DIAGNOSIS — M150 Primary generalized (osteo)arthritis: Secondary | ICD-10-CM

## 2023-11-13 DIAGNOSIS — I10 Essential (primary) hypertension: Secondary | ICD-10-CM

## 2023-11-23 ENCOUNTER — Encounter (HOSPITAL_BASED_OUTPATIENT_CLINIC_OR_DEPARTMENT_OTHER): Payer: Self-pay

## 2023-11-23 ENCOUNTER — Emergency Department (HOSPITAL_BASED_OUTPATIENT_CLINIC_OR_DEPARTMENT_OTHER)
Admission: EM | Admit: 2023-11-23 | Discharge: 2023-11-23 | Discharge status: Against Medical Advice | Payer: Medicare Other | Attending: Emergency Medicine | Admitting: Emergency Medicine

## 2023-11-23 ENCOUNTER — Ambulatory Visit: Payer: Self-pay | Admitting: Adult Health

## 2023-11-23 ENCOUNTER — Other Ambulatory Visit: Payer: Self-pay

## 2023-11-23 DIAGNOSIS — Z5321 Procedure and treatment not carried out due to patient leaving prior to being seen by health care provider: Secondary | ICD-10-CM | POA: Diagnosis not present

## 2023-11-23 DIAGNOSIS — R1110 Vomiting, unspecified: Secondary | ICD-10-CM | POA: Insufficient documentation

## 2023-11-23 DIAGNOSIS — R197 Diarrhea, unspecified: Secondary | ICD-10-CM | POA: Insufficient documentation

## 2023-11-23 LAB — CBC
HCT: 40.4 % (ref 36.0–46.0)
Hemoglobin: 13.1 g/dL (ref 12.0–15.0)
MCH: 31.3 pg (ref 26.0–34.0)
MCHC: 32.4 g/dL (ref 30.0–36.0)
MCV: 96.7 fL (ref 80.0–100.0)
Platelets: 133 10*3/uL — ABNORMAL LOW (ref 150–400)
RBC: 4.18 MIL/uL (ref 3.87–5.11)
RDW: 13.8 % (ref 11.5–15.5)
WBC: 8.6 10*3/uL (ref 4.0–10.5)
nRBC: 0 % (ref 0.0–0.2)

## 2023-11-23 LAB — COMPREHENSIVE METABOLIC PANEL
ALT: 30 U/L (ref 0–44)
AST: 42 U/L — ABNORMAL HIGH (ref 15–41)
Albumin: 4 g/dL (ref 3.5–5.0)
Alkaline Phosphatase: 84 U/L (ref 38–126)
Anion gap: 10 (ref 5–15)
BUN: 17 mg/dL (ref 8–23)
CO2: 25 mmol/L (ref 22–32)
Calcium: 9.3 mg/dL (ref 8.9–10.3)
Chloride: 103 mmol/L (ref 98–111)
Creatinine, Ser: 0.78 mg/dL (ref 0.44–1.00)
GFR, Estimated: >60 mL/min (ref >60)
Glucose, Bld: 104 mg/dL — ABNORMAL HIGH (ref 70–99)
Potassium: 3.8 mmol/L (ref 3.5–5.1)
Sodium: 138 mmol/L (ref 135–145)
Total Bilirubin: 0.7 mg/dL (ref 0.0–1.2)
Total Protein: 7 g/dL (ref 6.5–8.1)

## 2023-11-23 LAB — URINALYSIS, ROUTINE W REFLEX MICROSCOPIC
Bilirubin Urine: NEGATIVE
Glucose, UA: NEGATIVE mg/dL
Hgb urine dipstick: NEGATIVE
Ketones, ur: NEGATIVE mg/dL
Leukocytes,Ua: NEGATIVE
Nitrite: NEGATIVE
Protein, ur: 30 mg/dL — AB
Specific Gravity, Urine: >=1.03 (ref 1.005–1.030)
pH: 5.5 (ref 5.0–8.0)

## 2023-11-23 LAB — LIPASE, BLOOD: Lipase: 33 U/L (ref 11–51)

## 2023-11-23 LAB — URINALYSIS, MICROSCOPIC (REFLEX)

## 2023-11-23 MED ORDER — ONDANSETRON 4 MG PO TBDP
4.0000 mg | ORAL_TABLET | Freq: Once | ORAL | Status: AC | PRN
  Administered 2023-11-23: 4 mg via ORAL
  Filled 2023-11-23: qty 1

## 2023-11-23 NOTE — Telephone Encounter (Signed)
 Copied from CRM [id number]. Topic: Clinical - Pink Word Triage >> Nov 23, 2023  9:32 AM Curlee DEL wrote: Reason for Triage: Patient reports that she has been vomiting and  experiencing nausea - she has not been able to keep anything down.   Chief Complaint: Nausea/Vomiting Symptoms: Nausea, vomiting, slight headache, a little bit of diarrhea Frequency: Ongoing since Wednesday Pertinent Negatives: Patient denies dizziness Disposition: [x] ED /[] Urgent Care (no appt availability in office) / [] Appointment(In office/virtual)/ []  [facility]/ [] Home Care/ [] Refused Recommended Disposition /[] [facility]/ []  Follow-up with PCP  Additional Notes: Patient stated she has been experiencing nausea and vomiting since Wednesday. She has been unable to keep anything down including fluids. She has also had a little diarrhea. Patient asked to be seen in the office. I advised patient that it is recommended to go to the ED due to concerns with dehydration. Patient verbalized understanding and stated her son can take her.   Reason for Disposition  [1] MODERATE vomiting (e.g., 3 - 5 times/day) AND [2] age > 60 years  Answer Assessment - Initial Assessment Questions 1. VOMITING SEVERITY: How many times have you vomited in the past 24 hours?     - MILD:  1 - 2 times/day    - MODERATE: 3 - 5 times/day, decreased oral intake without significant weight loss or symptoms of dehydration    - SEVERE: 6 or more times/day, vomits everything or nearly everything, with significant weight loss, symptoms of dehydration      4 or 5 2. ONSET: When did the vomiting begin?      Wednesday   3. FLUIDS: What fluids or food have you vomited up today? Have you been able to keep any fluids down?     Yes  4. ABDOMEN PAIN: Are your having any abdomen pain? If Yes : How bad is it and what does it feel like? (e.g., crampy, dull, intermittent, constant)      No  5. DIARRHEA: Is there any diarrhea? If  Yes, ask: How many times today?      Little bit  6. CAUSE: What do you think is causing your vomiting?     Unknown   7. OTHER SYMPTOMS: Do you have any other symptoms? (e.g., fever, headache, vertigo, vomiting blood or coffee grounds, recent head injury)     Slight headache  Protocols used: Vomiting-A-AH

## 2023-11-23 NOTE — ED Triage Notes (Signed)
 The patient having vomiting and diarrhea since yesterday. She has a similar episode three weeks ago.

## 2023-11-26 ENCOUNTER — Ambulatory Visit (INDEPENDENT_AMBULATORY_CARE_PROVIDER_SITE_OTHER): Payer: Medicare Other | Admitting: Family Medicine

## 2023-11-26 ENCOUNTER — Encounter: Payer: Self-pay | Admitting: Family Medicine

## 2023-11-26 VITALS — BP 120/60 | HR 68 | Temp 97.4°F | Wt 123.8 lb

## 2023-11-26 DIAGNOSIS — R110 Nausea: Secondary | ICD-10-CM | POA: Diagnosis not present

## 2023-11-26 DIAGNOSIS — A084 Viral intestinal infection, unspecified: Secondary | ICD-10-CM | POA: Diagnosis not present

## 2023-11-26 MED ORDER — ONDANSETRON HCL 4 MG PO TABS: 4.0000 mg | ORAL_TABLET | Freq: Four times a day (QID) | ORAL | 0 refills | Status: DC | PRN

## 2023-11-26 NOTE — Progress Notes (Signed)
   Subjective:    Patient ID: Sheila Wolf, female    DOB: 09-Nov-1934, 89 y.o.   MRN: [id number]  HPI Here with her son for 6 days of nausea and vomiting. This started the day after she visited a friend who lives in a local extended care facility, and she spent time holding her hands. There has been no diarrhea or fever. No cramps or pain. She went to the ED on 11-23-23 and had labs drawn, but she left after 4 hours without being seen. Since then she has been drinking Gatorade and water, and she has been taking Zofran  as needed. Today she feels much better and is only slightly nauseated. We reviewed her lab results from the ED and these were all normal including a WBC of 8.6.    Review of Systems  Constitutional:  Positive for fatigue. Negative for diaphoresis and fever.  HENT: Negative.    Respiratory: Negative.    Cardiovascular: Negative.   Gastrointestinal:  Positive for nausea and vomiting. Negative for abdominal distention, abdominal pain, anal bleeding, blood in stool, constipation and diarrhea.  Genitourinary: Negative.        Objective:   Physical Exam Constitutional:      Appearance: Normal appearance. She is not ill-appearing.  Cardiovascular:     Rate and Rhythm: Normal rate and regular rhythm.     Pulses: Normal pulses.     Heart sounds: Normal heart sounds.  Pulmonary:     Effort: Pulmonary effort is normal.     Breath sounds: Normal breath sounds.  Abdominal:     General: Abdomen is flat. Bowel sounds are normal. There is no distension.     Palpations: Abdomen is soft. There is no mass.     Tenderness: There is no abdominal tenderness. There is no guarding or rebound.     Hernia: No hernia is present.  Neurological:     Mental Status: She is alert.           Assessment & Plan:  She is recovering from a viral enteritis, possibly Norovirus. She seems to be at the end of it today. We will refill Zofran  to use as needed. She will return as needed.   Garnette Olmsted, MD

## 2023-11-28 ENCOUNTER — Ambulatory Visit: Payer: Medicare Other | Admitting: Adult Health

## 2023-11-28 VITALS — BP 138/80 | HR 73 | Temp 97.5°F | Ht 61.5 in | Wt 124.0 lb

## 2023-11-28 DIAGNOSIS — R4189 Other symptoms and signs involving cognitive functions and awareness: Secondary | ICD-10-CM | POA: Diagnosis not present

## 2023-11-28 DIAGNOSIS — F419 Anxiety disorder, unspecified: Secondary | ICD-10-CM

## 2023-11-28 DIAGNOSIS — E782 Mixed hyperlipidemia: Secondary | ICD-10-CM

## 2023-11-28 DIAGNOSIS — M150 Primary generalized (osteo)arthritis: Secondary | ICD-10-CM | POA: Diagnosis not present

## 2023-11-28 DIAGNOSIS — R519 Headache, unspecified: Secondary | ICD-10-CM | POA: Diagnosis not present

## 2023-11-28 DIAGNOSIS — F32A Depression, unspecified: Secondary | ICD-10-CM

## 2023-11-28 DIAGNOSIS — I10 Essential (primary) hypertension: Secondary | ICD-10-CM

## 2023-11-28 NOTE — Progress Notes (Signed)
 Subjective:    Patient ID: Sheila Wolf, female    DOB: [birth year], 89 y.o.   MRN: [id number]  HPI Patient presents for yearly preventative medicine examination. She is a pleasant 89 year old female who  has a past medical history of Anxiety and depression, Arthritis, Frequent headaches, GERD (gastroesophageal reflux disease), Hypertension, Osteoporosis, Thrombocytopathia (HCC), and Urinary incontinence.  Hypertension - managed with Norvasc  5 mg  and metoprolol  50 mg daily.  She denies dizziness, lightheadedness, chest pain, or shortness of breath BP Readings from Last 3 Encounters:  11/28/23 138/80  11/26/23 120/60  11/23/23 (!) 163/79   Anxiety and depression-managed with Lexapro  20 mg daily.  She does feel well-controlled on this medication  Osteoarthritis - Takes Mobic  15 mg daily.   Headaches-uses Fioricet as needed.  She has had headaches all of her life.  There have been no changes in the quantity or quality of her headaches.  Hyperlipidemia - managed with Lipitor 20 mg daily. . She denies myalgia or fatigue  Lab Results  Component Value Date   CHOL 217 (H) 11/10/2022   HDL 81.10 11/10/2022   LDLCALC 117 (H) 11/10/2022   TRIG 94.0 11/10/2022   CHOLHDL 3 11/10/2022   Cognitive Impairment - has been mild in the past but over the year she feels as though her memory has gotten worse.  Her daughter is with her today who verifies this.  Got lost earlier today on the way to her appointment and ended up on the other side of town.     05/31/2023   11:59 AM 11/09/2022   11:15 AM  MMSE - Mini Mental State Exam  Orientation to time 5 5  Orientation to Place 5 5  Registration 3 3  Attention/ Calculation 5 5  Recall 3 3  Language- name 2 objects 2 2  Language- repeat 1 1  Language- follow 3 step command 3 3  Language- read & follow direction 1 1  Write a sentence 1 1  Copy design 1 1  Total score 30 30     All immunizations and health maintenance protocols  were reviewed with the patient and needed orders were placed.  Appropriate screening laboratory values were ordered for the patient including screening of hyperlipidemia, renal function and hepatic function.   Medication reconciliation,  past medical history, social history, problem list and allergies were reviewed in detail with the patient  Goals were established with regard to weight loss, exercise, and  diet in compliance with medications  She has no acute issues.   Review of Systems  Constitutional: Negative.   HENT: Negative.    Eyes: Negative.   Respiratory: Negative.    Cardiovascular: Negative.   Gastrointestinal: Negative.   Endocrine: Negative.   Genitourinary: Negative.   Musculoskeletal:  Positive for arthralgias.  Skin: Negative.   Allergic/Immunologic: Negative.   Neurological: Negative.   Hematological: Negative.   Psychiatric/Behavioral:  Positive for confusion.    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HCC)    Urinary incontinence     Social History   Socioeconomic History   Marital status: Widowed    Spouse name: Not on file   Number of children: Not on file   Years of education: Not on file   Highest education level: Not on file  Occupational History   Not on file  Tobacco Use   Smoking status: Former    Types: Cigarettes   Smokeless tobacco: Never  Vaping Use   Vaping status: Never Used  Substance and Sexual Activity   Alcohol use: Yes    Alcohol/week: 10.0 standard drinks of alcohol    Types: 10 Glasses of wine per week   Drug use: No   Sexual activity: Not Currently  Other Topics Concern   Not on file  Social History Narrative   Not on file   Social Drivers of Health   Financial Resource Strain: Low Risk  (07/12/2023)   Overall Financial Resource Strain (CARDIA)     Difficulty of Paying Living Expenses: Not hard at all  Food Insecurity: No Food Insecurity (07/12/2023)   Hunger Vital Sign    Worried About Running Out of Food in the Last Year: Never true    Ran Out of Food in the Last Year: Never true  Transportation Needs: No Transportation Needs (07/12/2023)   PRAPARE - Administrator, Civil Service (Medical): No    Lack of Transportation (Non-Medical): No  Physical Activity: Inactive (07/12/2023)   Exercise Vital Sign    Days of Exercise per Week: 0 days    Minutes of Exercise per Session: 0 min  Stress: No Stress Concern Present (07/12/2023)   [employer] of Occupational Health - Occupational Stress Questionnaire    Feeling of Stress : Not at all  Social Connections: Moderately Isolated (07/12/2023)   Social Connection and Isolation Panel [NHANES]    Frequency of Communication with Friends and Family: More than three times a week    Frequency of Social Gatherings with Friends and Family: Once a week    Attends Religious Services: More than 4 times per year    Active Member of [employer] or Organizations: No    Attends Banker Meetings: Never    Marital Status: Widowed  Intimate Partner Violence: Not At Risk (07/12/2023)   Humiliation, Afraid, Rape, and Kick questionnaire    Fear of Current or Ex-Partner: No    Emotionally Abused: No    Physically Abused: No    Sexually Abused: No    Past Surgical History:  Procedure Laterality Date   APPENDECTOMY     BUNIONECTOMY     CERVICAL SPINE SURGERY     SHOULDER SURGERY     TONSILLECTOMY AND ADENOIDECTOMY      Family History  Problem Relation Age of Onset   Arthritis Mother    Hearing loss Mother    Heart disease Mother    Hypertension Mother    Alcohol abuse Father    Early death Father    Heart disease Father    Stroke Father    Birth defects Brother    Heart disease Brother     Allergies  Allergen Reactions   Penicillins Rash    Current Outpatient  Medications on File Prior to Visit  Medication Sig Dispense Refill   amLODipine  (NORVASC ) 5 MG tablet Take 1 tablet (5 mg total) by mouth daily. 90 tablet 0   atorvastatin  (LIPITOR) 20 MG tablet Take 1 tablet (20 mg total) by mouth daily. 90 tablet 3   butalbital -acetaminophen-caffeine  (FIORICET) 50-325-40 MG tablet TAKE 1 TABLET BY MOUTH 4 TIMES DAILY AS NEEDED FOR HEADACHE 60 tablet 2   cetirizine  (ZYRTEC ) 10 MG tablet TAKE 1 TABLET BY MOUTH EVERY DAY 90 tablet 3   cycloSPORINE (RESTASIS) 0.05 % ophthalmic emulsion 1 drop  2 (two) times daily.     escitalopram  (LEXAPRO ) 20 MG tablet TAKE 1 TABLET BY MOUTH ONCE DAILY 30 tablet 0   Melatonin 5 MG CAPS Take by mouth.     meloxicam  (MOBIC ) 15 MG tablet TAKE 1 TABLET BY MOUTH EVERY DAY 90 tablet 3   metoprolol  succinate (TOPROL -XL) 50 MG 24 hr tablet TAKE 1 TABLET BY MOUTH EVERY DAY 90 tablet 3   nystatin -triamcinolone  ointment (MYCOLOG) apply topically 2 TIMES DAILY 30 g 1   ondansetron  (ZOFRAN ) 4 MG tablet Take 1 tablet (4 mg total) by mouth every 6 (six) hours as needed for nausea or vomiting. 30 tablet 0   No current facility-administered medications on file prior to visit.    BP 138/80   Pulse 73   Temp (!) 97.5 F (36.4 C) (Oral)   Ht 5' 1.5 (1.562 m)   Wt 124 lb (56.2 kg)   SpO2 96%   BMI 23.05 kg/m       Objective:   Physical Exam Vitals and nursing note reviewed.  Constitutional:      General: She is not in acute distress.    Appearance: Normal appearance. She is not ill-appearing.  HENT:     Head: Normocephalic and atraumatic.     Right Ear: Tympanic membrane, ear canal and external ear normal. There is no impacted cerumen.     Left Ear: Tympanic membrane, ear canal and external ear normal. There is no impacted cerumen.     Nose: Nose normal. No congestion or rhinorrhea.     Mouth/Throat:     Mouth: Mucous membranes are moist.     Pharynx: Oropharynx is clear.  Eyes:     Extraocular Movements: Extraocular movements  intact.     Conjunctiva/sclera: Conjunctivae normal.     Pupils: Pupils are equal, round, and reactive to light.  Neck:     Vascular: No carotid bruit.  Cardiovascular:     Rate and Rhythm: Normal rate and regular rhythm.     Pulses: Normal pulses.     Heart sounds: No murmur heard.    No friction rub. No gallop.  Pulmonary:     Effort: Pulmonary effort is normal.     Breath sounds: Normal breath sounds.  Abdominal:     General: Abdomen is flat. Bowel sounds are normal. There is no distension.     Palpations: Abdomen is soft. There is no mass.     Tenderness: There is no abdominal tenderness. There is no guarding or rebound.     Hernia: No hernia is present.  Musculoskeletal:        General: Normal range of motion.     Cervical back: Normal range of motion and neck supple.  Lymphadenopathy:     Cervical: No cervical adenopathy.  Skin:    General: Skin is warm and dry.     Capillary Refill: Capillary refill takes less than 2 seconds.  Neurological:     General: No focal deficit present.     Mental Status: She is alert and oriented to person, place, and time.  Psychiatric:        Mood and Affect: Mood normal.        Speech: Speech normal.        Behavior: Behavior normal.        Thought Content: Thought content normal.        Cognition and Memory: Memory is impaired.        Judgment: Judgment normal.  Assessment & Plan:  1. Primary hypertension (Primary) - Controlled. No change in medication  - CBC with Differential/Platelet; Future - Comprehensive metabolic panel; Future - Lipid panel; Future - TSH; Future  2. Anxiety and depression Continue with SSRI  - CBC with Differential/Platelet; Future - Comprehensive metabolic panel; Future - Lipid panel; Future - TSH; Future  3. Primary osteoarthritis involving multiple joints - Continue with Mobic   - CBC with Differential/Platelet; Future - Comprehensive metabolic panel; Future - Lipid panel; Future - TSH;  Future  4. Frequent headaches - Continue with Fioricet  - CBC with Differential/Platelet; Future - Comprehensive metabolic panel; Future - Lipid panel; Future - TSH; Future  5. Mixed hyperlipidemia - consider increase in statin  - CBC with Differential/Platelet; Future - Comprehensive metabolic panel; Future - Lipid panel; Future - TSH; Future  6. Cognitive impairment -Recent MMSE's have been perfect, we did not have a chance to repeat her MMSE today due to being late.  Will order MRI of the brain. - Consider referral to neurology  - CBC with Differential/Platelet; Future - Comprehensive metabolic panel; Future - Lipid panel; Future - TSH; Future - MR Brain Wo Contrast; Future  Darleene Shape, NP

## 2023-11-28 NOTE — Patient Instructions (Signed)
 It was great seeing you today   We will follow up with you regarding your lab work   Please let me know if you need anything   Please schedule your blood work

## 2023-11-29 ENCOUNTER — Other Ambulatory Visit: Payer: Self-pay

## 2023-11-29 ENCOUNTER — Other Ambulatory Visit: Payer: Medicare Other

## 2023-11-29 ENCOUNTER — Other Ambulatory Visit (INDEPENDENT_AMBULATORY_CARE_PROVIDER_SITE_OTHER): Payer: Medicare Other

## 2023-11-29 DIAGNOSIS — F419 Anxiety disorder, unspecified: Secondary | ICD-10-CM

## 2023-11-29 DIAGNOSIS — I10 Essential (primary) hypertension: Secondary | ICD-10-CM | POA: Diagnosis not present

## 2023-11-29 DIAGNOSIS — R4189 Other symptoms and signs involving cognitive functions and awareness: Secondary | ICD-10-CM

## 2023-11-29 DIAGNOSIS — M150 Primary generalized (osteo)arthritis: Secondary | ICD-10-CM

## 2023-11-29 DIAGNOSIS — E782 Mixed hyperlipidemia: Secondary | ICD-10-CM

## 2023-11-29 DIAGNOSIS — R519 Headache, unspecified: Secondary | ICD-10-CM | POA: Diagnosis not present

## 2023-11-29 DIAGNOSIS — F32A Depression, unspecified: Secondary | ICD-10-CM | POA: Diagnosis not present

## 2023-11-29 LAB — COMPREHENSIVE METABOLIC PANEL
ALT: 36 U/L — ABNORMAL HIGH (ref 0–35)
AST: 50 U/L — ABNORMAL HIGH (ref 0–37)
Albumin: 4.3 g/dL (ref 3.5–5.2)
Alkaline Phosphatase: 85 U/L (ref 39–117)
BUN: 16 mg/dL (ref 6–23)
CO2: 30 meq/L (ref 19–32)
Calcium: 9.3 mg/dL (ref 8.4–10.5)
Chloride: 103 meq/L (ref 96–112)
Creatinine, Ser: 0.83 mg/dL (ref 0.40–1.20)
GFR: 63.35 mL/min (ref >60.00)
Glucose, Bld: 108 mg/dL — ABNORMAL HIGH (ref 70–99)
Potassium: 3.5 meq/L (ref 3.5–5.1)
Sodium: 142 meq/L (ref 135–145)
Total Bilirubin: 0.5 mg/dL (ref 0.2–1.2)
Total Protein: 7.1 g/dL (ref 6.0–8.3)

## 2023-11-29 LAB — CBC WITH DIFFERENTIAL/PLATELET
Basophils Absolute: 0.1 10*3/uL (ref 0.0–0.1)
Basophils Relative: 1.3 % (ref 0.0–3.0)
Eosinophils Absolute: 0.2 10*3/uL (ref 0.0–0.7)
Eosinophils Relative: 4.9 % (ref 0.0–5.0)
HCT: 40.6 % (ref 36.0–46.0)
Hemoglobin: 13.4 g/dL (ref 12.0–15.0)
Lymphocytes Relative: 37.8 % (ref 12.0–46.0)
Lymphs Abs: 1.8 10*3/uL (ref 0.7–4.0)
MCHC: 33.1 g/dL (ref 30.0–36.0)
MCV: 96.4 fL (ref 78.0–100.0)
Monocytes Absolute: 0.6 10*3/uL (ref 0.1–1.0)
Monocytes Relative: 12.9 % — ABNORMAL HIGH (ref 3.0–12.0)
Neutro Abs: 2.1 10*3/uL (ref 1.4–7.7)
Neutrophils Relative %: 43.1 % (ref 43.0–77.0)
Platelets: 125 10*3/uL — ABNORMAL LOW (ref 150.0–400.0)
RBC: 4.21 Mil/uL (ref 3.87–5.11)
RDW: 14 % (ref 11.5–15.5)
WBC: 4.9 10*3/uL (ref 4.0–10.5)

## 2023-11-29 LAB — LIPID PANEL
Cholesterol: 213 mg/dL — ABNORMAL HIGH (ref 0–200)
HDL: 91.4 mg/dL (ref >39.00)
LDL Cholesterol: 104 mg/dL — ABNORMAL HIGH (ref 0–99)
NonHDL: 122.01
Total CHOL/HDL Ratio: 2
Triglycerides: 89 mg/dL (ref 0.0–149.0)
VLDL: 17.8 mg/dL (ref 0.0–40.0)

## 2023-11-29 LAB — TSH: TSH: 1.26 u[IU]/mL (ref 0.35–5.50)

## 2023-11-29 MED ORDER — ATORVASTATIN CALCIUM 40 MG PO TABS: 40.0000 mg | ORAL_TABLET | Freq: Every day | ORAL | 0 refills | Status: DC

## 2023-11-30 ENCOUNTER — Other Ambulatory Visit: Payer: Self-pay

## 2023-11-30 MED ORDER — ATORVASTATIN CALCIUM 40 MG PO TABS: 40.0000 mg | ORAL_TABLET | Freq: Every day | ORAL | 1 refills | Status: DC

## 2023-12-07 ENCOUNTER — Encounter: Payer: Self-pay | Admitting: Adult Health

## 2023-12-18 ENCOUNTER — Other Ambulatory Visit: Payer: Self-pay | Admitting: Adult Health

## 2023-12-18 DIAGNOSIS — F419 Anxiety disorder, unspecified: Secondary | ICD-10-CM

## 2023-12-19 ENCOUNTER — Other Ambulatory Visit: Payer: Medicare Other

## 2023-12-29 ENCOUNTER — Other Ambulatory Visit: Payer: Self-pay | Admitting: Adult Health

## 2023-12-29 DIAGNOSIS — R519 Headache, unspecified: Secondary | ICD-10-CM

## 2024-01-02 NOTE — Telephone Encounter (Signed)
Okay for refill?

## 2024-01-16 ENCOUNTER — Other Ambulatory Visit: Payer: Self-pay | Admitting: Adult Health

## 2024-01-16 DIAGNOSIS — F32A Depression, unspecified: Secondary | ICD-10-CM

## 2024-01-24 DIAGNOSIS — H04123 Dry eye syndrome of bilateral lacrimal glands: Secondary | ICD-10-CM | POA: Diagnosis not present

## 2024-01-24 DIAGNOSIS — Z961 Presence of intraocular lens: Secondary | ICD-10-CM | POA: Diagnosis not present

## 2024-02-21 ENCOUNTER — Ambulatory Visit (INDEPENDENT_AMBULATORY_CARE_PROVIDER_SITE_OTHER): Payer: Medicare Other | Admitting: Adult Health

## 2024-02-21 ENCOUNTER — Encounter: Payer: Self-pay | Admitting: Adult Health

## 2024-02-21 VITALS — BP 130/80 | HR 74 | Temp 98.0°F | Wt 131.0 lb

## 2024-02-21 DIAGNOSIS — E782 Mixed hyperlipidemia: Secondary | ICD-10-CM | POA: Diagnosis not present

## 2024-02-21 DIAGNOSIS — R748 Abnormal levels of other serum enzymes: Secondary | ICD-10-CM | POA: Diagnosis not present

## 2024-02-21 LAB — LIPID PANEL
Cholesterol: 166 mg/dL (ref 0–200)
HDL: 100 mg/dL (ref >39.00)
LDL Cholesterol: 55 mg/dL (ref 0–99)
NonHDL: 66.26
Total CHOL/HDL Ratio: 2
Triglycerides: 54 mg/dL (ref 0.0–149.0)
VLDL: 10.8 mg/dL (ref 0.0–40.0)

## 2024-02-21 LAB — HEPATIC FUNCTION PANEL
ALT: 27 U/L (ref 0–35)
AST: 40 U/L — ABNORMAL HIGH (ref 0–37)
Albumin: 4.1 g/dL (ref 3.5–5.2)
Alkaline Phosphatase: 85 U/L (ref 39–117)
Bilirubin, Direct: 0.1 mg/dL (ref 0.0–0.3)
Total Bilirubin: 0.4 mg/dL (ref 0.2–1.2)
Total Protein: 6.9 g/dL (ref 6.0–8.3)

## 2024-02-21 NOTE — Patient Instructions (Signed)
 It was great seeing you today   We will follow up with you regarding your lab work   Please let me know if you need anything

## 2024-02-21 NOTE — Progress Notes (Signed)
 Subjective:    Patient ID: Sheila Wolf, female    DOB: Nov 03, 1934, 89 y.o.   MRN: [id number]  HPI 89 year old female who  has a past medical history of Anxiety and depression, Arthritis, Frequent headaches, GERD (gastroesophageal reflux disease), Hypertension, Osteoporosis, Thrombocytopathia (HCC), and Urinary incontinence.  She presents to the office today for three month follow up regarding hyperlipidemia. During her CPE three months ago her lipid panel was not at goal despite taking Lipitor 20 mg. Her dose was increased to 40 mg. Since starting the 40 mg dose she has not had any side effects such as myalgia or fatigue.   She was also noted to have slightly elevated AST ( 50) and ALT ( 36).    Review of Systems See HPI   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HCC)    Urinary incontinence     Social History   Socioeconomic History   Marital status: Widowed    Spouse name: Not on file   Number of children: Not on file   Years of education: Not on file   Highest education level: Not on file  Occupational History   Not on file  Tobacco Use   Smoking status: Former    Types: Cigarettes   Smokeless tobacco: Never  Vaping Use   Vaping status: Never Used  Substance and Sexual Activity   Alcohol use: Yes    Alcohol/week: 10.0 standard drinks of alcohol    Types: 10 Glasses of wine per week   Drug use: No   Sexual activity: Not Currently  Other Topics Concern   Not on file  Social History Narrative   Not on file   Social Drivers of Health   Financial Resource Strain: Low Risk  (07/12/2023)   Overall Financial Resource Strain (CARDIA)    Difficulty of Paying Living Expenses: Not hard at all  Food Insecurity: No Food Insecurity (07/12/2023)   Hunger Vital Sign    Worried About Running Out of Food in  the Last Year: Never true    Ran Out of Food in the Last Year: Never true  Transportation Needs: No Transportation Needs (07/12/2023)   PRAPARE - Administrator, Civil Service (Medical): No    Lack of Transportation (Non-Medical): No  Physical Activity: Inactive (07/12/2023)   Exercise Vital Sign    Days of Exercise per Week: 0 days    Minutes of Exercise per Session: 0 min  Stress: No Stress Concern Present (07/12/2023)   [employer] of Occupational Health - Occupational Stress Questionnaire    Feeling of Stress : Not at all  Social Connections: Moderately Isolated (07/12/2023)   Social Connection and Isolation Panel [NHANES]    Frequency of Communication with Friends and Family: More than three times a week    Frequency of Social Gatherings with Friends and Family: Once a week    Attends Religious Services: More than 4 times per year    Active Member of [employer] or Organizations: No    Attends Banker Meetings: Never    Marital Status: Widowed  Intimate Partner Violence: Not At Risk (07/12/2023)   Humiliation, Afraid, Rape, and Kick questionnaire    Fear of Current or Ex-Partner: No    Emotionally Abused: No    Physically Abused:  No    Sexually Abused: No    Past Surgical History:  Procedure Laterality Date   APPENDECTOMY     BUNIONECTOMY     CERVICAL SPINE SURGERY     SHOULDER SURGERY     TONSILLECTOMY AND ADENOIDECTOMY      Family History  Problem Relation Age of Onset   Arthritis Mother    Hearing loss Mother    Heart disease Mother    Hypertension Mother    Alcohol abuse Father    Early death Father    Heart disease Father    Stroke Father    Birth defects Brother    Heart disease Brother     Allergies  Allergen Reactions   Penicillins Rash    Current Outpatient Medications on File Prior to Visit  Medication Sig Dispense Refill   amLODipine (NORVASC) 5 MG tablet Take 1 tablet (5 mg total) by mouth daily. 90 tablet 0    atorvastatin (LIPITOR) 40 MG tablet Take 1 tablet (40 mg total) by mouth daily. 90 tablet 1   butalbital-acetaminophen-caffeine (FIORICET) 50-325-40 MG tablet TAKE 1 TABLET BY MOUTH 4 TIMES DAILY AS NEEDED FOR HEADACHE 60 tablet 2   cetirizine (ZYRTEC) 10 MG tablet TAKE 1 TABLET BY MOUTH EVERY DAY 90 tablet 3   cycloSPORINE (RESTASIS) 0.05 % ophthalmic emulsion 1 drop 2 (two) times daily.     escitalopram (LEXAPRO) 20 MG tablet TAKE 1 TABLET BY MOUTH ONCE DAILY 30 tablet 0   Melatonin 5 MG CAPS Take by mouth.     meloxicam (MOBIC) 15 MG tablet TAKE 1 TABLET BY MOUTH EVERY DAY 90 tablet 3   metoprolol succinate (TOPROL-XL) 50 MG 24 hr tablet TAKE 1 TABLET BY MOUTH EVERY DAY 90 tablet 3   No current facility-administered medications on file prior to visit.    BP 130/80   Pulse 74   Temp 98 F (36.7 C) (Oral)   Wt 131 lb (59.4 kg)   SpO2 97%   BMI 24.35 kg/m       Objective:   Physical Exam Vitals and nursing note reviewed.  Constitutional:      Appearance: Normal appearance.  Cardiovascular:     Rate and Rhythm: Normal rate and regular rhythm.     Pulses: Normal pulses.     Heart sounds: Normal heart sounds.  Pulmonary:     Effort: Pulmonary effort is normal.     Breath sounds: Normal breath sounds.  Musculoskeletal:        General: Normal range of motion.  Skin:    General: Skin is warm and dry.     Capillary Refill: Capillary refill takes less than 2 seconds.  Neurological:     General: No focal deficit present.     Mental Status: She is alert.  Psychiatric:        Mood and Affect: Mood normal.        Behavior: Behavior normal.        Thought Content: Thought content normal.        Judgment: Judgment normal.       Assessment & Plan:  1. Mixed hyperlipidemia (Primary) - Consider further increase in statin  - Lipid panel; Future  2. Elevated liver enzymes  - Hepatic function panel; Future  Shirline Frees, NP

## 2024-02-28 ENCOUNTER — Ambulatory Visit: Payer: Self-pay

## 2024-02-28 ENCOUNTER — Ambulatory Visit (INDEPENDENT_AMBULATORY_CARE_PROVIDER_SITE_OTHER): Admitting: Family Medicine

## 2024-02-28 ENCOUNTER — Encounter: Payer: Self-pay | Admitting: Family Medicine

## 2024-02-28 VITALS — BP 136/72 | HR 70 | Temp 98.3°F | Wt 130.0 lb

## 2024-02-28 DIAGNOSIS — R110 Nausea: Secondary | ICD-10-CM

## 2024-02-28 MED ORDER — ONDANSETRON 4 MG PO TBDP: 4.0000 mg | ORAL_TABLET | Freq: Three times a day (TID) | ORAL | 0 refills | Status: DC | PRN

## 2024-02-28 NOTE — Patient Instructions (Addendum)
-  Ordered labs. Office will call with lab results.  -Prescribed Zofran 4mg  ODT, place 1 tablet underneath tongue every 8 hours as needed for nausea and vomiting.  -Provided information about nausea and foods that you can start to introduce after having nausea and vomiting.  -Follow up if not improved or symptoms become worse.

## 2024-02-28 NOTE — Telephone Encounter (Signed)
 This encounter was created in error - please disregard.  Copied from CRM [id number]. Topic: Clinical - Medical Advice >> Feb 28, 2024  2:51 PM Martinique E wrote: Reason for CRM: Patient's daughter, Taniqua, called in requesting medical advice. Stated how patient was throwing up last night and today she has just been gagging, daughter said this has been a recurring issue and asking if her mother should just stop taking her nausea pills and drink clear liquids the rest of the day to see if that helps. Callback number for Ellene is [phone].

## 2024-02-28 NOTE — Telephone Encounter (Signed)
 Noted.

## 2024-02-28 NOTE — Telephone Encounter (Signed)
 Spoke to patient's daughter, Hena, on the phone. Daughter is asking if she should still bring her mother in for her scheduled appointment this afternoon. Daughter stated that symptoms have not changed since being triaged earlier today. Advised daughter to follow recommended disposition and bring patient in for scheduled appointment.   Copied from CRM [id number]. Topic: Clinical - Red Word Triage >> Feb 28, 2024  3:12 PM Florestine Avers wrote: Red Word that prompted transfer to Nurse Triage: Patient's daughter, Breanah, called in requesting medical advice. Stated how patient was throwing up last night and today she has just been gagging, daughter said this has been a recurring issue and asking if her mother should just stop taking her nausea pills and drink clear liquids the rest of the day to see if that helps. Callback number for [place] is [phone]. Reason for Disposition  Health Information question, no triage required and triager able to answer question  Protocols used: Information Only Call - No Triage-A-AH

## 2024-02-28 NOTE — Progress Notes (Signed)
   Acute Office Visit   Subjective:  Patient ID: Sheila Wolf, female    DOB: 24-Feb-1934, 89 y.o.   MRN: [id number]  Chief Complaint  Patient presents with   Emesis    HPI:  Patient reports she went out to eat at [facility] last night. When she got home, she started having nausea and vomiting. She reports she has vomited twice today, this morning and this evening.   Denies abd pain, fever, constipation, diarrhea, pain, or urinary symptoms.   Patient has been taking Zofran 4mg  every 6 hours as needed for nausea and vomiting. Patient reports she took one tablet last night and one tablet today.   Review of Systems  Gastrointestinal:  Positive for vomiting.  See HPI above      Objective:   BP 136/72   Pulse 70   Temp 98.3 F (36.8 C) (Oral)   Wt 130 lb (59 kg)   SpO2 98%   BMI 24.17 kg/m    Physical Exam Vitals reviewed.  Constitutional:      General: She is not in acute distress.    Appearance: Normal appearance. She is not ill-appearing, toxic-appearing or diaphoretic.  HENT:     Head: Normocephalic and atraumatic.  Eyes:     General:        Right eye: No discharge.        Left eye: No discharge.     Conjunctiva/sclera: Conjunctivae normal.  Cardiovascular:     Rate and Rhythm: Normal rate and regular rhythm.     Heart sounds: Normal heart sounds. No murmur heard.    No friction rub. No gallop.  Pulmonary:     Effort: Pulmonary effort is normal. No respiratory distress.     Breath sounds: Normal breath sounds.  Abdominal:     General: Bowel sounds are normal. There is no distension.     Palpations: There is no mass.     Tenderness: There is no abdominal tenderness.  Musculoskeletal:        General: Normal range of motion.  Skin:    General: Skin is warm and dry.  Neurological:     General: No focal deficit present.     Mental Status: She is alert and oriented to person, place, and time. Mental status is at baseline.  Psychiatric:         Mood and Affect: Mood normal.        Behavior: Behavior normal.        Thought Content: Thought content normal.        Judgment: Judgment normal.       Assessment & Plan:  Nausea -     Ondansetron; Take 1 tablet (4 mg total) by mouth every 8 (eight) hours as needed for nausea or vomiting.  Dispense: 20 tablet; Refill: 0 -     CBC with Differential/Platelet -     Comprehensive metabolic panel with GFR  -Ordered labs. Office will call with lab results.  -Prescribed Zofran 4mg  ODT, place 1 tablet underneath tongue every 8 hours as needed for nausea and vomiting.  -Provided information about nausea and foods that she can start to introduce after having nausea and vomiting.  -Follow up if not improved or symptoms become worse.   Zandra Abts, NP

## 2024-02-28 NOTE — Telephone Encounter (Signed)
 Chief Complaint: nausea, vomiting Symptoms: nausea and vomiting  Frequency: since last PM Pertinent Negatives: Patient denies abd pain, fever, chills, diarrhea, bloody stools, hematemesis, blurry vision, head injury Disposition: [] ED /[] Urgent Care (no appt availability in office) / [x] Appointment(In office/virtual)/ []  [place] [facility]/ [] Home Care/ [] Refused Recommended Disposition /[] [place] [facility]/ []  Follow-up with PCP Additional Notes: Pt reports nausea and vomiting since returning home last night after going out to dinner. Pt states after returning home she vomited up her dinner. Then, pt took a pill for nausea. Pt states she awoke once during the night to vomit. Pt states she "tried" to vomit 3x this AM but there is only phlegm. Pt denies abdominal pain and diarrhea. Pt states she tried to eat toast this AM but did not have an appetite. Pt states she has drank and kept down some water. Pt states she feels a little "woozy." RN scheduled pt for 1600 today in the office. Pt states her daughters live nearby and she will have one of them bring her to the appt. RN advised pt if she worsens before then and develops diarrhea, abdominal pain, recurring vomiting, fever, visual changes, etc, she needs to go to the ED. Pt states she lives at [facility] with a nurse on site and also verbalized understanding.   Copied From CRM [id number]. Reason for Triage: Patient has been vomiting since last night after dinner and is very nauseous, requested an appointment. Please call either home or cell on file.   Reason for Disposition  [1] MILD or MODERATE vomiting AND [2] present > 48 hours (2 days) (Exception: Mild vomiting with associated diarrhea.)  Answer Assessment - Initial Assessment Questions 1. VOMITING SEVERITY: "How many times have you vomited in the past 24 hours?"     - MILD:  1 - 2 times/day    - MODERATE: 3 - 5 times/day, decreased oral intake without significant weight loss or  symptoms of dehydration    - SEVERE: 6 or more times/day, vomits everything or nearly everything, with significant weight loss, symptoms of dehydration      Vomited once after getting home from dinner last night, then got up once during the nighttime to vomit. States she "tried" to throw up 3 times today, it is mostly phlegm now. Still endorses nausea 2. ONSET: "When did the vomiting begin?"      Last night after coming home from going out to dinner 3. FLUIDS: "What fluids or food have you vomited up today?" "Have you been able to keep any fluids down?"     Ate last night, tried to eat a little toast this AM but it did not taste good to her. Pt states she drank a little today, has not drank a lot. 4. ABDOMEN PAIN: "Are your having any abdomen pain?" If Yes : "How bad is it and what does it feel like?" (e.g., crampy, dull, intermittent, constant)      No 5. DIARRHEA: "Is there any diarrhea?" If Yes, ask: "How many times today?"      No  6. CONTACTS: "Is there anyone else in the family with the same symptoms?"      No  7. CAUSE: "What do you think is causing your vomiting?"     Food poisoning? 8. HYDRATION STATUS: "Any signs of dehydration?" (e.g., dry mouth [not only dry lips], too weak to stand) "When did you last urinate?"     "I feel a little woozy", a little dizzy. "As long  as I sit still its alright, but when I get up I am not real steady", states she has railings that go down the hall and she holds onto those 9. OTHER SYMPTOMS: "Do you have any other symptoms?" (e.g., fever, headache, vertigo, vomiting blood or coffee grounds, recent head injury)     "Woozy." No CP or SOB. Just nauseous. "My head hurt a little last night but not really anymore." No blurry vision. No hematemesis. No recent head injury  Protocols used: Vomiting-A-AH

## 2024-02-28 NOTE — Telephone Encounter (Signed)
**Note De-identified  [place] Obfuscation** Please advise 

## 2024-02-29 ENCOUNTER — Encounter: Payer: Self-pay | Admitting: Family Medicine

## 2024-02-29 LAB — CBC WITH DIFFERENTIAL/PLATELET
Basophils Absolute: 0 10*3/uL (ref 0.0–0.1)
Basophils Relative: 0.6 % (ref 0.0–3.0)
Eosinophils Absolute: 0.1 10*3/uL (ref 0.0–0.7)
Eosinophils Relative: 1.2 % (ref 0.0–5.0)
HCT: 39.2 % (ref 36.0–46.0)
Hemoglobin: 12.8 g/dL (ref 12.0–15.0)
Lymphocytes Relative: 25 % (ref 12.0–46.0)
Lymphs Abs: 1.3 10*3/uL (ref 0.7–4.0)
MCHC: 32.8 g/dL (ref 30.0–36.0)
MCV: 98 fl (ref 78.0–100.0)
Monocytes Absolute: 0.5 10*3/uL (ref 0.1–1.0)
Monocytes Relative: 8.9 % (ref 3.0–12.0)
Neutro Abs: 3.3 10*3/uL (ref 1.4–7.7)
Neutrophils Relative %: 64.3 % (ref 43.0–77.0)
Platelets: 111 10*3/uL — ABNORMAL LOW (ref 150.0–400.0)
RBC: 4 Mil/uL (ref 3.87–5.11)
RDW: 14 % (ref 11.5–15.5)
WBC: 5.2 10*3/uL (ref 4.0–10.5)

## 2024-02-29 LAB — COMPREHENSIVE METABOLIC PANEL WITH GFR
ALT: 20 U/L (ref 0–35)
AST: 31 U/L (ref 0–37)
Albumin: 4.3 g/dL (ref 3.5–5.2)
Alkaline Phosphatase: 79 U/L (ref 39–117)
BUN: 16 mg/dL (ref 6–23)
CO2: 28 meq/L (ref 19–32)
Calcium: 9.3 mg/dL (ref 8.4–10.5)
Chloride: 102 meq/L (ref 96–112)
Creatinine, Ser: 0.85 mg/dL (ref 0.40–1.20)
GFR: 61.46 mL/min (ref >60.00)
Glucose, Bld: 106 mg/dL — ABNORMAL HIGH (ref 70–99)
Potassium: 4.1 meq/L (ref 3.5–5.1)
Sodium: 139 meq/L (ref 135–145)
Total Bilirubin: 0.5 mg/dL (ref 0.2–1.2)
Total Protein: 7.1 g/dL (ref 6.0–8.3)

## 2024-03-03 ENCOUNTER — Telehealth: Payer: Self-pay | Admitting: Adult Health

## 2024-03-03 ENCOUNTER — Encounter: Payer: Self-pay | Admitting: Nurse Practitioner

## 2024-03-03 NOTE — Telephone Encounter (Signed)
 Copied from CRM [id number]. Topic: General - Other >> Mar 03, 2024  1:06 PM Freya Jesus wrote: Reason for CRM: Patient daughter called and is requesting a call back from provider or his nurse. Stated her mom has an appointment at [facility] - Tory Freiberg, NP on June 10th and she would like his opinion on this provider. Call back: [phone]

## 2024-03-04 NOTE — Telephone Encounter (Signed)
 Pt daughter advised that to Randel Buss is out of the office until next week. PT daughter stated that is fine to wait since pt appt. Is in June.

## 2024-03-05 ENCOUNTER — Other Ambulatory Visit: Payer: Self-pay | Admitting: Adult Health

## 2024-03-05 DIAGNOSIS — F419 Anxiety disorder, unspecified: Secondary | ICD-10-CM

## 2024-03-05 DIAGNOSIS — R519 Headache, unspecified: Secondary | ICD-10-CM

## 2024-03-11 NOTE — Telephone Encounter (Signed)
 Okay for refill?

## 2024-03-11 NOTE — Telephone Encounter (Signed)
 Patient daughter Jerlene Moody notified of update  and verbalized understanding.

## 2024-04-02 ENCOUNTER — Other Ambulatory Visit: Payer: Self-pay

## 2024-04-02 ENCOUNTER — Ambulatory Visit: Payer: Self-pay

## 2024-04-02 DIAGNOSIS — R110 Nausea: Secondary | ICD-10-CM

## 2024-04-02 MED ORDER — ONDANSETRON 4 MG PO TBDP: 4.0000 mg | ORAL_TABLET | Freq: Three times a day (TID) | ORAL | 0 refills | Status: DC | PRN

## 2024-04-02 NOTE — Telephone Encounter (Signed)
 Patient notified of update  and verbalized understanding.

## 2024-04-02 NOTE — Telephone Encounter (Signed)
 K to fill? Last prescribed by Angelo Kennedy.

## 2024-04-02 NOTE — Telephone Encounter (Signed)
 Chief Complaint: vomiting  Symptoms: vomiting  Frequency: since Sunday night/Monday AM Pertinent Negatives: Patient denies fever, CP, SOB, diarrhea, bloody stools, blurry vision, falls, head injury Disposition: [x] ED /[] Urgent Care (no appt availability in office) / [] Appointment(In office/virtual)/ []  [place] [facility]/ [] Home Care/ [x] Refused Recommended Disposition /[] [place] [facility]/ []  Follow-up with PCP Additional Notes: RN spoke to pt's daughter Jerlene Moody. Beth was on speaker phone and RN could hear the pt in the background. RN confirmed with the pt that this RN could speak with [facility] about PHI. Pt lives at [facility] and [facility] just got there an hour ago.  Pt has had vomiting since Sunday pm/Monday AM after going to a grandson's party Sunday pm and having pizza and cake. Beth states pt has vomiting 1x month and they have an appt with GI 6/10. Pt states she can't keep anything down and is spitting up clear phlegm. Denies diarrhea, abd pain, fever. Pt states she feels "kind of lightheaded" and endorses a headache. Pt was seen for similar symptoms 4/10 and prescribed Zofran  which she has not taken yet. Daughter states there are 3 Zofran  left.   RN advised daughter Jerlene Moody pt should be seen in the ED. Beth declined at this time and states she does not believe the pt needs that at this time. Beth stated she is going to give the pt Zofran  and encourage her to drink fluids. Beth asked if the Zofran  from 4/10 can be refilled. RN advised [facility] representative would relay that to the office for follow-up. RN advised Beth if pt develops CP, SOB, vomiting becomes more severe, she has diarrhea, abdominal pain that she needs to go to the ED. Beth verbalized understanding.    Summary: vomiting x 2days   Copied From CRM [id number]. Reason for Triage: vomiting x 2 days. Callback # [phone]     Reason for Disposition  [1] MODERATE vomiting (e.g., 3 - 5 times/day) AND [2] age > 60 years  Answer Assessment -  Initial Assessment Questions 1. VOMITING SEVERITY: "How many times have you vomited in the past 24 hours?"     - MILD:  1 - 2 times/day    - MODERATE: 3 - 5 times/day, decreased oral intake without significant weight loss or symptoms of dehydration    - SEVERE: 6 or more times/day, vomits everything or nearly everything, with significant weight loss, symptoms of dehydration      "Well I can't keep anything down, I gag and spit up clear phlegm" 2. ONSET: "When did the vomiting begin?"      Sunday night or Monday 3. FLUIDS: "What fluids or food have you vomited up today?" "Have you been able to keep any fluids down?"     "I've been trying to drink ginger ale, it doesn't stay down too long" 4. ABDOMEN PAIN: "Are your having any abdomen pain?" If Yes : "How bad is it and what does it feel like?" (e.g., crampy, dull, intermittent, constant)      No  5. DIARRHEA: "Is there any diarrhea?" If Yes, ask: "How many times today?"      No  6. CONTACTS: "Is there anyone else in the family with the same symptoms?"      No  7. CAUSE: "What do you think is causing your vomiting?"     Not sure  8. HYDRATION STATUS: "Any signs of dehydration?" (e.g., dry mouth [not only dry lips], too weak to stand) "When did you last urinate?"     "  A little" dizzy, "kind of lightheaded", able to stand and walk normally  9. OTHER SYMPTOMS: "Do you have any other symptoms?" (e.g., fever, headache, vertigo, vomiting blood or coffee grounds, recent head injury)     Denies diarrhea. Denies abdominal pain. Lives at [facility], no one else is sick. Pt went to a grandson's party Sunday night and had pizza and cake. Last ate Sunday. Had similar symptoms last month and had an appt 4/10 in which she was prescribed Zofran . Daughter states this happens once a month. Pt states she will take some of that today. 3 Zofran  left w/ no refills; has appt w/ GI 6/10. Daughter would like to know if Zofran  can be refilled  Endorses headache but  denies blurry vision  Protocols used: Vomiting-A-AH

## 2024-04-03 ENCOUNTER — Encounter: Payer: Self-pay | Admitting: Adult Health

## 2024-04-03 NOTE — Telephone Encounter (Signed)
 Look like pt still should be on this medication. Please advise

## 2024-04-04 ENCOUNTER — Other Ambulatory Visit: Payer: Self-pay | Admitting: Adult Health

## 2024-04-04 NOTE — Telephone Encounter (Signed)
 FYI

## 2024-04-29 ENCOUNTER — Encounter: Payer: Self-pay | Admitting: Nurse Practitioner

## 2024-04-29 ENCOUNTER — Ambulatory Visit (INDEPENDENT_AMBULATORY_CARE_PROVIDER_SITE_OTHER): Admitting: Nurse Practitioner

## 2024-04-29 VITALS — BP 148/70 | HR 60 | Ht 63.0 in | Wt 131.0 lb

## 2024-04-29 DIAGNOSIS — D696 Thrombocytopenia, unspecified: Secondary | ICD-10-CM

## 2024-04-29 DIAGNOSIS — R112 Nausea with vomiting, unspecified: Secondary | ICD-10-CM | POA: Diagnosis not present

## 2024-04-29 DIAGNOSIS — R1110 Vomiting, unspecified: Secondary | ICD-10-CM

## 2024-04-29 NOTE — Progress Notes (Signed)
 Agree with assessment and plan as outlined.

## 2024-04-29 NOTE — Patient Instructions (Signed)
 You have been scheduled for an Upper GI Series at [facility]. Your appointment is on 05/19/24 at 9:00 AM. Please arrive 15 minutes prior to your test for registration. Make certain not to have anything to eat or drink after midnight on the night before your test. If you need to reschedule, please contact radiology at [phone]. --------------------------------------------------------------------------------------------------------------- An upper GI series uses x rays to help diagnose problems of the upper GI tract, which includes the esophagus, stomach, and duodenum. The duodenum is the first part of the small intestine. An upper GI series is conducted by a radiology technologist or a radiologist--a doctor who specializes in x-ray imaging--at a hospital or outpatient center. While sitting or standing in front of an x-ray machine, the patient drinks barium liquid, which is often white and has a chalky consistency and taste. The barium liquid coats the lining of the upper GI tract and makes signs of disease show up more clearly on x rays. X-ray video, called fluoroscopy, is used to view the barium liquid moving through the esophagus, stomach, and duodenum. Additional x rays and fluoroscopy are performed while the patient lies on an x-ray table. To fully coat the upper GI tract with barium liquid, the technologist or radiologist may press on the abdomen or ask the patient to change position. Patients hold still in various positions, allowing the technologist or radiologist to take x rays of the upper GI tract at different angles. If a technologist conducts the upper GI series, a radiologist will later examine the images to look for problems.  This test typically takes about 1 hour to complete --------------------------------------------------------------------------------------------------------------------------------------------- The Small Bowel Follow Thru examination is used to visualize the  entire small bowel (intestines); specifically the connection between the small and large intestine. You will be positioned on a flat x-ray table and an image of your abdomen taken. Then the technologist will show the x-ray to the radiologist. The radiologist will instruct your technologist how much (1-2 cups) barium sulfate you will drink and when to begin taking the timed x-rays, usually 15-30 minutes after you begin drinking. Barium is a harmless substance that will highlight your small intestine by absorbing x-ray. The taste is chalky and it feels very heavy both in the cup and in your stomach.  After the first x-ray is taken and shown to the radiologist, he/she will determine when the next image is to be taken. This is repeated until the barium has reached the end of the small intestine and enters the beginning of the colon (cecum). At such time when the barium spills into the colon, you will be positioned on the x-ray table once again. The radiologist will use a fluoroscopic camera to take some detailed pictures of the connection between your small intestine and colon. The fluoroscope is an x-ray unit that works with a television/computer screen. The radiologist will apply pressure to your abdomen with his/her hand and a lead glove, a plastic paddle, or a paddle with an inflated rubber balloon on the end. This is to spread apart your loops of intestine so he/she can see all areas.   This test typically takes around 1 hour to complete.  Important: Drink plenty of water (8-10 cups/day) for a few days following the procedure to avoid constipation and blockage. The barium will make your stools white for a few days. --------------------------------------------------------------------------------------------------------------------------------------------   Contact our office if vomiting recurs.    Thank you for trusting me with your gastrointestinal care!   Everett Hitt, CRNP  _______________________________________________________  If your blood pressure at your visit was 140/90 or greater, please contact your primary care physician to follow up on this.  _______________________________________________________  If you are age 55 or older, your body mass index should be between 23-30. Your Body mass index is 23.21 kg/m. If this is out of the aforementioned range listed, please consider follow up with your Primary Care Provider.  If you are age 60 or younger, your body mass index should be between 19-25. Your Body mass index is 23.21 kg/m. If this is out of the aformentioned range listed, please consider follow up with your Primary Care Provider.   ________________________________________________________  The [facility] providers would like to encourage you to use MYCHART to communicate with providers for non-urgent requests or questions.  Due to long hold times on the telephone, sending your provider a message by [facility] may be a faster and more efficient way to get a response.  Please allow 48 business hours for a response.  Please remember that this is for non-urgent requests.  _______________________________________________________

## 2024-04-29 NOTE — Progress Notes (Signed)
 04/29/2024 Sheila Wolf [id number] January 02, 1934   CHIEF COMPLAINT: Vomiting  HISTORY OF PRESENT ILLNESS: Sheila Wolf is an 89 year old female with a past medical history of arthritis, osteoporosis, hypertension, cognitive impairment, thrombocytopenia and GERD.  She presents to our office today as referred by Cory Nafziger for further evaluation regarding recurrent episodes of vomiting. She is accompanied by her daughter who assists with obtaining the patient's symptom history. She started having episodes of abrupt vomiting approximately 1 year ago. She describes feeling well then all of a sudden developed abrupt vomiting which continues until her stomach empties then has dry heaves with intermittent nausea and vomiting for approximately 1 week.  No coffee-ground or hematemesis. Emesis sometimes consists of partially digested food. She has had about 5 episodes within the past year. No specific food or stress triggers. She endorses having a remote history of GERD, not taking acid reducing medications. No heartburn or dysphagia. She sometimes feels full easily and eat smaller meals.  No associated upper or lower abdominal pain. No weight loss. She has never had an EGD. Her most recent episode of abrupt vomiting followed by intermittent nausea and vomiting for 1 week occurred after eating pizza on 03/30/2024.  The episode prior to that, she ate steak at [facility] 02/27/2024 which resulted in numerous episodes of vomiting.  She was seen by her PCP 02/28/2024 and labs showed a WBC count of 5.2.  Hemoglobin 12.8.  Platelets 111.  Normal LFTs.  Prior CTAP 05/04/2023 was unrevealing, no intra-abdominal/pelvic pathology to explain her symptoms. She believes she has had 1 colonoscopy in the past, further details are unknown.  She was previously taking Mobic  daily and her PCP advised her to stop it a few months ago.  She typically passes a normal formed brown bowel movement daily,  infrequently has diarrhea.  No bloody or black stools.      Latest Ref Rng & Units 02/28/2024    4:25 PM 11/29/2023    8:33 AM 11/23/2023   11:58 AM  CBC  WBC 4.0 - 10.5 K/uL 5.2  4.9  8.6   Hemoglobin 12.0 - 15.0 g/dL 40.9  81.1  91.4   Hematocrit 36.0 - 46.0 % 39.2  40.6  40.4   Platelets 150.0 - 400.0 K/uL 111.0  125.0  133          Latest Ref Rng & Units 02/28/2024    4:25 PM 02/21/2024   10:31 AM 11/29/2023    8:33 AM  CMP  Glucose 70 - 99 mg/dL 782   956   BUN 6 - 23 mg/dL 16   16   Creatinine 2.13 - 1.20 mg/dL 0.86   5.78   Sodium 469 - 145 mEq/L 139   142   Potassium 3.5 - 5.1 mEq/L 4.1   3.5   Chloride 96 - 112 mEq/L 102   103   CO2 19 - 32 mEq/L 28   30   Calcium  8.4 - 10.5 mg/dL 9.3   9.3   Total Protein 6.0 - 8.3 g/dL 7.1  6.9  7.1   Total Bilirubin 0.2 - 1.2 mg/dL 0.5  0.4  0.5   Alkaline Phos 39 - 117 U/L 79  85  85   AST 0 - 37 U/L 31  40  50   ALT 0 - 35 U/L 20  27  36     CTAP with contrast 05/04/2023:  FINDINGS: Lower chest: No acute abnormality.   Hepatobiliary: No focal  liver abnormality is seen. No gallstones, gallbladder wall thickening, or biliary dilatation.   Pancreas: Unremarkable. No pancreatic ductal dilatation or surrounding inflammatory changes.   Spleen: Normal.   Adrenals/Urinary Tract: Adrenal glands are unremarkable. Kidneys are normal, without renal calculi, focal lesion, or hydronephrosis. Bladder is unremarkable.   Stomach/Bowel: Normal stomach and duodenum. No dilated loops of small bowel. Appendix is not visualized. No bowel wall thickening or surrounding inflammation.   Vascular/Lymphatic: Aortic atherosclerosis. No enlarged abdominal or pelvic lymph nodes.   Reproductive: Uterus and bilateral adnexa are unremarkable.   Other: No abdominal wall hernia or abnormality. No abdominopelvic ascites.   Musculoskeletal: No acute bone findings. Grade 1 anterolisthesis of L4 on L5 with prior left posterior spinal fusion at this  level.   IMPRESSION: No CT evidence of acute abdominal/pelvic process.   Aortic Atherosclerosis    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HCC)    Urinary incontinence    Past Surgical History:  Procedure Laterality Date   APPENDECTOMY     BUNIONECTOMY     CERVICAL SPINE SURGERY     SHOULDER SURGERY     TONSILLECTOMY AND ADENOIDECTOMY     Social History: She is widowed. She has 1 son and 1 daughter. Retired Runner, broadcasting/film/video. Past smoker. No alcohol use. No drug use.  Family History: family history includes Alcohol abuse in her father; Arthritis in her mother; Birth defects in her brother; Early death in her father; Hearing loss in her mother; Heart disease in her brother, father, and mother; Hypertension in her mother; Stroke in her father.  No known family history of esophageal, gastric or colon cancer.  Allergies  Allergen Reactions   Penicillins Rash     Outpatient Encounter Medications as of 04/29/2024  Medication Sig   atorvastatin  (LIPITOR) 40 MG tablet TAKE 1 TABLET BY MOUTH EVERY DAY   butalbital -acetaminophen-caffeine  (FIORICET) 50-325-40 MG tablet TAKE 1 TABLET BY MOUTH 4 TIMES DAILY AS NEEDED HEADACHE   cetirizine  (ZYRTEC ) 10 MG tablet TAKE 1 TABLET BY MOUTH EVERY DAY   cycloSPORINE (RESTASIS) 0.05 % ophthalmic emulsion 1 drop 2 (two) times daily.   escitalopram  (LEXAPRO ) 20 MG tablet TAKE 1 TABLET BY MOUTH ONCE DAILY   Melatonin 5 MG CAPS Take by mouth.   metoprolol  succinate (TOPROL -XL) 50 MG 24 hr tablet TAKE 1 TABLET BY MOUTH EVERY DAY   ondansetron  (ZOFRAN -ODT) 4 MG disintegrating tablet Take 1 tablet (4 mg total) by mouth every 8 (eight) hours as needed for nausea or vomiting.   meloxicam  (MOBIC ) 15 MG tablet TAKE 1 TABLET BY MOUTH EVERY DAY (Patient not taking: Reported on 04/29/2024)   No facility-administered  encounter medications on file as of 04/29/2024.   REVIEW OF SYSTEMS:  Gen: Denies fever, sweats or chills. No weight loss.  CV: Denies chest pain, palpitations or edema. Resp: Denies cough, shortness of breath of hemoptysis.  GI: See HPI. GU: Denies urinary burning, blood in urine, increased urinary frequency or incontinence. MS: + Arthritis.  Derm: Denies rash, itchiness, skin lesions or unhealing ulcers. Psych: Denies depression, anxiety, memory loss or confusion. Heme: Denies bruising, easy bleeding. Neuro: Mild memory deficit. Endo:  Denies any problems with DM, thyroid  or adrenal function.  PHYSICAL EXAM: Ht 5\' 3"  (1.6 m)   Wt 131 lb (59.4 kg)   BMI 23.21  kg/m   Wt Readings from Last 3 Encounters:  04/29/24 131 lb (59.4 kg)  02/28/24 130 lb (59 kg)  02/21/24 131 lb (59.4 kg)    General: 89 year old female in no acute distress. Head: Normocephalic and atraumatic. Eyes:  Sclerae non-icteric, conjunctive pink. Ears: Normal auditory acuity. Mouth: Dentition intact. No ulcers or lesions.  Neck: Supple, no lymphadenopathy or thyromegaly.  Lungs: Clear bilaterally to auscultation without wheezes, crackles or rhonchi. Heart: Regular rate and rhythm. No murmur, rub or gallop appreciated.  Abdomen: Soft, nontender, nondistended. No masses. No hepatosplenomegaly. Normoactive bowel sounds x 4 quadrants.  Rectal: Deferred. Musculoskeletal: Symmetrical with no gross deformities. Skin: Warm and dry. No rash or lesions on visible extremities. Extremities: No edema. Neurological: Alert oriented x 4, no focal deficits.  Psychological: Alert and cooperative. Normal mood and affect.  ASSESSMENT AND PLAN:  89 year old female with episodic abrupt onset vomiting without vomiting which triggers ongoing nausea with vomiting and dry heaves for the next week, no associated abdominal pain.  No weight loss.  No specific food or stress triggers.  CTAP 04/2023 was unrevealing. - Upper GI series -  Consider EGD if upper GI series abnormal or if vomiting recurs - Ondansetron  ODT 4 mg 1 tab dissolved on tongue every 8 hours as needed, patient will contact her office if she needs RX - Patient/daughter to contact office if vomiting recurs, we will recheck labs including CBC, CMP and lipase level at time of next episode  Remote history of GERD, Mobic  discontinued 1 or 2 months ago.  No GERD symptoms or upper abdominal pain. - See plan above - Initiate PPI if vomiting recurs or if she develops GERD symptoms  Thrombocytopenia, idiopathic.  Normal spleen and liver per CTAP 04/2023.    CC:  Alto Atta, NP

## 2024-05-19 ENCOUNTER — Other Ambulatory Visit: Payer: Self-pay | Admitting: Nurse Practitioner

## 2024-05-19 ENCOUNTER — Ambulatory Visit: Payer: Self-pay | Admitting: Nurse Practitioner

## 2024-05-19 ENCOUNTER — Ambulatory Visit (HOSPITAL_COMMUNITY)
Admission: RE | Admit: 2024-05-19 | Discharge: 2024-05-19 | Disposition: A | Discharge status: Home / Self Care | Source: Ambulatory Visit | Attending: Nurse Practitioner | Admitting: Nurse Practitioner

## 2024-05-19 DIAGNOSIS — R112 Nausea with vomiting, unspecified: Secondary | ICD-10-CM

## 2024-05-19 DIAGNOSIS — R1110 Vomiting, unspecified: Secondary | ICD-10-CM

## 2024-05-19 DIAGNOSIS — K224 Dyskinesia of esophagus: Secondary | ICD-10-CM | POA: Diagnosis not present

## 2024-06-03 DIAGNOSIS — L821 Other seborrheic keratosis: Secondary | ICD-10-CM | POA: Diagnosis not present

## 2024-06-03 DIAGNOSIS — L814 Other melanin hyperpigmentation: Secondary | ICD-10-CM | POA: Diagnosis not present

## 2024-06-03 DIAGNOSIS — L820 Inflamed seborrheic keratosis: Secondary | ICD-10-CM | POA: Diagnosis not present

## 2024-06-11 ENCOUNTER — Telehealth: Payer: Self-pay | Admitting: Nurse Practitioner

## 2024-06-11 NOTE — Telephone Encounter (Signed)
 Left message for patient to call back

## 2024-06-11 NOTE — Telephone Encounter (Signed)
 Dottie, thank you for the update. Pls have patient or daughter contact our office if she has any nausea or vomiting and we can facilitate scheduling UGI at that time.

## 2024-06-11 NOTE — Telephone Encounter (Signed)
 Spoke to patient to discuss that a barium swallow was inadvertently completed on her although we ordered a different test, an Upper GI. Patient is advised that we would like to have her complete the Upper GI and radiology has explained this will be at no additional cost to her. Patient states that radiology never told her about this error but states she is actually doing well right now and hasn't had vomiting in months. She requests to defer any additional testing for now.

## 2024-06-11 NOTE — Telephone Encounter (Signed)
 Linda/Dottie, pls contact patient to facilitate re-scheduling an UGI series. At the time of her last office visit, I ordered an UGI series but there was an error per radiology. The study was inadvertently performed as an esophagram instead of an upper GI.  See barium swallow study note. Per radiology note The patient will returned for further imaging at no cost. Doesn't look like UGI has yet been completed. Pls schedule UGI if patient is willing to do so. THX.

## 2024-06-12 NOTE — Telephone Encounter (Signed)
 Patient was advised to call back if she had return in symptoms.

## 2024-06-13 ENCOUNTER — Other Ambulatory Visit: Payer: Self-pay | Admitting: Adult Health

## 2024-07-08 DIAGNOSIS — L820 Inflamed seborrheic keratosis: Secondary | ICD-10-CM | POA: Diagnosis not present

## 2024-07-09 ENCOUNTER — Ambulatory Visit (INDEPENDENT_AMBULATORY_CARE_PROVIDER_SITE_OTHER): Admitting: Adult Health

## 2024-07-09 ENCOUNTER — Other Ambulatory Visit: Payer: Self-pay | Admitting: Adult Health

## 2024-07-09 ENCOUNTER — Encounter: Payer: Self-pay | Admitting: Adult Health

## 2024-07-09 VITALS — BP 110/60 | HR 62 | Temp 98.4°F | Ht 63.0 in | Wt 131.0 lb

## 2024-07-09 DIAGNOSIS — R519 Headache, unspecified: Secondary | ICD-10-CM

## 2024-07-09 DIAGNOSIS — H6121 Impacted cerumen, right ear: Secondary | ICD-10-CM

## 2024-07-09 DIAGNOSIS — L821 Other seborrheic keratosis: Secondary | ICD-10-CM | POA: Diagnosis not present

## 2024-07-09 NOTE — Progress Notes (Signed)
 Subjective:    Patient ID: Sheila Wolf, female    DOB: 07/04/1934, 89 y.o.   MRN: [id number]  HPI  89 year old female who  has a past medical history of Anxiety and depression, Arthritis, Frequent headaches, GERD (gastroesophageal reflux disease), Hypertension, Osteoporosis, Thrombocytopathia (HCC), and Urinary incontinence.  She presents to the office today for the feeling of her  right ear being clogged. She has loss of hearing . Denies pain.   She also has two  skin tags on the right side of her neck that are irritated by clothing and jewelry. She does report that the nurse at her facility has tried freezing them off.     Review of Systems See HPI   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HCC)    Urinary incontinence     Social History   Socioeconomic History   Marital status: Widowed    Spouse name: Not on file   Number of children: Not on file   Years of education: Not on file   Highest education level: Not on file  Occupational History   Not on file  Tobacco Use   Smoking status: Former    Types: Cigarettes   Smokeless tobacco: Never  Vaping Use   Vaping status: Never Used  Substance and Sexual Activity   Alcohol use: Yes    Alcohol/week: 10.0 standard drinks of alcohol    Types: 10 Glasses of wine per week   Drug use: No   Sexual activity: Not Currently  Other Topics Concern   Not on file  Social History Narrative   Not on file   Social Drivers of Health   Financial Resource Strain: Low Risk  (07/12/2023)   Overall Financial Resource Strain (CARDIA)    Difficulty of Paying Living Expenses: Not hard at all  Food Insecurity: No Food Insecurity (07/12/2023)   Hunger Vital Sign    Worried About Running Out of Food in the Last Year: Never true    Ran Out of Food in the Last Year:  Never true  Transportation Needs: No Transportation Needs (07/12/2023)   PRAPARE - Administrator, Civil Service (Medical): No    Lack of Transportation (Non-Medical): No  Physical Activity: Inactive (07/12/2023)   Exercise Vital Sign    Days of Exercise per Week: 0 days    Minutes of Exercise per Session: 0 min  Stress: No Stress Concern Present (07/12/2023)   [employer] of Occupational Health - Occupational Stress Questionnaire    Feeling of Stress : Not at all  Social Connections: Moderately Isolated (07/12/2023)   Social Connection and Isolation Panel    Frequency of Communication with Friends and Family: More than three times a week    Frequency of Social Gatherings with Friends and Family: Once a week    Attends Religious Services: More than 4 times per year    Active Member of [employer] or Organizations: No    Attends Banker Meetings: Never    Marital Status: Widowed  Intimate Partner Violence: Not At Risk (07/12/2023)   Humiliation, Afraid, Rape, and Kick questionnaire    Fear of Current or Ex-Partner: No    Emotionally Abused: No    Physically Abused: No    Sexually Abused: No    Past  Surgical History:  Procedure Laterality Date   APPENDECTOMY     BUNIONECTOMY     CERVICAL SPINE SURGERY     SHOULDER SURGERY     TONSILLECTOMY AND ADENOIDECTOMY      Family History  Problem Relation Age of Onset   Arthritis Mother    Hearing loss Mother    Heart disease Mother    Hypertension Mother    Alcohol abuse Father    Early death Father    Heart disease Father    Stroke Father    Birth defects Brother    Heart disease Brother     Allergies  Allergen Reactions   Shellfish-Derived Products     Makes her stomach hurt    Penicillins Rash    Current Outpatient Medications on File Prior to Visit  Medication Sig Dispense Refill   atorvastatin  (LIPITOR) 40 MG tablet TAKE 1 TABLET BY MOUTH EVERY DAY 90 tablet 0    butalbital -acetaminophen-caffeine  (FIORICET) 50-325-40 MG tablet TAKE 1 TABLET BY MOUTH 4 TIMES DAILY AS NEEDED HEADACHE 60 tablet 2   cetirizine  (ZYRTEC ) 10 MG tablet TAKE 1 TABLET BY MOUTH EVERY DAY 90 tablet 3   cycloSPORINE (RESTASIS) 0.05 % ophthalmic emulsion 1 drop 2 (two) times daily.     escitalopram  (LEXAPRO ) 20 MG tablet TAKE 1 TABLET BY MOUTH ONCE DAILY 90 tablet 1   Melatonin 5 MG CAPS Take by mouth.     meloxicam  (MOBIC ) 15 MG tablet TAKE 1 TABLET BY MOUTH EVERY DAY 90 tablet 3   metoprolol  succinate (TOPROL -XL) 50 MG 24 hr tablet TAKE 1 TABLET BY MOUTH EVERY DAY 90 tablet 3   ondansetron  (ZOFRAN -ODT) 4 MG disintegrating tablet Take 1 tablet (4 mg total) by mouth every 8 (eight) hours as needed for nausea or vomiting. 20 tablet 0   No current facility-administered medications on file prior to visit.    BP 110/60   Pulse 62   Temp 98.4 F (36.9 C) (Oral)   Ht 5' 3 (1.6 m)   Wt 131 lb (59.4 kg)   SpO2 97%   BMI 23.21 kg/m       Objective:   Physical Exam Vitals and nursing note reviewed.  Constitutional:      Appearance: Normal appearance.  HENT:     Right Ear: There is impacted cerumen.     Left Ear: Tympanic membrane, ear canal and external ear normal.  Neck:   Musculoskeletal:        General: Normal range of motion.  Skin:    General: Skin is warm and dry.  Neurological:     General: No focal deficit present.     Mental Status: She is alert and oriented to person, place, and time.  Psychiatric:        Mood and Affect: Mood normal.        Behavior: Behavior normal.        Thought Content: Thought content normal.        Judgment: Judgment normal.        Assessment & Plan:  1. Impacted cerumen of right ear (Primary) Ear Cerumen Removal  Date/Time: 07/09/2024 2:11 PM  Performed by: Merna Huxley, NP Authorized by: Merna Huxley, NP   Anesthesia: Local Anesthetic: none Location details: right ear Patient tolerance: patient tolerated the  procedure well with no immediate complications Comments: Hearing restored  Procedure type: irrigation  Sedation: Patient sedated: no     2. Seborrheic keratosis Verbal consent obtained. Using liquid nitrogen both seborrheic  keratosis were frozen with three freeze thaw cycles. She tolerated procedure well. Aftercare instructions reviewed   Darleene Shape, NP

## 2024-07-10 NOTE — Telephone Encounter (Signed)
 Okay for refill?

## 2024-07-24 ENCOUNTER — Ambulatory Visit: Admitting: Adult Health

## 2024-07-25 ENCOUNTER — Ambulatory Visit: Admitting: Family Medicine

## 2024-07-25 ENCOUNTER — Encounter: Payer: Self-pay | Admitting: Family Medicine

## 2024-07-25 VITALS — BP 140/60 | HR 63 | Temp 98.7°F | Wt 134.4 lb

## 2024-07-25 DIAGNOSIS — H6993 Unspecified Eustachian tube disorder, bilateral: Secondary | ICD-10-CM | POA: Diagnosis not present

## 2024-07-25 NOTE — Progress Notes (Signed)
   Subjective:    Patient ID: Sheila Wolf, female    DOB: 13-May-1934, 89 y.o.   MRN: [id number]  HPI Here to check her ears. On 07-09-24 she saw [facility] for a cerumen impaction in the right ear. This was irrigated clear with water. Since then she has experienced some slight head congestion, and she says it sounds like I am talking out of a barrel). No ear pain.    Review of Systems  Constitutional: Negative.   HENT:  Positive for congestion. Negative for ear discharge, ear pain, hearing loss, postnasal drip and sinus pressure.   Eyes: Negative.   Respiratory: Negative.         Objective:   Physical Exam Constitutional:      Appearance: Normal appearance.  HENT:     Right Ear: Tympanic membrane, ear canal and external ear normal.     Left Ear: Tympanic membrane, ear canal and external ear normal.     Nose: Nose normal.     Mouth/Throat:     Pharynx: Oropharynx is clear.  Eyes:     Conjunctiva/sclera: Conjunctivae normal.  Pulmonary:     Effort: Pulmonary effort is normal.     Breath sounds: Normal breath sounds.  Neurological:     Mental Status: She is alert.           Assessment & Plan:  She has some eustachian tube dysfunction, and I advised her to try taking Mucinex for this. I assured her that this has nothing to do with the ear irrigation that was done here.  Garnette Olmsted, MD

## 2024-08-07 ENCOUNTER — Telehealth: Payer: Self-pay | Admitting: *Deleted

## 2024-08-07 NOTE — Telephone Encounter (Signed)
 Ok to schedule pt for ov

## 2024-08-07 NOTE — Telephone Encounter (Signed)
 Copied from CRM [id number]. Topic: Referral - Request for Referral >> Aug 07, 2024  1:08 PM Delon DASEN wrote: Did the patient discuss referral with their provider in the last year? Yes (If No - schedule appointment) (If Yes - send message)  Appointment offered? No  Type of order/referral and detailed reason for visit: hearing test needed for eval  Preference of office, provider, location: [place]  If referral order, have you been seen by this specialty before? No (If Yes, this issue or another issue? When? Where?  Can we respond through MyChart? Yes

## 2024-08-08 NOTE — Telephone Encounter (Signed)
 Left message to return phone call.

## 2024-08-08 NOTE — Telephone Encounter (Signed)
 Noted

## 2024-08-12 ENCOUNTER — Encounter: Payer: Self-pay | Admitting: Adult Health

## 2024-08-12 ENCOUNTER — Ambulatory Visit (INDEPENDENT_AMBULATORY_CARE_PROVIDER_SITE_OTHER): Admitting: Adult Health

## 2024-08-12 VITALS — BP 130/70 | HR 53 | Temp 98.2°F | Ht 63.0 in | Wt 132.0 lb

## 2024-08-12 DIAGNOSIS — H9193 Unspecified hearing loss, bilateral: Secondary | ICD-10-CM

## 2024-08-12 DIAGNOSIS — R4189 Other symptoms and signs involving cognitive functions and awareness: Secondary | ICD-10-CM | POA: Diagnosis not present

## 2024-08-12 NOTE — Progress Notes (Signed)
 Subjective:    Patient ID: Sheila Wolf, female    DOB: 1934/07/18, 89 y.o.   MRN: [id number]  HPI 89 year old female who  has a past medical history of Anxiety and depression, Arthritis, Frequent headaches, GERD (gastroesophageal reflux disease), Hypertension, Osteoporosis, Thrombocytopathia (HCC), and Urinary incontinence.  She presents to the office today for hearing loss. She reports that since she had her ears cleaned out in August that she has had loss of hearing.  Her daughter who is with her today reports that in the last month the patient seems to have to turn up her TV louder, is asking people to repeat themselves and has a hard time following a conversation in a restaurant.  Supposedly the patient went to [facility] and made an appointment but forgot about the appointment and then when her daughter asked her about the appointment could not remember why she made it. She has known age related cognitive impairment.     BP Readings from Last 3 Encounters:  08/12/24 130/70  07/25/24 (!) 140/60  07/09/24 110/60      08/12/2024   11:58 AM 05/31/2023   11:59 AM 11/09/2022   11:15 AM  MMSE - Mini Mental State Exam  Orientation to time 5 5 5   Orientation to Place 5 5 5   Registration 3 3 3   Attention/ Calculation 5 5 5   Recall 2 3 3   Language- name 2 objects 2 2 2   Language- repeat 1 1 1   Language- follow 3 step command 3 3 3   Language- read & follow direction 1 1 1   Write a sentence 1 1 1   Copy design 1 1 1   Total score 29 30 30     Review of Systems See HPI   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HCC)    Urinary incontinence     Social History   Socioeconomic History   Marital status: Widowed    Spouse name: Not on file   Number of children: Not on file   Years of education: Not on file    Highest education level: Not on file  Occupational History   Not on file  Tobacco Use   Smoking status: Former    Types: Cigarettes   Smokeless tobacco: Never  Vaping Use   Vaping status: Never Used  Substance and Sexual Activity   Alcohol use: Yes    Alcohol/week: 10.0 standard drinks of alcohol    Types: 10 Glasses of wine per week   Drug use: No   Sexual activity: Not Currently  Other Topics Concern   Not on file  Social History Narrative   Not on file   Social Drivers of Health   Financial Resource Strain: Low Risk  (07/12/2023)   Overall Financial Resource Strain (CARDIA)    Difficulty of Paying Living Expenses: Not hard at all  Food Insecurity: No Food Insecurity (07/12/2023)   Hunger Vital Sign    Worried About Running Out of Food in the Last Year: Never true    Ran Out of Food in the Last Year: Never true  Transportation Needs: No Transportation Needs (07/12/2023)   PRAPARE - Administrator, Civil Service (Medical): No    Lack of Transportation (Non-Medical): No  Physical Activity: Inactive (07/12/2023)   Exercise Vital  Sign    Days of Exercise per Week: 0 days    Minutes of Exercise per Session: 0 min  Stress: No Stress Concern Present (07/12/2023)   [employer] of Occupational Health - Occupational Stress Questionnaire    Feeling of Stress : Not at all  Social Connections: Moderately Isolated (07/12/2023)   Social Connection and Isolation Panel    Frequency of Communication with Friends and Family: More than three times a week    Frequency of Social Gatherings with Friends and Family: Once a week    Attends Religious Services: More than 4 times per year    Active Member of [employer] or Organizations: No    Attends Banker Meetings: Never    Marital Status: Widowed  Intimate Partner Violence: Not At Risk (07/12/2023)   Humiliation, Afraid, Rape, and Kick questionnaire    Fear of Current or Ex-Partner: No    Emotionally Abused: No     Physically Abused: No    Sexually Abused: No    Past Surgical History:  Procedure Laterality Date   APPENDECTOMY     BUNIONECTOMY     CERVICAL SPINE SURGERY     SHOULDER SURGERY     TONSILLECTOMY AND ADENOIDECTOMY      Family History  Problem Relation Age of Onset   Arthritis Mother    Hearing loss Mother    Heart disease Mother    Hypertension Mother    Alcohol abuse Father    Early death Father    Heart disease Father    Stroke Father    Birth defects Brother    Heart disease Brother     Allergies  Allergen Reactions   Shellfish-Derived Products     Makes her stomach hurt    Penicillins Rash    Current Outpatient Medications on File Prior to Visit  Medication Sig Dispense Refill   atorvastatin  (LIPITOR) 40 MG tablet TAKE 1 TABLET BY MOUTH EVERY DAY 90 tablet 0   butalbital -acetaminophen-caffeine  (FIORICET) 50-325-40 MG tablet TAKE 1 TABLET BY MOUTH 4 TIMES DAILY AS NEEDED FOR HEADACHE 60 tablet 2   cetirizine  (ZYRTEC ) 10 MG tablet TAKE 1 TABLET BY MOUTH EVERY DAY 90 tablet 3   cycloSPORINE (RESTASIS) 0.05 % ophthalmic emulsion 1 drop 2 (two) times daily.     escitalopram  (LEXAPRO ) 20 MG tablet TAKE 1 TABLET BY MOUTH ONCE DAILY 90 tablet 1   Melatonin 5 MG CAPS Take by mouth.     meloxicam  (MOBIC ) 15 MG tablet TAKE 1 TABLET BY MOUTH EVERY DAY 90 tablet 3   metoprolol  succinate (TOPROL -XL) 50 MG 24 hr tablet TAKE 1 TABLET BY MOUTH EVERY DAY 90 tablet 3   ondansetron  (ZOFRAN -ODT) 4 MG disintegrating tablet Take 1 tablet (4 mg total) by mouth every 8 (eight) hours as needed for nausea or vomiting. 20 tablet 0   No current facility-administered medications on file prior to visit.    BP 130/70   Pulse (!) 53   Temp 98.2 F (36.8 C) (Oral)   Ht 5' 3 (1.6 m)   Wt 132 lb (59.9 kg)   SpO2 98%   BMI 23.38 kg/m       Objective:   Physical Exam Vitals and nursing note reviewed.  Constitutional:      Appearance: Normal appearance.  HENT:     Right Ear: Tympanic  membrane, ear canal and external ear normal. No middle ear effusion. There is no impacted cerumen. Tympanic membrane is not erythematous or bulging.  Left Ear: Tympanic membrane, ear canal and external ear normal.  No middle ear effusion. There is no impacted cerumen. Tympanic membrane is not erythematous or bulging.  Cardiovascular:     Rate and Rhythm: Normal rate and regular rhythm.     Pulses: Normal pulses.     Heart sounds: Normal heart sounds.  Pulmonary:     Effort: Pulmonary effort is normal.     Breath sounds: Normal breath sounds.  Musculoskeletal:        General: Normal range of motion.  Skin:    General: Skin is warm and dry.  Neurological:     General: No focal deficit present.     Mental Status: She is alert and oriented to person, place, and time.  Psychiatric:        Mood and Affect: Mood normal.        Behavior: Behavior normal.        Thought Content: Thought content normal.        Judgment: Judgment normal.        Assessment & Plan:  1. Bilateral hearing loss, unspecified hearing loss type (Primary) - She did have some hearing loss on exam today in the 2000-4000 hz range.  - Ambulatory referral to Audiology  2. Cognitive impairment \- MMSE 29/30  - Continue to monitor    Darleene Shape, NP  I personally spent a total of 44 minutes in the care of the patient today including preparing to see the patient, getting/reviewing separately obtained history, performing a medically appropriate exam/evaluation, counseling and educating, placing orders, and documenting clinical information in the EHR.

## 2024-08-12 NOTE — Addendum Note (Signed)
 Addended by: MERNA DARLEENE CROME on: 08/12/2024 12:25 PM   Modules accepted: Level of Service

## 2024-08-15 DIAGNOSIS — H906 Mixed conductive and sensorineural hearing loss, bilateral: Secondary | ICD-10-CM | POA: Diagnosis not present

## 2024-08-27 ENCOUNTER — Encounter (INDEPENDENT_AMBULATORY_CARE_PROVIDER_SITE_OTHER): Payer: Self-pay

## 2024-08-27 ENCOUNTER — Institutional Professional Consult (permissible substitution) (INDEPENDENT_AMBULATORY_CARE_PROVIDER_SITE_OTHER)

## 2024-08-27 ENCOUNTER — Ambulatory Visit (INDEPENDENT_AMBULATORY_CARE_PROVIDER_SITE_OTHER)

## 2024-08-27 VITALS — BP 163/65 | HR 62 | Temp 97.3°F | Ht 62.0 in | Wt 135.0 lb

## 2024-08-27 DIAGNOSIS — H900 Conductive hearing loss, bilateral: Secondary | ICD-10-CM

## 2024-08-27 MED ORDER — FLUTICASONE PROPIONATE 50 MCG/ACT NA SUSP: 2.0000 | Freq: Every day | NASAL | 6 refills | Status: AC

## 2024-08-27 MED ORDER — METHYLPREDNISOLONE 4 MG PO TBPK
ORAL_TABLET | ORAL | 0 refills | Status: AC
Start: 2024-08-27 — End: ?

## 2024-08-27 NOTE — Progress Notes (Signed)
 HPI:   Sheila Wolf is a 89 y.o. female who presents as a new patient in consultation for hearing loss. Patient states that in August of this year she presented to her PCP and had her ears flushed. After this she felt as though her right ear had diminished hearing. She denies any history of asymmetry in her hearing prior to this. She states that she did have an audiogram done at [facility] which showed bilateral conductive hearing loss. Patient states that she feels that her hearing in her right ear feels like she is in a tunnel. She was unaware of her hearing loss in her left ear prior to the audiogram. She denies any recurrent ear infections. Denies any pain or drainage from the ears. Denies any dizziness or imbalance. Denies any head trauma.     PMH/Meds/All/SocHx/FamHx/ROS: 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HCC)    Urinary incontinence    Past Surgical History:  Procedure Laterality Date   APPENDECTOMY     BUNIONECTOMY     CERVICAL SPINE SURGERY     SHOULDER SURGERY     TONSILLECTOMY AND ADENOIDECTOMY     No family history of bleeding disorders, wound healing problems or difficulty with anesthesia.  Social Connections: Moderately Isolated (07/12/2023)   Social Connection and Isolation Panel    Frequency of Communication with Friends and Family: More than three times a week    Frequency of Social Gatherings with Friends and Family: Once a week    Attends Religious Services: More than 4 times per year    Active Member of [employer] or Organizations: No    Attends Banker Meetings: Never    Marital Status: Widowed    Current Outpatient Medications:    atorvastatin  (LIPITOR) 40 MG tablet, TAKE 1 TABLET BY MOUTH EVERY DAY, Disp: 90 tablet, Rfl: 0   butalbital -acetaminophen-caffeine  (FIORICET) 50-325-40 MG  tablet, TAKE 1 TABLET BY MOUTH 4 TIMES DAILY AS NEEDED FOR HEADACHE, Disp: 60 tablet, Rfl: 2   cetirizine  (ZYRTEC ) 10 MG tablet, TAKE 1 TABLET BY MOUTH EVERY DAY, Disp: 90 tablet, Rfl: 3   cycloSPORINE (RESTASIS) 0.05 % ophthalmic emulsion, 1 drop 2 (two) times daily., Disp: , Rfl:    escitalopram  (LEXAPRO ) 20 MG tablet, TAKE 1 TABLET BY MOUTH ONCE DAILY, Disp: 90 tablet, Rfl: 1   Melatonin 5 MG CAPS, Take by mouth., Disp: , Rfl:    meloxicam  (MOBIC ) 15 MG tablet, TAKE 1 TABLET BY MOUTH EVERY DAY, Disp: 90 tablet, Rfl: 3   metoprolol  succinate (TOPROL -XL) 50 MG 24 hr tablet, TAKE 1 TABLET BY MOUTH EVERY DAY, Disp: 90 tablet, Rfl: 3   ondansetron  (ZOFRAN -ODT) 4 MG disintegrating tablet, Take 1 tablet (4 mg total) by mouth every 8 (eight) hours as needed for nausea or vomiting., Disp: 20 tablet, Rfl: 0 A complete ROS was performed with pertinent positives/negatives noted in the HPI. The remainder of the ROS are negative.   Physical Exam:  There were no vitals taken for this visit. General: Well developed, well nourished. No acute distress. Voice normal Head/Face: Normocephalic. No sinus tenderness. Facial nerve intact and equal bilaterally. No facial lacerations. Eyes: PERRL, no scleral icterus or conjunctival hemorrhage. EOMI. Ears: No gross deformity. Normal external canal. Tympanic membrane in tact bilaterally. Middle ear effusion bilaterally L>R. Right TM retracted  Hearing: Normal speech reception.  Nose: No gross deformity or lesions. No purulent discharge. No turbinate hypertrophy. Mouth/Oropharynx: Lips without any lesions. Dentition fair. No mucosal lesions within the oropharynx. No tonsillar enlargement, exudate, or lesions. Pharyngeal walls symmetrical. Uvula midline. Tongue midline without lesions. Neck: Trachea midline. No masses. No thyromegaly or nodules palpated. No crepitus. Lymphatic: No lymphadenopathy in the neck. Respiratory: No stridor or distress. Room air. Cardiovascular:  Regular rate and rhythm. Extremities: No edema or cyanosis. Warm and well-perfused. Skin: No scars or lesions on face or neck. Neurologic: CN II-XII grossly intact. Moving all extremities without gross abnormality. Other:  Independent Review of Additional Tests or Records: Audiogram 08/15/2024 - Right ear - normal to severe conductive hearing loss with A(d) tympanogram - Left ear - mild to severe conductive hearing loss with Type B tympanogram  Procedures: None Impression & Plans: Sathvika Ojo is a 89 y.o. female with bilateral conductive hearing loss and middle ear effusion seen on exam.  Bilateral conductive hearing loss - start flonase  daily - start medrol  dose pack - CT temporal bone ordered - Repeat audiogram in 4-6 weeks for resolution after medical management  Follow-up in 4-6 weeks after audiogram and CT temporal bone. Will consider tympanostomy tube if effusion unresolved.   Adah Malkin, DO [facility]

## 2024-09-03 ENCOUNTER — Ambulatory Visit

## 2024-09-03 VITALS — BP 120/62 | HR 60 | Temp 98.5°F | Ht 62.0 in | Wt 125.2 lb

## 2024-09-03 DIAGNOSIS — Z0000 Encounter for general adult medical examination without abnormal findings: Secondary | ICD-10-CM

## 2024-09-03 NOTE — Patient Instructions (Addendum)
 [name],  Thank you for taking the time for your Medicare Wellness Visit. I appreciate your continued commitment to your health goals. Please review the care plan we discussed, and feel free to reach out if I can assist you further.  Medicare recommends these wellness visits once per year to help you and your care team stay ahead of potential health issues. These visits are designed to focus on prevention, allowing your provider to concentrate on managing your acute and chronic conditions during your regular appointments.  Please note that Annual Wellness Visits do not include a physical exam. Some assessments may be limited, especially if the visit was conducted virtually. If needed, we may recommend a separate in-person follow-up with your provider.  Ongoing Care Seeing your primary care provider every 3 to 6 months helps us  monitor your health and provide consistent, personalized care.   Referrals If a referral was made during today's visit and you haven't received any updates within two weeks, please contact the referred provider directly to check on the status.  Recommended Screenings:  Health Maintenance  Topic Date Due   DTaP/Tdap/Td vaccine (1 - Tdap) Never done   Flu Shot  06/20/2024   COVID-19 Vaccine (4 - 2025-26 season) 07/21/2024   Medicare Annual Wellness Visit  09/03/2025   Pneumococcal Vaccine for age over 35  Completed   DEXA scan (bone density measurement)  Completed   Zoster (Shingles) Vaccine  Completed   Meningitis B Vaccine  Aged Out       09/03/2024   11:33 AM  Advanced Directives  Does Patient Have a Medical Advance Directive? Yes  Type of Estate agent of [place];Living will  Copy of Healthcare Power of Attorney in Chart? No - copy requested   Advance Care Planning is important because it: Ensures you receive medical care that aligns with your values, goals, and preferences. Provides guidance to your family and loved ones,  reducing the emotional burden of decision-making during critical moments.  Vision: Annual vision screenings are recommended for early detection of glaucoma, cataracts, and diabetic retinopathy. These exams can also reveal signs of chronic conditions such as diabetes and high blood pressure.  Dental: Annual dental screenings help detect early signs of oral cancer, gum disease, and other conditions linked to overall health, including heart disease and diabetes.  Please see the attached documents for additional preventive care recommendations.

## 2024-09-03 NOTE — Progress Notes (Signed)
 Subjective:   Sheila Wolf is a 89 y.o. who presents for a Medicare Wellness preventive visit.  As a reminder, Annual Wellness Visits don't include a physical exam, and some assessments may be limited, especially if this visit is performed virtually. We may recommend an in-person follow-up visit with your provider if needed.  Visit Complete: In person    Persons Participating in Visit: Patient.  AWV Questionnaire: No: Patient Medicare AWV questionnaire was not completed prior to this visit.  Cardiac Risk Factors include: advanced age (>15men, >43 women);hypertension     Objective:    Today's Vitals   09/03/24 1107  BP: 120/62  Pulse: 60  Temp: 98.5 F (36.9 C)  TempSrc: Oral  SpO2: 93%  Weight: 125 lb 3.2 oz (56.8 kg)  Height: 5' 2 (1.575 m)   Body mass index is 22.9 kg/m.     09/03/2024   11:33 AM 07/10/2022   10:35 AM 06/21/2021   10:37 AM 08/13/2018   10:42 PM 08/13/2018    4:37 PM  Advanced Directives  Does Patient Have a Medical Advance Directive? Yes Yes Yes No  No   Type of Estate agent of [place];Living will Healthcare Power of [place];Living will Healthcare Power of [place];Living will    Copy of Healthcare Power of Attorney in Chart? No - copy requested No - copy requested No - copy requested    Would patient like information on creating a medical advance directive?     No - Patient declined      Data saved with a previous flowsheet row definition    Current Medications (verified) Outpatient Encounter Medications as of 09/03/2024  Medication Sig   atorvastatin  (LIPITOR) 40 MG tablet TAKE 1 TABLET BY MOUTH EVERY DAY   butalbital -acetaminophen-caffeine  (FIORICET) 50-325-40 MG tablet TAKE 1 TABLET BY MOUTH 4 TIMES DAILY AS NEEDED FOR HEADACHE   cetirizine  (ZYRTEC ) 10 MG tablet TAKE 1 TABLET BY MOUTH EVERY DAY   cycloSPORINE (RESTASIS) 0.05 % ophthalmic emulsion 1 drop 2 (two) times daily.   escitalopram  (LEXAPRO ) 20  MG tablet TAKE 1 TABLET BY MOUTH ONCE DAILY   fluticasone  (FLONASE ) 50 MCG/ACT nasal spray Place 2 sprays into both nostrils daily.   Melatonin 5 MG CAPS Take by mouth.   meloxicam  (MOBIC ) 15 MG tablet TAKE 1 TABLET BY MOUTH EVERY DAY   methylPREDNISolone  (MEDROL  DOSEPAK) 4 MG TBPK tablet Take as directed per dosepak instructions   metoprolol  succinate (TOPROL -XL) 50 MG 24 hr tablet TAKE 1 TABLET BY MOUTH EVERY DAY   ondansetron  (ZOFRAN -ODT) 4 MG disintegrating tablet Take 1 tablet (4 mg total) by mouth every 8 (eight) hours as needed for nausea or vomiting.   No facility-administered encounter medications on file as of 09/03/2024.    Allergies (verified) Shellfish protein-containing drug products and Penicillins   History: 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HCC)    Urinary incontinence    Past Surgical History:  Procedure Laterality Date   APPENDECTOMY     BUNIONECTOMY     CERVICAL SPINE SURGERY     SHOULDER SURGERY     TONSILLECTOMY AND ADENOIDECTOMY     Family History  Problem Relation Age of Onset   Arthritis Mother    Hearing loss Mother    Heart disease Mother    Hypertension  Mother    Alcohol abuse Father    Early death Father    Heart disease Father    Stroke Father    Birth defects Brother    Heart disease Brother    Social History   Socioeconomic History   Marital status: Widowed    Spouse name: Not on file   Number of children: Not on file   Years of education: Not on file   Highest education level: Not on file  Occupational History   Not on file  Tobacco Use   Smoking status: Former    Types: Cigarettes   Smokeless tobacco: Never  Vaping Use   Vaping status: Never Used  Substance and Sexual Activity   Alcohol use: Yes    Alcohol/week: 10.0 standard drinks of alcohol    Types: 10  Glasses of wine per week   Drug use: No   Sexual activity: Not Currently  Other Topics Concern   Not on file  Social History Narrative   Not on file   Social Drivers of Health   Financial Resource Strain: Low Risk  (09/03/2024)   Overall Financial Resource Strain (CARDIA)    Difficulty of Paying Living Expenses: Not hard at all  Food Insecurity: No Food Insecurity (09/03/2024)   Hunger Vital Sign    Worried About Running Out of Food in the Last Year: Never true    Ran Out of Food in the Last Year: Never true  Transportation Needs: No Transportation Needs (09/03/2024)   PRAPARE - Administrator, Civil Service (Medical): No    Lack of Transportation (Non-Medical): No  Physical Activity: Insufficiently Active (09/03/2024)   Exercise Vital Sign    Days of Exercise per Week: 7 days    Minutes of Exercise per Session: 10 min  Stress: No Stress Concern Present (09/03/2024)   [employer] of Occupational Health - Occupational Stress Questionnaire    Feeling of Stress: Not at all  Social Connections: Moderately Integrated (09/03/2024)   Social Connection and Isolation Panel    Frequency of Communication with Friends and Family: More than three times a week    Frequency of Social Gatherings with Friends and Family: More than three times a week    Attends Religious Services: More than 4 times per year    Active Member of [employer] or Organizations: Yes    Attends Banker Meetings: More than 4 times per year    Marital Status: Widowed    Tobacco Counseling Counseling given: Not Answered    Clinical Intake:  Pre-visit preparation completed: Yes  Pain : No/denies pain     BMI - recorded: 22.9 Nutritional Status: BMI of 19-24  Normal Nutritional Risks: None Diabetes: No  No results found for: HGBA1C   How often do you need to have someone help you when you read instructions, pamphlets, or other written materials from your doctor or pharmacy?: 1 -  Never  Interpreter Needed?: No  Information entered by :: Rojelio Blush LPN   Activities of Daily Living     09/03/2024   11:32 AM  In your present state of health, do you have any difficulty performing the following activities:  Hearing? 0  Vision? 0  Difficulty concentrating or making decisions? 0  Walking or climbing stairs? 0  Dressing or bathing? 0  Doing errands, shopping? 0  Preparing Food and eating ? N  Using the Toilet? N  In the past six months, have you accidently leaked urine? Y  Comment Wears a Pads. Followed by PCP  Do you have problems with loss of bowel control? N  Managing your Medications? N  Managing your Finances? N  Housekeeping or managing your Housekeeping? N    Patient Care Team: Merna Huxley, NP as PCP - General (Family Medicine) Ernie Cough, MD as Consulting Physician (Orthopedic Surgery)  I have updated your Care Teams any recent Medical Services you may have received from other providers in the past year.     Assessment:   This is a routine wellness examination for [place].  Hearing/Vision screen Hearing Screening - Comments:: Denies hearing difficulties   Vision Screening - Comments:: Wears rx glasses - up to date with routine eye exams with  Dr Charmayne   Goals Addressed               This Visit's Progress     Increase physical activity (pt-stated)        Stay active.       Depression Screen     09/03/2024   11:31 AM 07/09/2024    1:35 PM 07/12/2023    2:21 PM 07/10/2022   10:37 AM 06/26/2022    2:42 PM 02/07/2022    2:26 PM 06/21/2021   10:37 AM  PHQ 2/9 Scores  PHQ - 2 Score 0 0 0 0 0 0 0  PHQ- 9 Score 0 0 0  0 0     Fall Risk     09/03/2024   11:33 AM 07/12/2023    2:21 PM 07/10/2022   10:36 AM 06/26/2022    2:42 PM 02/07/2022    2:26 PM  Fall Risk   Falls in the past year? 1 0 0 0 1  Number falls in past yr: 0 0 0 0 0  Injury with Fall? 0 0 0 0 0  Risk for fall due to : No Fall Risks No Fall Risks Medication  side effect No Fall Risks No Fall Risks  Follow up Falls evaluation completed Falls evaluation completed Falls evaluation completed;Education provided;Falls prevention discussed  Falls evaluation completed  Falls evaluation completed      Data saved with a previous flowsheet row definition    MEDICARE RISK AT HOME:  Medicare Risk at Home Any stairs in or around the home?: No If so, are there any without handrails?: No Home free of loose throw rugs in walkways, pet beds, electrical cords, etc?: Yes Adequate lighting in your home to reduce risk of falls?: Yes Life alert?: No Use of a cane, walker or w/c?: No Grab bars in the bathroom?: Yes Shower chair or bench in shower?: Yes Elevated toilet seat or a handicapped toilet?: Yes  TIMED UP AND GO:  Was the test performed?  Yes  Length of time to ambulate 10 feet: 10 sec Gait slow and steady without use of assistive device  Cognitive Function: 6CIT completed    08/12/2024   11:58 AM 05/31/2023   11:59 AM 11/09/2022   11:15 AM  MMSE - Mini Mental State Exam  Orientation to time 5 5 5   Orientation to Place 5 5 5   Registration 3 3 3   Attention/ Calculation 5 5 5   Recall 2 3 3   Language- name 2 objects 2 2 2   Language- repeat 1 1 1   Language- follow 3 step command 3 3 3   Language- read & follow direction 1 1 1   Write a sentence 1 1 1   Copy design 1 1 1   Total score 29 30 30  09/03/2024   11:34 AM 07/10/2022   10:39 AM  6CIT Screen  What Year? 0 points 0 points  What month? 0 points 0 points  What time? 0 points 0 points  Count back from 20 0 points 0 points  Months in reverse 0 points 4 points  Repeat phrase 0 points 10 points  Total Score 0 points 14 points    Immunizations Immunization History  Administered Date(s) Administered   Fluad Quad(high Dose 65+) 08/23/2019, 08/02/2020   INFLUENZA, HIGH DOSE SEASONAL PF 09/04/2017, 08/23/2018, 08/22/2021   Influenza-Unspecified 08/25/2022   PFIZER(Purple  Top)SARS-COV-2 Vaccination 12/11/2019, 01/01/2020, 08/25/2020   Pneumococcal Conjugate-13 07/29/2014, 11/30/2018   Pneumococcal Polysaccharide-23 03/20/2012, 11/29/2018   Zoster Recombinant(Shingrix) 10/06/2017, 01/07/2018    Screening Tests Health Maintenance  Topic Date Due   DTaP/Tdap/Td (1 - Tdap) Never done   Influenza Vaccine  06/20/2024   COVID-19 Vaccine (4 - 2025-26 season) 07/21/2024   Medicare Annual Wellness (AWV)  09/03/2025   Pneumococcal Vaccine: 50+ Years  Completed   DEXA SCAN  Completed   Zoster Vaccines- Shingrix  Completed   Meningococcal B Vaccine  Aged Out    Health Maintenance Items Addressed:   Additional Screening:  Vision Screening: Recommended annual ophthalmology exams for early detection of glaucoma and other disorders of the eye. Is the patient up to date with their annual eye exam?  Yes  Who is the provider or what is the name of the office in which the patient attends annual eye exams? Dr Charmayne  Dental Screening: Recommended annual dental exams for proper oral hygiene  Community Resource Referral / Chronic Care Management: CRR required this visit?  No   CCM required this visit?  No   Plan:    I have personally reviewed and noted the following in the patient's chart:   Medical and social history Use of alcohol, tobacco or illicit drugs  Current medications and supplements including opioid prescriptions. Patient is not currently taking opioid prescriptions. Functional ability and status Nutritional status Physical activity Advanced directives List of other physicians Hospitalizations, surgeries, and ER visits in previous 12 months Vitals Screenings to include cognitive, depression, and falls Referrals and appointments  In addition, I have reviewed and discussed with patient certain preventive protocols, quality metrics, and best practice recommendations. A written personalized care plan for preventive services as well as general  preventive health recommendations were provided to patient.   Rojelio LELON Blush, LPN   [id number]   After Visit Summary: (In Person-Printed) AVS printed and given to the patient  Notes: Nothing significant to report at this time.

## 2024-09-08 ENCOUNTER — Ambulatory Visit (HOSPITAL_COMMUNITY)
Admission: RE | Admit: 2024-09-08 | Discharge: 2024-09-08 | Disposition: A | Discharge status: Home / Self Care | Source: Ambulatory Visit

## 2024-09-08 DIAGNOSIS — H900 Conductive hearing loss, bilateral: Secondary | ICD-10-CM | POA: Diagnosis not present

## 2024-09-08 DIAGNOSIS — M26643 Arthritis of bilateral temporomandibular joint: Secondary | ICD-10-CM | POA: Diagnosis not present

## 2024-09-09 DIAGNOSIS — L814 Other melanin hyperpigmentation: Secondary | ICD-10-CM | POA: Diagnosis not present

## 2024-09-09 DIAGNOSIS — L821 Other seborrheic keratosis: Secondary | ICD-10-CM | POA: Diagnosis not present

## 2024-09-09 DIAGNOSIS — L820 Inflamed seborrheic keratosis: Secondary | ICD-10-CM | POA: Diagnosis not present

## 2024-09-16 ENCOUNTER — Other Ambulatory Visit: Payer: Self-pay | Admitting: Adult Health

## 2024-09-16 DIAGNOSIS — F419 Anxiety disorder, unspecified: Secondary | ICD-10-CM

## 2024-09-17 ENCOUNTER — Other Ambulatory Visit: Payer: Self-pay | Admitting: Family Medicine

## 2024-09-17 ENCOUNTER — Other Ambulatory Visit: Payer: Self-pay | Admitting: Adult Health

## 2024-09-25 DIAGNOSIS — H900 Conductive hearing loss, bilateral: Secondary | ICD-10-CM | POA: Diagnosis not present

## 2024-10-01 ENCOUNTER — Ambulatory Visit (INDEPENDENT_AMBULATORY_CARE_PROVIDER_SITE_OTHER)

## 2024-10-01 VITALS — BP 179/72 | HR 68 | Temp 98.0°F | Wt 135.0 lb

## 2024-10-01 DIAGNOSIS — H900 Conductive hearing loss, bilateral: Secondary | ICD-10-CM

## 2024-10-01 NOTE — Progress Notes (Signed)
 HPI:   08/27/2024 Sheila Wolf is a 89 y.o. female who presents as a new patient in consultation for hearing loss. Patient states that in August of this year she presented to her PCP and had her ears flushed. After this she felt as though her right ear had diminished hearing. She denies any history of asymmetry in her hearing prior to this. She states that she did have an audiogram done at [facility] which showed bilateral conductive hearing loss. Patient states that she feels that her hearing in her right ear feels like she is in a tunnel. She was unaware of her hearing loss in her left ear prior to the audiogram. She denies any recurrent ear infections. Denies any pain or drainage from the ears. Denies any dizziness or imbalance. Denies any head trauma.   Today (10/01/2024) Patient states that she is feeling much better in regards to her ears. The feeling of being in a tunnel is gone. She denies any other complaints at this time.    PMH/Meds/All/SocHx/FamHx/ROS: 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HCC)    Urinary incontinence    Past Surgical History:  Procedure Laterality Date   APPENDECTOMY     BUNIONECTOMY     CERVICAL SPINE SURGERY     SHOULDER SURGERY     TONSILLECTOMY AND ADENOIDECTOMY     No family history of bleeding disorders, wound healing problems or difficulty with anesthesia.  Social Connections: Moderately Integrated (09/03/2024)   Social Connection and Isolation Panel    Frequency of Communication with Friends and Family: More than three times a week    Frequency of Social Gatherings with Friends and Family: More than three times a week    Attends Religious Services: More than 4 times per year    Active Member of [employer] or Organizations: Yes    Attends Banker Meetings: More than 4 times  per year    Marital Status: Widowed    Current Outpatient Medications:    atorvastatin  (LIPITOR) 40 MG tablet, TAKE 1 TABLET BY MOUTH EVERY DAY, Disp: 90 tablet, Rfl: 0   butalbital -acetaminophen-caffeine  (FIORICET) 50-325-40 MG tablet, TAKE 1 TABLET BY MOUTH 4 TIMES DAILY AS NEEDED FOR HEADACHE, Disp: 60 tablet, Rfl: 2   cetirizine  (ZYRTEC ) 10 MG tablet, TAKE 1 TABLET BY MOUTH EVERY DAY, Disp: 90 tablet, Rfl: 3   cycloSPORINE (RESTASIS) 0.05 % ophthalmic emulsion, 1 drop 2 (two) times daily., Disp: , Rfl:    escitalopram  (LEXAPRO ) 20 MG tablet, TAKE 1 TABLET BY MOUTH ONCE DAILY, Disp: 90 tablet, Rfl: 1   fluticasone  (FLONASE ) 50 MCG/ACT nasal spray, Place 2 sprays into both nostrils daily., Disp: 16 g, Rfl: 6   Melatonin 5 MG CAPS, Take by mouth., Disp: , Rfl:    meloxicam  (MOBIC ) 15 MG tablet, TAKE 1 TABLET BY MOUTH EVERY DAY, Disp: 90 tablet, Rfl: 3   methylPREDNISolone  (MEDROL  DOSEPAK) 4 MG TBPK tablet, Take as directed per dosepak instructions, Disp: 21 each, Rfl: 0   metoprolol  succinate (TOPROL -XL) 50 MG 24 hr tablet, TAKE 1 TABLET BY MOUTH EVERY DAY, Disp: 90 tablet, Rfl: 3   ondansetron  (ZOFRAN -ODT) 4 MG disintegrating tablet, Take 1 tablet (4 mg total) by mouth every 8 (eight) hours as needed for nausea or vomiting., Disp: 20 tablet, Rfl: 0 A complete ROS  was performed with pertinent positives/negatives noted in the HPI. The remainder of the ROS are negative.   Physical Exam:  BP (!) 179/72 (BP Location: Left Arm, Patient Position: Sitting, Cuff Size: Normal) Comment: Pt has white coat and in pain from a fall  Pulse 68   Temp 98 F (36.7 C)   Wt 135 lb (61.2 kg)   SpO2 96%   BMI 24.69 kg/m  General: Well developed, well nourished. No acute distress.  Head/Face: Normocephalic. No sinus tenderness. Facial nerve intact and equal bilaterally. No facial lacerations. Eyes: PERRL, no scleral icterus or conjunctival hemorrhage. EOMI. Ears: No gross deformity. Normal external canal.  Tympanic membrane in tact bilaterally. Right TM retracted Hearing: Normal speech reception.  Nose: No gross deformity or lesions. No purulent discharge. No turbinate hypertrophy. Mouth/Oropharynx: Lips without any lesions. Dentition fair. No mucosal lesions within the oropharynx. No tonsillar enlargement, exudate, or lesions. Pharyngeal walls symmetrical. Uvula midline. Tongue midline without lesions. Neck: Trachea midline. No masses. No thyromegaly or nodules palpated. No crepitus. Lymphatic: No lymphadenopathy in the neck. Respiratory: No stridor or distress. Room air. Cardiovascular: Regular rate and rhythm. Extremities: No edema or cyanosis. Warm and well-perfused. Skin: No scars or lesions on face or neck. Neurologic: CN II-XII grossly intact. Moving all extremities without gross abnormality. Other:  Independent Review of Additional Tests or Records: Audiogram 08/15/2024 - Right ear - normal to severe conductive hearing loss with A(d) tympanogram - Left ear - mild to severe conductive hearing loss with Type B tympanogram  Audiogram 09/25/2024 - Normal to severe conductive sloping hearing loss bilaterally - Type C tympanogram on left  CT temporal bone 09/08/2024   Procedures: None Impression & Plans: Sheila Wolf is a 89 y.o. female with bilateral conductive hearing loss.   Bilateral conductive hearing loss - Continue flonase  daily - CT temporal bone reviewed - Repeat audiogram reviewed  Follow-up as needed.   Adah Malkin, DO [place] [facility]

## 2024-10-03 ENCOUNTER — Other Ambulatory Visit: Payer: Self-pay | Admitting: Adult Health

## 2024-10-03 DIAGNOSIS — R519 Headache, unspecified: Secondary | ICD-10-CM

## 2024-10-03 NOTE — Telephone Encounter (Signed)
 Okay for refill?

## 2024-10-07 ENCOUNTER — Ambulatory Visit: Payer: Self-pay | Admitting: *Deleted

## 2024-10-07 NOTE — Telephone Encounter (Signed)
 Fall denies hitting head, slid off toilet seat in shower on left knee.  FYI Only or Action Required?: FYI only for provider: appointment scheduled on 10/08/24.  Patient was last seen in primary care on 08/12/2024 by Merna Huxley, NP.  Called Nurse Triage reporting Fall.  Symptoms began several days ago.  Interventions attempted: OTC medications: cool compress tylenol.  Symptoms are: gradually worsening.  Triage Disposition: See PCP When Office is Open (Within 3 Days)  Patient/caregiver understands and will follow disposition?: Yes      Copied from CRM #[id number]. Topic: Clinical - Red Word Triage >> Oct 07, 2024  9:59 AM Antwanette L wrote: Red Word that prompted transfer to Nurse Triage: The patient reports a fall earlier this week and is now experiencing soreness, swelling, and tenderness in her left knee. Reason for Disposition  MILD weakness (e.g., does not interfere with ability to work, go to school, normal activities)  (Exception: Mild weakness is a chronic symptom.)  Answer Assessment - Initial Assessment Questions Appt scheduled for tomorrow with PCP.      1. MECHANISM: How did the fall happen?     Slid off of toilet seat and fell in shower  2. DOMESTIC VIOLENCE AND ELDER ABUSE SCREENING: Did you fall because someone pushed you or tried to hurt you? If Yes, ask: Are you safe now?     na 3. ONSET: When did the fall happen? (e.g., minutes, hours, or days ago)     Before the weekend 4. LOCATION: What part of the body hit the ground? (e.g., back, buttocks, head, hips, knees, hands, head, stomach)     Left knee 5. INJURY: Did you hurt (injure) yourself when you fell? If Yes, ask: What did you injure? Tell me more about this? (e.g., body area; type of injury; pain severity)     Not sure left knee soreness , swelling and bruised 6. PAIN: Is there any pain? If Yes, ask: How bad is the pain? (e.g., Scale 0-10; or none, mild,      Moderate can walk on  left leg 7. SIZE: For cuts, bruises, or swelling, ask: How large is it? (e.g., inches or centimeters)      Bruises, swelling  8. PREGNANCY: Is there any chance you are pregnant? When was your last menstrual period?     na 9. OTHER SYMPTOMS: Do you have any other symptoms? (e.g., dizziness, fever, weakness; new-onset or worsening).      Left knee tenderness, swelling, soreness. Denies hitting head or any other area 10. CAUSE: What do you think caused the fall (or falling)? (e.g., dizzy spell, tripped)       Slid of loose toilet seat  Protocols used: Falls and [facility]

## 2024-10-08 ENCOUNTER — Encounter: Payer: Self-pay | Admitting: Adult Health

## 2024-10-08 ENCOUNTER — Ambulatory Visit

## 2024-10-08 ENCOUNTER — Ambulatory Visit (INDEPENDENT_AMBULATORY_CARE_PROVIDER_SITE_OTHER): Admitting: Adult Health

## 2024-10-08 VITALS — BP 120/60 | HR 75 | Temp 97.7°F | Ht 62.0 in | Wt 132.0 lb

## 2024-10-08 DIAGNOSIS — M25562 Pain in left knee: Secondary | ICD-10-CM

## 2024-10-08 DIAGNOSIS — M1712 Unilateral primary osteoarthritis, left knee: Secondary | ICD-10-CM | POA: Diagnosis not present

## 2024-10-08 DIAGNOSIS — M25462 Effusion, left knee: Secondary | ICD-10-CM | POA: Diagnosis not present

## 2024-10-08 NOTE — Progress Notes (Signed)
 Subjective:    Patient ID: Sheila Wolf, female    DOB: 11-03-1934, 89 y.o.   MRN: [id number]  HPI  Discussed the use of AI scribe software for clinical note transcription with the patient, who gave verbal consent to proceed.  History of Present Illness   Sheila Wolf is an 89 year old female who presents with left knee pain following a fall.  Two weeks ago, she fell while sitting on a wobbly toilet seat, leading to a fall into the shower. This resulted in pain localized to her left knee, described as a bruise below the knee, tender to touch. There is no clicking, popping, or locking of the knee. She uses ice for pain management, which provides temporary relief, and takes Tylenol for pain. She has not used Motrin or Aleve. There is tenderness in the left knee and decreased swelling.       Review of Systems See HPI   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HCC)    Urinary incontinence     Social History   Socioeconomic History   Marital status: Widowed    Spouse name: Not on file   Number of children: Not on file   Years of education: Not on file   Highest education level: Not on file  Occupational History   Not on file  Tobacco Use   Smoking status: Former    Types: Cigarettes   Smokeless tobacco: Never  Vaping Use   Vaping status: Never Used  Substance and Sexual Activity   Alcohol use: Yes    Alcohol/week: 10.0 standard drinks of alcohol    Types: 10 Glasses of wine per week   Drug use: No   Sexual activity: Not Currently  Other Topics Concern   Not on file  Social History Narrative   Not on file   Social Drivers of Health   Financial Resource Strain: Low Risk  (09/03/2024)   Overall Financial Resource Strain (CARDIA)    Difficulty of Paying Living Expenses: Not  hard at all  Food Insecurity: No Food Insecurity (09/03/2024)   Hunger Vital Sign    Worried About Running Out of Food in the Last Year: Never true    Ran Out of Food in the Last Year: Never true  Transportation Needs: No Transportation Needs (09/03/2024)   PRAPARE - Administrator, Civil Service (Medical): No    Lack of Transportation (Non-Medical): No  Physical Activity: Insufficiently Active (09/03/2024)   Exercise Vital Sign    Days of Exercise per Week: 7 days    Minutes of Exercise per Session: 10 min  Stress: No Stress Concern Present (09/03/2024)   [employer] of Occupational Health - Occupational Stress Questionnaire    Feeling of Stress: Not at all  Social Connections: Moderately Integrated (09/03/2024)   Social Connection and Isolation Panel    Frequency of Communication with Friends and Family: More than three times a week    Frequency of Social Gatherings with Friends and Family: More than three times a week    Attends Religious Services: More than 4 times per year    Active Member of [employer] or Organizations: Yes    Attends Banker Meetings: More than 4 times per year    Marital Status: Widowed  Intimate Partner Violence: Not At Risk (09/03/2024)   Humiliation, Afraid, Rape, and Kick questionnaire    Fear of Current or Ex-Partner: No    Emotionally Abused: No    Physically Abused: No    Sexually Abused: No    Past Surgical History:  Procedure Laterality Date   APPENDECTOMY     BUNIONECTOMY     CERVICAL SPINE SURGERY     SHOULDER SURGERY     TONSILLECTOMY AND ADENOIDECTOMY      Family History  Problem Relation Age of Onset   Arthritis Mother    Hearing loss Mother    Heart disease Mother    Hypertension Mother    Alcohol abuse Father    Early death Father    Heart disease Father    Stroke Father    Birth defects Brother    Heart disease Brother     Allergies  Allergen Reactions   Shellfish Protein-Containing Drug  Products     Makes her stomach hurt    Penicillins Rash    Current Outpatient Medications on File Prior to Visit  Medication Sig Dispense Refill   atorvastatin  (LIPITOR) 40 MG tablet TAKE 1 TABLET BY MOUTH EVERY DAY 90 tablet 0   butalbital -acetaminophen-caffeine  (FIORICET) 50-325-40 MG tablet TAKE 1 TABLET BY MOUTH 4 TIMES DAILY AS NEEDED FOR HEADACHE 60 tablet 2   cetirizine  (ZYRTEC ) 10 MG tablet TAKE 1 TABLET BY MOUTH EVERY DAY 90 tablet 3   cycloSPORINE (RESTASIS) 0.05 % ophthalmic emulsion 1 drop 2 (two) times daily.     escitalopram  (LEXAPRO ) 20 MG tablet TAKE 1 TABLET BY MOUTH ONCE DAILY 90 tablet 1   fluticasone  (FLONASE ) 50 MCG/ACT nasal spray Place 2 sprays into both nostrils daily. 16 g 6   Melatonin 5 MG CAPS Take by mouth.     meloxicam  (MOBIC ) 15 MG tablet TAKE 1 TABLET BY MOUTH EVERY DAY 90 tablet 3   methylPREDNISolone  (MEDROL  DOSEPAK) 4 MG TBPK tablet Take as directed per dosepak instructions 21 each 0   metoprolol  succinate (TOPROL -XL) 50 MG 24 hr tablet TAKE 1 TABLET BY MOUTH EVERY DAY 90 tablet 3   ondansetron  (ZOFRAN -ODT) 4 MG disintegrating tablet Take 1 tablet (4 mg total) by mouth every 8 (eight) hours as needed for nausea or vomiting. 20 tablet 0   No current facility-administered medications on file prior to visit.    BP 120/60   Pulse 75   Temp 97.7 F (36.5 C) (Oral)   Ht 5' 2 (1.575 m)   Wt 132 lb (59.9 kg)   SpO2 97%   BMI 24.14 kg/m       Objective:   Physical Exam Vitals and nursing note reviewed.  Constitutional:      Appearance: Normal appearance.  Musculoskeletal:     Left knee: Bony tenderness present. No swelling, deformity, effusion, erythema or crepitus. Normal range of motion. Tenderness present over the patellar tendon. No LCL laxity, MCL laxity, ACL laxity or PCL laxity.Normal alignment, normal meniscus and normal patellar mobility.  Neurological:     General: No focal deficit present.     Mental Status: She is alert and oriented  to person, place, and time.  Psychiatric:        Mood and Affect: Mood normal.        Behavior: Behavior normal.        Thought Content: Thought content normal.        Judgment: Judgment normal.        Assessment &  Plan:  Discussed the use of AI scribe software for clinical note transcription with the patient, who gave verbal consent to proceed.  Assessment and Plan    Acute left knee pain Pain localized to lower patella and patella tendon no swelling, clicking, popping, or locking.  - Likely soft tissue injury but will get xray since it has bene two weeks.  - Can consider advanced imaging.  - Ordered x-ray of left knee. - Continue ice for pain management. - Use Tylenol and Motrin as needed for pain.       Emaya Preston, NP  I personally spent a total of 30 minutes in the care of the patient today including preparing to see the patient, getting/reviewing separately obtained history, performing a medically appropriate exam/evaluation, counseling and educating, placing orders, documenting clinical information in the EHR, and listening .

## 2024-10-08 NOTE — Patient Instructions (Addendum)
 It was great seeing you today.   I do not think you  fractured your knee and likely the pain is from bruising but we will do an xray today to make sure. Once I get that xray back I will let you know what it shows.   In the meantime ice it and take Tylenol or Motrin

## 2024-10-14 ENCOUNTER — Ambulatory Visit: Payer: Self-pay | Admitting: Adult Health

## 2024-10-31 ENCOUNTER — Other Ambulatory Visit: Payer: Self-pay | Admitting: Adult Health

## 2024-10-31 ENCOUNTER — Ambulatory Visit: Payer: Self-pay

## 2024-10-31 DIAGNOSIS — R110 Nausea: Secondary | ICD-10-CM

## 2024-10-31 DIAGNOSIS — R519 Headache, unspecified: Secondary | ICD-10-CM

## 2024-10-31 NOTE — Telephone Encounter (Unsigned)
 Copied from CRM #[id number]. Topic: Clinical - Medication Refill >> Oct 31, 2024  3:03 PM Shardie S wrote: Medication: ondansetron  (ZOFRAN -ODT) 4 MG disintegrating tablet  Has the patient contacted their pharmacy? Yes (Agent: If no, request that the patient contact the pharmacy for the refill. If patient does not wish to contact the pharmacy document the reason why and proceed with request.) (Agent: If yes, when and what did the pharmacy advise?)  This is the patient's preferred pharmacy:  [facility] [place], [place] - [address] [place] [address] Phone: [phone] Fax: [phone]  Is this the correct pharmacy for this prescription? Yes If no, delete pharmacy and type the correct one.   Has the prescription been filled recently? No  Is the patient out of the medication? Yes  Has the patient been seen for an appointment in the last year OR does the patient have an upcoming appointment? Yes  Can we respond through MyChart? No  Agent: Please be advised that Rx refills may take up to 3 business days. We ask that you follow-up with your pharmacy.

## 2024-10-31 NOTE — Telephone Encounter (Unsigned)
 Copied from CRM #[id number]. Topic: Clinical - Medication Refill >> Oct 31, 2024  3:36 PM Suzen RAMAN wrote: Medication: butalbital -acetaminophen-caffeine  (FIORICET) 50-325-40 MG tablet   Has the patient contacted their pharmacy? Yes   This is the patient's preferred pharmacy:  [facility] - [place], [place] - [address] [place] [address] Phone: [phone] Fax: [phone]  Is this the correct pharmacy for this prescription? Yes If no, delete pharmacy and type the correct one.   Has the prescription been filled recently? No  Is the patient out of the medication? Yes  Has the patient been seen for an appointment in the last year OR does the patient have an upcoming appointment? Yes  Can we respond through MyChart? Yes  Agent: Please be advised that Rx refills may take up to 3 business days. We ask that you follow-up with your pharmacy.

## 2024-10-31 NOTE — Telephone Encounter (Signed)
 FYI Only or Action Required?: Action required by provider: refill request. And update on patient condition Patient was last seen in primary care on 10/08/2024 by Merna Huxley, NP.  Called Nurse Triage reporting Vomiting.  Symptoms began yesterday.  Interventions attempted: Rest, hydration, or home remedies and Dietary changes.  Symptoms are: gradually improving.  Triage Disposition: Home Care  Patient/caregiver understands and will follow disposition?: Yes Message from [facility] S sent at 10/31/2024  3:05 PM EST  Reason for Triage: vomiting   Reason for Disposition  MILD to MODERATE vomiting (e.g., 1 - 5 times / day)  Answer Assessment - Initial Assessment Questions 1. VOMITING SEVERITY: How many times have you vomited in the past 24 hours?      Several times today 2. ONSET: When did the vomiting begin?      One day ago 3. FLUIDS: What fluids or food have you vomited up today? Have you been able to keep any fluids down?     Now dry heaving.  Has not taken anything by mouth today 4. ABDOMEN PAIN: Are your having any abdomen pain? If Yes : How bad is it and what does it feel like? (e.g., crampy, dull, intermittent, constant)      denies 5. DIARRHEA: Is there any diarrhea? If Yes, ask: How many times today?      denies 6. CONTACTS: Is there anyone else in the family with the same symptoms?      N/a 7. CAUSE: What do you think is causing your vomiting?     Maybe at something at a party yesterday 8. HYDRATION STATUS: Any signs of dehydration? (e.g., dry mouth [not only dry lips], too weak to stand) When did you last urinate?     today  Protocols used: Vomiting-A-AH

## 2024-10-31 NOTE — Telephone Encounter (Signed)
 1st attempt, no answer. Phone line goes to a engineer, technical sales for [employer] ([employer]). Left message requesting for call back for nurse triage.    Message from [place] S sent at 10/31/2024  3:05 PM EST  Reason for Triage: vomiting

## 2024-11-04 MED ORDER — BUTALBITAL-APAP-CAFFEINE 50-325-40 MG PO TABS: ORAL_TABLET | ORAL | 2 refills | Status: AC

## 2024-11-04 NOTE — Telephone Encounter (Signed)
 Spoke to pt daughter and stated that pt is feeling better and will come in for flu shot.

## 2024-11-05 ENCOUNTER — Ambulatory Visit

## 2024-11-05 DIAGNOSIS — Z23 Encounter for immunization: Secondary | ICD-10-CM

## 2024-11-26 ENCOUNTER — Other Ambulatory Visit: Payer: Self-pay | Admitting: Adult Health

## 2024-11-26 DIAGNOSIS — I10 Essential (primary) hypertension: Secondary | ICD-10-CM

## 2024-11-26 DIAGNOSIS — M150 Primary generalized (osteo)arthritis: Secondary | ICD-10-CM

## 2024-12-10 ENCOUNTER — Ambulatory Visit: Payer: Self-pay

## 2024-12-10 ENCOUNTER — Ambulatory Visit: Payer: Self-pay | Admitting: Adult Health

## 2024-12-10 ENCOUNTER — Ambulatory Visit: Admitting: Adult Health

## 2024-12-10 ENCOUNTER — Encounter: Payer: Self-pay | Admitting: Adult Health

## 2024-12-10 VITALS — BP 130/80 | HR 92 | Temp 97.9°F | Ht 62.0 in | Wt 125.0 lb

## 2024-12-10 DIAGNOSIS — R112 Nausea with vomiting, unspecified: Secondary | ICD-10-CM

## 2024-12-10 LAB — COMPREHENSIVE METABOLIC PANEL WITH GFR
ALT: 21 U/L (ref 3–35)
AST: 31 U/L (ref 5–37)
Albumin: 4.4 g/dL (ref 3.5–5.2)
Alkaline Phosphatase: 74 U/L (ref 39–117)
BUN: 14 mg/dL (ref 6–23)
CO2: 29 meq/L (ref 19–32)
Calcium: 9.6 mg/dL (ref 8.4–10.5)
Chloride: 101 meq/L (ref 96–112)
Creatinine, Ser: 0.87 mg/dL (ref 0.40–1.20)
GFR: 59.44 mL/min — ABNORMAL LOW
Glucose, Bld: 111 mg/dL — ABNORMAL HIGH (ref 70–99)
Potassium: 4.1 meq/L (ref 3.5–5.1)
Sodium: 139 meq/L (ref 135–145)
Total Bilirubin: 0.6 mg/dL (ref 0.2–1.2)
Total Protein: 7.6 g/dL (ref 6.0–8.3)

## 2024-12-10 LAB — URINALYSIS
Bilirubin Urine: NEGATIVE
Hgb urine dipstick: NEGATIVE
Ketones, ur: 15 — AB
Leukocytes,Ua: NEGATIVE
Nitrite: NEGATIVE
Specific Gravity, Urine: 1.025 (ref 1.000–1.030)
Total Protein, Urine: NEGATIVE
Urine Glucose: NEGATIVE
Urobilinogen, UA: 0.2 (ref 0.0–1.0)
pH: 6 (ref 5.0–8.0)

## 2024-12-10 LAB — CBC
HCT: 41.3 % (ref 36.0–46.0)
Hemoglobin: 13.7 g/dL (ref 12.0–15.0)
MCHC: 33.1 g/dL (ref 30.0–36.0)
MCV: 95.5 fl (ref 78.0–100.0)
Platelets: 125 K/uL — ABNORMAL LOW (ref 150.0–400.0)
RBC: 4.32 Mil/uL (ref 3.87–5.11)
RDW: 14 % (ref 11.5–15.5)
WBC: 6.4 K/uL (ref 4.0–10.5)

## 2024-12-10 MED ORDER — METOCLOPRAMIDE HCL 5 MG PO TABS: 5.0000 mg | ORAL_TABLET | Freq: Three times a day (TID) | ORAL | 0 refills | Status: AC | PRN

## 2024-12-10 NOTE — Telephone Encounter (Signed)
 FYI Only or Action Required?: FYI only for provider: appointment scheduled on 1/21.  Patient was last seen in primary care on 10/08/2024 by Merna Huxley, NP.  Called Nurse Triage reporting Nausea and Vomiting.  Symptoms began several days ago.  Interventions attempted: Prescription medications: zofran .  Symptoms are: gradually worsening.  Triage Disposition: See HCP Within 4 Hours (Or PCP Triage)  Patient/caregiver understands and will follow disposition?: Yes  Message from [facility] T sent at 12/10/2024  8:36 AM EST  Summary: Worsening Nausea, Vomiting   Reason for Triage: Pt states she has been worsening vomitng, nausea. Has been ongoing for a few days, but is not going away.  Pt requesting an appt for evaluation         Reason for Disposition  [1] Vomiting AND [2] abdomen looks much more swollen than usual  Answer Assessment - Initial Assessment Questions For the last few days- patient with nausea, vomiting, and occasional loose stool. Has been able to keep down sips of gingerale but otherwise throwing up everything else. 2-3times a day. Green bile/phlegm. Has zofran  from previous episodes like this but unsure how much it is actually helping.   Appt today with PCP to assess. Patient understands ED/UC precautions    1. VOMITING SEVERITY: How many times have you vomited in the past 24 hours?      2-3times day  2. ONSET: When did the vomiting begin?      Few days  3. FLUIDS: What fluids or food have you vomited up today? Have you been able to keep any fluids down?     Sipping on Gingerale  4. ABDOMEN PAIN: Are your having any abdomen pain? If Yes : How bad is it and what does it feel like? (e.g., crampy, dull, intermittent, constant)      Denies pain or cramping  5. DIARRHEA: Is there any diarrhea? If Yes, ask: How many times today?      Loose  6. CONTACTS: Is there anyone else in the family with the same symptoms?      Not that she knows of but lives at  [facility] 7. CAUSE: What do you think is causing your vomiting?     Unsure  8. HYDRATION STATUS: Any signs of dehydration? (e.g., dry mouth [not only dry lips], too weak to stand) When did you last urinate?     Dry mouth  9. OTHER SYMPTOMS: Do you have any other symptoms? (e.g., fever, headache, vertigo, vomiting blood or coffee grounds, recent head injury)     Slight headache, dry mouth. Denies bloody stool or emesis  Protocols used: Vomiting-A-AH

## 2024-12-10 NOTE — Progress Notes (Signed)
 "  Acute Office Visit  Subjective:     Patient ID: Sheila Wolf, female    DOB: 06/23/1934, 89 y.o.   MRN: [id number]  Chief Complaint  Patient presents with   Nausea   Vomiting    HPI Patient is in today for nausea and vomiting for one week. She reports retching with occasional low volume clear fluid, decreased appetite, and mild abdominal cramping sensation in the left lower quadrant. She denies pain. Patient reports that she has taken ondansetron  with minimal relief. Her last dose of ondansetron  was at 0730 this morning after which she vomited; therefore, she did not experience any relief of her symptoms. She denies fever and chills. Has had several loose stools. She resides at an independent living facility. Denies sick contacts.   Review of Systems  Constitutional:  Positive for weight loss. Negative for chills and fever.  Cardiovascular:  Negative for chest pain.  Gastrointestinal:  Positive for nausea and vomiting. Negative for abdominal pain, blood in stool and melena.  Genitourinary:  Negative for dysuria and urgency.  Musculoskeletal:  Negative for joint pain and myalgias.  Neurological:  Positive for headaches. Negative for dizziness and weakness.    Wt Readings from Last 3 Encounters:  12/10/24 56.7 kg  10/08/24 59.9 kg  10/01/24 61.2 kg        Objective:    BP 130/80   Pulse 92   Temp 97.9 F (36.6 C) (Oral)   Ht 5' 2 (1.575 m)   Wt 56.7 kg   SpO2 97%   BMI 22.86 kg/m    Physical Exam Vitals reviewed.  Constitutional:      General: She is not in acute distress.    Appearance: Normal appearance. She is not ill-appearing.  HENT:     Head: Atraumatic.     Nose: Nose normal.     Mouth/Throat:     Mouth: Mucous membranes are moist.     Pharynx: Oropharynx is clear.  Eyes:     General: No scleral icterus.    Conjunctiva/sclera: Conjunctivae normal.     Pupils: Pupils are equal, round, and reactive to light.  Cardiovascular:     Rate  and Rhythm: Normal rate and regular rhythm.     Pulses: Normal pulses.     Heart sounds: Normal heart sounds.  Pulmonary:     Effort: Pulmonary effort is normal.     Breath sounds: Normal breath sounds.  Abdominal:     General: Bowel sounds are normal. There is no distension.     Palpations: Abdomen is soft. There is no mass.     Tenderness: There is no abdominal tenderness. There is no guarding or rebound.  Musculoskeletal:     Cervical back: Normal range of motion and neck supple.  Lymphadenopathy:     Cervical: No cervical adenopathy.  Skin:    General: Skin is warm and dry.     Capillary Refill: Capillary refill takes less than 2 seconds.  Neurological:     Mental Status: She is alert and oriented to person, place, and time.  Psychiatric:        Mood and Affect: Mood normal.        Behavior: Behavior normal.       Assessment & Plan:   1. Nausea and vomiting, unspecified vomiting type (Primary) - metoCLOPramide  (REGLAN ) 5 MG tablet; Take 1 tablet (5 mg total) by mouth every 8 (eight) hours as needed for nausea.  Dispense: 10 tablet; Refill: 0  -  Likely gastroenteritis. No red flags noted. Does not appear acutely ill. - Encouraged patient to return to the office early next week if symptoms continue or worsen.  - BRAT diet and advance as tolerated.  - May continue taking ondansetron  as directed if Reglan  not effective. Encouraged patient to allow tablet to dissolve under her tongue.     Debby CHRISTELLA Borer, RN FNP Student  "

## 2024-12-10 NOTE — Patient Instructions (Addendum)
 Eat a bland BRAT diet (bananas, rice, applesauce, and toast). Advance your diet as tolerated.   We will start you on Reglan  (metoclopramide ) every 8 hours as needed for nausea and vomiting. Continue taking Zofran  (ondansetron ) as directed if needed for nausea and vomiting.  Use Pedialyte popsicles to increase hydration.  Return to the office early next week if no better.

## 2024-12-12 ENCOUNTER — Other Ambulatory Visit: Payer: Self-pay | Admitting: Family Medicine

## 2024-12-26 ENCOUNTER — Other Ambulatory Visit: Payer: Self-pay | Admitting: Adult Health

## 2024-12-26 DIAGNOSIS — R110 Nausea: Secondary | ICD-10-CM

## 2025-09-09 ENCOUNTER — Ambulatory Visit
